# Patient Record
Sex: Female | Born: 1940 | Race: White | Hispanic: No | Smoking: Former smoker
Health system: Southern US, Community
[De-identification: ages and names within clinical notes are randomized; demographics above are authoritative.]

## PROBLEM LIST (undated history)

## (undated) DIAGNOSIS — E039 Hypothyroidism, unspecified: Secondary | ICD-10-CM

## (undated) DIAGNOSIS — H409 Unspecified glaucoma: Secondary | ICD-10-CM

## (undated) DIAGNOSIS — F419 Anxiety disorder, unspecified: Secondary | ICD-10-CM

## (undated) DIAGNOSIS — Z9889 Other specified postprocedural states: Secondary | ICD-10-CM

## (undated) DIAGNOSIS — I10 Essential (primary) hypertension: Secondary | ICD-10-CM

## (undated) DIAGNOSIS — R112 Nausea with vomiting, unspecified: Secondary | ICD-10-CM

## (undated) DIAGNOSIS — M1990 Unspecified osteoarthritis, unspecified site: Secondary | ICD-10-CM

## (undated) DIAGNOSIS — E785 Hyperlipidemia, unspecified: Secondary | ICD-10-CM

## (undated) DIAGNOSIS — K219 Gastro-esophageal reflux disease without esophagitis: Secondary | ICD-10-CM

## (undated) DIAGNOSIS — I6529 Occlusion and stenosis of unspecified carotid artery: Secondary | ICD-10-CM

## (undated) HISTORY — PX: CATARACT EXTRACTION W/ INTRAOCULAR LENS  IMPLANT, BILATERAL: SHX1307

## (undated) HISTORY — DX: Unspecified osteoarthritis, unspecified site: M19.90

## (undated) HISTORY — PX: BREAST BIOPSY: SHX20

## (undated) HISTORY — DX: Essential (primary) hypertension: I10

## (undated) HISTORY — PX: NECK MASS EXCISION: SHX2079

## (undated) HISTORY — PX: RHINOPLASTY: SUR1284

## (undated) HISTORY — PX: THYROIDECTOMY: SHX17

## (undated) HISTORY — DX: Unspecified glaucoma: H40.9

## (undated) HISTORY — DX: Hypothyroidism, unspecified: E03.9

## (undated) HISTORY — DX: Occlusion and stenosis of unspecified carotid artery: I65.29

## (undated) HISTORY — DX: Hyperlipidemia, unspecified: E78.5

## (undated) HISTORY — PX: TONSILLECTOMY: SUR1361

## (undated) HISTORY — DX: Gastro-esophageal reflux disease without esophagitis: K21.9

## (undated) HISTORY — PX: FRACTURE SURGERY: SHX138

---

## 1999-04-23 ENCOUNTER — Ambulatory Visit (HOSPITAL_COMMUNITY): Admission: RE | Admit: 1999-04-23 | Discharge: 1999-04-23 | Payer: Self-pay | Admitting: Neurosurgery

## 1999-04-23 ENCOUNTER — Encounter: Payer: Self-pay | Admitting: Neurosurgery

## 1999-05-10 ENCOUNTER — Encounter: Payer: Self-pay | Admitting: Neurosurgery

## 1999-05-10 ENCOUNTER — Ambulatory Visit (HOSPITAL_COMMUNITY): Admission: RE | Admit: 1999-05-10 | Discharge: 1999-05-10 | Payer: Self-pay | Admitting: Neurosurgery

## 1999-05-24 ENCOUNTER — Encounter: Payer: Self-pay | Admitting: Neurosurgery

## 1999-05-24 ENCOUNTER — Ambulatory Visit (HOSPITAL_COMMUNITY): Admission: RE | Admit: 1999-05-24 | Discharge: 1999-05-24 | Payer: Self-pay | Admitting: Neurosurgery

## 2000-05-05 ENCOUNTER — Encounter: Payer: Self-pay | Admitting: Neurosurgery

## 2000-05-05 ENCOUNTER — Ambulatory Visit (HOSPITAL_COMMUNITY): Admission: RE | Admit: 2000-05-05 | Discharge: 2000-05-05 | Payer: Self-pay | Admitting: Neurosurgery

## 2000-05-14 ENCOUNTER — Encounter: Payer: Self-pay | Admitting: Neurosurgery

## 2000-05-16 ENCOUNTER — Encounter: Payer: Self-pay | Admitting: Neurosurgery

## 2000-05-17 ENCOUNTER — Inpatient Hospital Stay (HOSPITAL_COMMUNITY): Admission: RE | Admit: 2000-05-17 | Discharge: 2000-05-20 | Payer: Self-pay | Admitting: Neurosurgery

## 2000-06-04 ENCOUNTER — Encounter: Payer: Self-pay | Admitting: Internal Medicine

## 2000-06-04 ENCOUNTER — Ambulatory Visit (HOSPITAL_COMMUNITY): Admission: RE | Admit: 2000-06-04 | Discharge: 2000-06-04 | Payer: Self-pay | Admitting: Internal Medicine

## 2000-06-05 ENCOUNTER — Inpatient Hospital Stay (HOSPITAL_COMMUNITY): Admission: EM | Admit: 2000-06-05 | Discharge: 2000-06-08 | Payer: Self-pay | Admitting: *Deleted

## 2000-06-07 ENCOUNTER — Encounter: Payer: Self-pay | Admitting: Family Medicine

## 2000-07-15 ENCOUNTER — Encounter (HOSPITAL_COMMUNITY): Admission: RE | Admit: 2000-07-15 | Discharge: 2000-08-14 | Payer: Self-pay | Admitting: Neurosurgery

## 2000-08-12 ENCOUNTER — Other Ambulatory Visit: Admission: RE | Admit: 2000-08-12 | Discharge: 2000-08-12 | Payer: Self-pay | Admitting: Obstetrics and Gynecology

## 2000-08-15 ENCOUNTER — Encounter: Admission: RE | Admit: 2000-08-15 | Discharge: 2000-09-14 | Payer: Self-pay | Admitting: Neurosurgery

## 2000-09-16 ENCOUNTER — Encounter (HOSPITAL_COMMUNITY): Admission: RE | Admit: 2000-09-16 | Discharge: 2000-10-16 | Payer: Self-pay | Admitting: Neurosurgery

## 2000-12-05 ENCOUNTER — Encounter: Payer: Self-pay | Admitting: Family Medicine

## 2000-12-05 ENCOUNTER — Ambulatory Visit (HOSPITAL_COMMUNITY): Admission: RE | Admit: 2000-12-05 | Discharge: 2000-12-05 | Payer: Self-pay | Admitting: Family Medicine

## 2001-07-09 ENCOUNTER — Encounter: Payer: Self-pay | Admitting: Internal Medicine

## 2001-07-09 ENCOUNTER — Ambulatory Visit (HOSPITAL_COMMUNITY): Admission: RE | Admit: 2001-07-09 | Discharge: 2001-07-09 | Payer: Self-pay | Admitting: Internal Medicine

## 2001-08-20 ENCOUNTER — Encounter (HOSPITAL_COMMUNITY): Admission: RE | Admit: 2001-08-20 | Discharge: 2001-09-19 | Payer: Self-pay | Admitting: Preventative Medicine

## 2001-11-26 ENCOUNTER — Encounter: Payer: Self-pay | Admitting: Obstetrics and Gynecology

## 2001-11-26 ENCOUNTER — Ambulatory Visit (HOSPITAL_COMMUNITY): Admission: RE | Admit: 2001-11-26 | Discharge: 2001-11-26 | Payer: Self-pay | Admitting: Obstetrics and Gynecology

## 2001-12-03 ENCOUNTER — Encounter: Payer: Self-pay | Admitting: Obstetrics and Gynecology

## 2001-12-03 ENCOUNTER — Ambulatory Visit (HOSPITAL_COMMUNITY): Admission: RE | Admit: 2001-12-03 | Discharge: 2001-12-03 | Payer: Self-pay | Admitting: Obstetrics and Gynecology

## 2002-01-27 ENCOUNTER — Ambulatory Visit (HOSPITAL_COMMUNITY): Admission: RE | Admit: 2002-01-27 | Discharge: 2002-01-27 | Payer: Self-pay | Admitting: Internal Medicine

## 2002-01-27 ENCOUNTER — Encounter: Payer: Self-pay | Admitting: Internal Medicine

## 2002-04-19 ENCOUNTER — Ambulatory Visit (HOSPITAL_COMMUNITY): Admission: RE | Admit: 2002-04-19 | Discharge: 2002-04-19 | Payer: Self-pay | Admitting: Internal Medicine

## 2002-04-19 ENCOUNTER — Encounter: Payer: Self-pay | Admitting: Internal Medicine

## 2002-07-26 ENCOUNTER — Encounter: Payer: Self-pay | Admitting: Internal Medicine

## 2002-07-26 ENCOUNTER — Ambulatory Visit (HOSPITAL_COMMUNITY): Admission: RE | Admit: 2002-07-26 | Discharge: 2002-07-26 | Payer: Self-pay | Admitting: Internal Medicine

## 2003-02-15 ENCOUNTER — Ambulatory Visit (HOSPITAL_COMMUNITY): Admission: RE | Admit: 2003-02-15 | Discharge: 2003-02-15 | Payer: Self-pay | Admitting: Obstetrics and Gynecology

## 2003-03-28 ENCOUNTER — Ambulatory Visit (HOSPITAL_COMMUNITY): Admission: RE | Admit: 2003-03-28 | Discharge: 2003-03-28 | Payer: Self-pay | Admitting: Family Medicine

## 2003-07-13 ENCOUNTER — Ambulatory Visit (HOSPITAL_COMMUNITY): Admission: RE | Admit: 2003-07-13 | Discharge: 2003-07-13 | Payer: Self-pay | Admitting: Internal Medicine

## 2003-08-15 ENCOUNTER — Ambulatory Visit (HOSPITAL_COMMUNITY): Admission: RE | Admit: 2003-08-15 | Discharge: 2003-08-15 | Payer: Self-pay | Admitting: General Surgery

## 2003-08-25 ENCOUNTER — Ambulatory Visit (HOSPITAL_COMMUNITY): Admission: RE | Admit: 2003-08-25 | Discharge: 2003-08-25 | Payer: Self-pay | Admitting: Family Medicine

## 2004-03-14 ENCOUNTER — Ambulatory Visit (HOSPITAL_COMMUNITY): Admission: RE | Admit: 2004-03-14 | Discharge: 2004-03-14 | Payer: Self-pay | Admitting: Obstetrics and Gynecology

## 2004-04-04 ENCOUNTER — Ambulatory Visit (HOSPITAL_COMMUNITY): Admission: RE | Admit: 2004-04-04 | Discharge: 2004-04-04 | Payer: Self-pay | Admitting: Internal Medicine

## 2004-04-11 ENCOUNTER — Ambulatory Visit (HOSPITAL_COMMUNITY): Admission: RE | Admit: 2004-04-11 | Discharge: 2004-04-11 | Payer: Self-pay | Admitting: Internal Medicine

## 2004-04-11 ENCOUNTER — Ambulatory Visit: Payer: Self-pay | Admitting: *Deleted

## 2004-12-12 ENCOUNTER — Ambulatory Visit: Payer: Self-pay | Admitting: *Deleted

## 2005-04-01 ENCOUNTER — Ambulatory Visit (HOSPITAL_COMMUNITY): Admission: RE | Admit: 2005-04-01 | Discharge: 2005-04-01 | Payer: Self-pay | Admitting: Obstetrics and Gynecology

## 2005-05-01 ENCOUNTER — Ambulatory Visit (HOSPITAL_COMMUNITY): Admission: RE | Admit: 2005-05-01 | Discharge: 2005-05-01 | Payer: Self-pay | Admitting: Internal Medicine

## 2006-04-03 ENCOUNTER — Ambulatory Visit (HOSPITAL_COMMUNITY): Admission: RE | Admit: 2006-04-03 | Discharge: 2006-04-03 | Payer: Self-pay | Admitting: Obstetrics and Gynecology

## 2006-04-09 ENCOUNTER — Ambulatory Visit (HOSPITAL_COMMUNITY): Admission: RE | Admit: 2006-04-09 | Discharge: 2006-04-09 | Payer: Self-pay | Admitting: Internal Medicine

## 2006-12-11 ENCOUNTER — Ambulatory Visit (HOSPITAL_BASED_OUTPATIENT_CLINIC_OR_DEPARTMENT_OTHER): Admission: RE | Admit: 2006-12-11 | Discharge: 2006-12-11 | Payer: Self-pay | Admitting: Otolaryngology

## 2007-02-24 ENCOUNTER — Other Ambulatory Visit: Admission: RE | Admit: 2007-02-24 | Discharge: 2007-02-24 | Payer: Self-pay | Admitting: Obstetrics and Gynecology

## 2007-04-06 ENCOUNTER — Ambulatory Visit (HOSPITAL_COMMUNITY): Admission: RE | Admit: 2007-04-06 | Discharge: 2007-04-06 | Payer: Self-pay | Admitting: Obstetrics and Gynecology

## 2007-04-14 ENCOUNTER — Ambulatory Visit (HOSPITAL_COMMUNITY): Admission: RE | Admit: 2007-04-14 | Discharge: 2007-04-14 | Payer: Self-pay | Admitting: Internal Medicine

## 2007-04-14 ENCOUNTER — Encounter: Payer: Self-pay | Admitting: Orthopedic Surgery

## 2007-04-30 ENCOUNTER — Ambulatory Visit: Payer: Self-pay | Admitting: Orthopedic Surgery

## 2007-04-30 DIAGNOSIS — S93409A Sprain of unspecified ligament of unspecified ankle, initial encounter: Secondary | ICD-10-CM | POA: Insufficient documentation

## 2007-07-20 ENCOUNTER — Ambulatory Visit (HOSPITAL_COMMUNITY): Admission: RE | Admit: 2007-07-20 | Discharge: 2007-07-20 | Payer: Self-pay | Admitting: Internal Medicine

## 2007-09-14 ENCOUNTER — Ambulatory Visit (HOSPITAL_COMMUNITY): Admission: RE | Admit: 2007-09-14 | Discharge: 2007-09-14 | Payer: Self-pay | Admitting: Internal Medicine

## 2007-09-15 ENCOUNTER — Ambulatory Visit (HOSPITAL_COMMUNITY): Admission: RE | Admit: 2007-09-15 | Discharge: 2007-09-15 | Payer: Self-pay | Admitting: Internal Medicine

## 2008-04-06 ENCOUNTER — Ambulatory Visit (HOSPITAL_COMMUNITY): Admission: RE | Admit: 2008-04-06 | Discharge: 2008-04-06 | Payer: Self-pay | Admitting: Obstetrics and Gynecology

## 2008-08-08 ENCOUNTER — Ambulatory Visit: Payer: Self-pay | Admitting: Vascular Surgery

## 2008-08-17 ENCOUNTER — Ambulatory Visit (HOSPITAL_COMMUNITY): Admission: RE | Admit: 2008-08-17 | Discharge: 2008-08-17 | Payer: Self-pay | Admitting: Internal Medicine

## 2008-09-02 ENCOUNTER — Ambulatory Visit: Payer: Self-pay | Admitting: Vascular Surgery

## 2008-09-06 ENCOUNTER — Other Ambulatory Visit: Admission: RE | Admit: 2008-09-06 | Discharge: 2008-09-06 | Payer: Self-pay | Admitting: Obstetrics and Gynecology

## 2009-04-13 ENCOUNTER — Ambulatory Visit (HOSPITAL_COMMUNITY): Admission: RE | Admit: 2009-04-13 | Discharge: 2009-04-13 | Payer: Self-pay | Admitting: Obstetrics and Gynecology

## 2009-09-01 ENCOUNTER — Ambulatory Visit: Payer: Self-pay | Admitting: Vascular Surgery

## 2010-03-18 ENCOUNTER — Encounter: Payer: Self-pay | Admitting: Internal Medicine

## 2010-03-18 ENCOUNTER — Encounter: Payer: Self-pay | Admitting: Family Medicine

## 2010-04-23 ENCOUNTER — Other Ambulatory Visit: Payer: Self-pay | Admitting: Obstetrics and Gynecology

## 2010-04-23 DIAGNOSIS — Z139 Encounter for screening, unspecified: Secondary | ICD-10-CM

## 2010-04-26 ENCOUNTER — Ambulatory Visit (HOSPITAL_COMMUNITY)
Admission: RE | Admit: 2010-04-26 | Discharge: 2010-04-26 | Disposition: A | Discharge status: Home / Self Care | Payer: Medicare Other | Source: Ambulatory Visit | Attending: Obstetrics and Gynecology | Admitting: Obstetrics and Gynecology

## 2010-04-26 DIAGNOSIS — Z139 Encounter for screening, unspecified: Secondary | ICD-10-CM

## 2010-04-26 DIAGNOSIS — Z1231 Encounter for screening mammogram for malignant neoplasm of breast: Secondary | ICD-10-CM | POA: Insufficient documentation

## 2010-06-15 ENCOUNTER — Ambulatory Visit: Payer: Medicare Other | Admitting: Cardiology

## 2010-07-06 ENCOUNTER — Encounter: Payer: Self-pay | Admitting: Cardiology

## 2010-07-09 ENCOUNTER — Ambulatory Visit: Payer: Medicare Other | Admitting: Cardiology

## 2010-07-10 NOTE — Consult Note (Signed)
NEW PATIENT CONSULTATION   Clark, Belinda L  DOB:  20-Nov-1940                                       09/02/2008  UJWJX#:[id number]   The patient presents today for evaluation of recent finding on a  [facility] screening of left carotid stenosis.  She is a very pleasant 70-  year-old white female with no prior history of amaurosis fugax,  transient ischemic attack, or stroke.  She does have a family history of  cardiovascular disease and underwent [facility] screening showing a  moderate left carotid stenosis.  I am seeing her for further discussion.  She had undergone repeat carotid duplex in our office on 08/08/2008 and  I discussed this with her.  She is quite relatively healthy.  Her  medical problems include elevated blood pressure and elevated  cholesterol, which are treated.  She is not a diabetic.  Does not have  any history of cardiac disease.  Does have a history of a stroke in a  brother at age 80 and heart disease in her father at age 38s.   SOCIAL HISTORY:  She is married.  She is retired.  She does not smoke,  having quit in 1995.  Does not drink alcohol.   REVIEW OF SYSTEMS:  Her weight is reported at 118 pounds, her height is  5 feet 3-1/2 inches tall.  She does have a history of asthma, esophageal  reflux, and arthritis.  She has multiple medication allergies and  multiple current medications, which are listed in her chart.   PHYSICAL EXAM:  Well-developed thin white female appearing stated age of  48.  Blood pressure is 172/80, pulse 87, respirations 18.  Her radial  pulses are 2+.  She has 2+ posterior tibial pulses bilaterally.  Her  heart is regular rate and rhythm.  Chest clear bilaterally.  Carotid  arteries are without bruits bilaterally.  She is grossly intact  neurologically.   I reviewed her duplex with her.  This shows lower end of the 60-79%  range of stenosis in the left internal carotid artery.  She has no  significant stenosis in  the right internal carotid artery.  I reviewed  symptoms of left carotid stenosis with the patient and her husband, and  they know to report immediately should these occur.  Otherwise, we will  see her in 1 year and repeat duplex followup.   Larina Earthly, M.D.  Electronically Signed   TFE/MEDQ  D:  09/02/2008  T:  09/05/2008  Job:  [id number]   cc:   Madelin Rear. Sherwood Gambler, MD

## 2010-07-10 NOTE — Procedures (Signed)
CAROTID DUPLEX EXAM   INDICATION:  Carotid disease.   HISTORY:  Diabetes:  No.  Cardiac:  No.  Hypertension:  Yes.  Smoking:  Previous.  Previous Surgery:  No.  CV History:  Currently asymptomatic.  Amaurosis Fugax No, Paresthesias No, Hemiparesis No                                       RIGHT             LEFT  Brachial systolic pressure:         158               164  Brachial Doppler waveforms:         Normal            Normal  Vertebral direction of flow:        Antegrade         Antegrade  DUPLEX VELOCITIES (cm/sec)  CCA peak systolic                   103               90  ECA peak systolic                   205               99  ICA peak systolic                   87                203  ICA end diastolic                   25                50  PLAQUE MORPHOLOGY:                  Calcific          Calcific  PLAQUE AMOUNT:                      Mild              Moderate  PLAQUE LOCATION:                    ICA / ECA         ICA / ECA   IMPRESSION:  1. No hemodynamically significant stenosis of the right proximal      internal carotid artery noted.  2. Doppler velocity suggests a high end 40%-59% stenosis of the left      proximal internal carotid artery.  3. Right external carotid artery stenosis noted.  4. No significant change in the Doppler velocities of the bilateral      carotid arteries when compared to the previous exam on 08/08/2008.   ___________________________________________  Larina Earthly, M.D.   CH/MEDQ  D:  09/01/2009  T:  09/02/2009  Job:  [id number]

## 2010-07-10 NOTE — Op Note (Signed)
Belinda Clark, Belinda Clark              ACCOUNT NO.:  [id number]   MEDICAL RECORD NO.:  [id number]          PATIENT TYPE:  AMB   LOCATION:  DSC                          FACILITY:  [facility]   PHYSICIAN:  Suzanna Obey, M.D.       DATE OF BIRTH:  [birth year]   DATE OF PROCEDURE:  12/11/2006  DATE OF DISCHARGE:  12/11/2006                               OPERATIVE REPORT   PREOPERATIVE DIAGNOSIS:  Deviated septum and turbinate hypertrophy.   POSTOPERATIVE DIAGNOSIS:  Deviated septum and turbinate hypertrophy.   SURGICAL PROCEDURE:  Septoplasty and submucous resection of inferior  turbinates.   ANESTHESIA:  General.   ESTIMATED BLOOD LOSS:  Less than 5 mL.   INDICATION:  This 70 year old has had nasal obstructions, has been  refractory to medical therapy.  She was informed of the risks and  benefits as well as options.  All questions were answered and consent  was obtained.   OPERATION:  The patient was taken to the operating room and placed in  the supine position.  After adequate general endotracheal tube  anesthesia, was placed in the supine position, draped in the usual  sterile manner.  The oxymetazoline pledgets were placed into the nose in  the septum and inferior turbinates were injected with 1% lidocaine with  1:100,000 epinephrine.  A right hemitransfixion incision was performed,  raising the mucoperichondrial and ostial flap.  The cartilage was  divided about 2 cm posterior to the caudal strut and the deviated  portion of the cartilage and bone were removed with the Jansen-Middleton  forceps.  This corrected the septal deflection.  The turbinates were  infractured.  A midline incision made with a 15 blade.  The mucosal flap  elevated superiorly and the inferior mucosa and bone were removed with  the turbinate scissors.  The edge was cauterized with suction cautery  and the flap was laid back down over the raw surface and both turbinates  outfractured.  Hemitransfixion incision closed  with an interrupted 4-0  chromic and a 4-0 plain gut placed through the septum.  Telfa rolls  soaked in bacitracin were placed into the nose bilaterally and secured  with a 3-0 nylon.  The patient was awakened and brought to recovery in  stable condition, counts correct.           ______________________________  Suzanna Obey, M.D.     JB/MEDQ  D:  12/11/2006  T:  12/11/2006  Job:  [id number]

## 2010-07-10 NOTE — Procedures (Signed)
CAROTID DUPLEX EXAM   INDICATION:  Follow up [facility] screen.   HISTORY:  Diabetes:  No.  Cardiac:  No.  Hypertension:  Yes.  Smoking:  Previous.  Previous Surgery:  No.  CV History:  Asymptomatic.  Amaurosis Fugax No, Paresthesias No, Hemiparesis No.                                       RIGHT             LEFT  Brachial systolic pressure:         162               160  Brachial Doppler waveforms:         Normal            Normal  Vertebral direction of flow:        Antegrade         Antegrade  DUPLEX VELOCITIES (cm/sec)  CCA peak systolic                   97                133  ECA peak systolic                   174               119  ICA peak systolic                   79                206  ICA end diastolic                   16                67  PLAQUE MORPHOLOGY:                  Calcific          Calcific  PLAQUE AMOUNT:                      Mild              Moderate/severe  PLAQUE LOCATION:                    ICA/ECA           ICA/ECA   IMPRESSION:  1. 1-39% stenosis of the right internal carotid artery.  2. 60-79% stenosis of the left internal carotid artery.       ___________________________________________  Larina Earthly, M.D.   CH/MEDQ  D:  08/08/2008  T:  08/08/2008  Job:  [id number]   cc:   Madelin Rear. Sherwood Gambler, MD

## 2010-07-13 NOTE — Discharge Summary (Signed)
[place]. [facility]  Patient:    Belinda Clark, Belinda Clark                     MRN: [id number] Adm. Date:  [id number] Disc. 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  CLINICAL HISTORY:  The patient was admitted because of a long history of back and right leg pain.  X-rays showed that she had compromise of the L5 nerve root.  The patient wanted to go ahead with surgery.  LABORATORY DATA:  Within normal limits.  She had a white cell count which showed 10,000 with 70% neutrophils.  COURSE IN THE HOSPITAL:  The patient was taken to surgery and a right L4-5 diskectomy and foraminotomy were done.  The patient did really well and she was ready to go home, but she developed some low-grade fever.  It was decided to keep her overnight.  Today she is feeling much better.  She has been afebrile for the past 24 hours.  The white cells showed 10,000 with 70 neutrophils.  Today she has been afebrile and she is ready to go home.  CONDITION ON DISCHARGE:  Improving.  DISCHARGE MEDICATIONS:  Percocet and diazepam.  DIET:  Regular.  ACTIVITY:  Not to drive until she sees me.  FOLLOW-UP:  I will see her in my office in 10 days. DD:  05/20/00 TD:  05/20/00 Job: [id number]

## 2010-07-13 NOTE — H&P (Signed)
Belinda Clark. Belinda Clark  Patient:    Belinda Clark, Belinda Clark                     MRN: [id number] Adm. Date:  [id number] Attending:  Danella Penton                         History and Physical  HISTORY OF PRESENT ILLNESS:  Belinda Clark is a lady who was seen by me initially at the end of February 2002 because of chronic back pain with radiation down to the right leg all the way down to the right foot.  The patient had been seen by the chiropractor, had conservative treatment, and in view of no improvement we decided to go ahead with a cervical/lumbar myelogram because she was complaining of some cervical pain.  Because of the findings, we decided to go ahead with surgery.  She is not any better and yesterday I brought her in, as well as her husband, and we talked about surgery.  Both of them want to go ahead with surgery.  PAST MEDICAL HISTORY:  Two breast biopsies, thyroidectomy.  ALLERGIES:  CEFTIN, CODEINE, and SULFA.  SOCIAL HISTORY:  Patient does not smoke, does not drink.  FAMILY HISTORY:  History of diabetes, high blood pressure in the family.  REVIEW OF SYSTEMS:  Positive for a history of urinary incontinence, high blood pressure, back pain.  PHYSICAL EXAMINATION:  GENERAL:  The patient came to my office and she was limping from the right leg.  HEENT:  Normal.  NECK:  Normal.  LUNGS:  Clear.  HEART:  Heart sounds normal.  ABDOMEN:  Normal.  EXTREMITIES:  Normal pulses.  NEUROLOGIC:  Mental status normal.  Cranial nerves normal.  Strength is 5/5 except in the right foot, which she has 4/5 weakness.  There is no atrophy. Reflexes 2+, no Babinski.  Straight leg raising - left side negative at 90 degrees, right side positive at 70 degrees.  She had difficulty walking with tiptoes and heels - this is more in the right foot.  LABORATORY DATA:  The myelogram showed that, indeed, she has a ______ mostly at the level of 4-5 on the right side,  associated with a bulging disk.  CLINICAL IMPRESSION: 1. Right L5 radiculopathy secondary to ______ and a herniated disk. 2. The cervical myelogram was normal.  RECOMMENDATIONS:  The patient is being admitted for surgery.  She knows that the procedure will be a right L4 and 5 foraminotomy and probable diskectomy. She knows about the risks such as infection, CSF leak, worsening of the pain, paralysis, need of further surgery. DD:  05/16/00 TD:  05/17/00 Job: [id number]

## 2010-07-13 NOTE — Op Note (Signed)
[place]. [facility]  Patient:    Belinda Clark, Belinda Clark                       MRN: [id number] Proc. Date: 05/16/00 Attending:  Tanya Nones. Jeral Fruit, M.D.                           Operative Report  PREOPERATIVE DIAGNOSIS:  Right L5 chronic radiculopathy secondary to hypertrophied facet and herniated disk.  POSTOPERATIVE DIAGNOSIS:  Right L5 chronic radiculopathy secondary to hypertrophied facet and herniated disk.  PROCEDURES: 1. Right L4-5 diskectomy, foraminotomy, with decompression of the L4 and L5    nerve roots. 2. Lysis of adhesions. 3. Microscope.  SURGEON:  Tanya Nones. Jeral Fruit, M.D.  ASSISTANT:  Cristi Loron, M.D.  CLINICAL HISTORY:  Ms. Direnzo is a lady who has been complaining of back and right leg pain for many years.  She has weakness of the dorsiflexors of the right foot.  She has failed with conservative treatment.  X-rays showed that indeed she has a diffuse herniated disk with hypertrophy of the facet, right worse than the left one.  Surgery was advised.  The patient knew of the risks, such as infection, CSF leak, worsening of the pain, paralysis, and need for further surgery.  Also, she knew that because of the chronicity of the problem that she might not get a lot of improvement because of probable nerve compromise chronically.  DESCRIPTION OF PROCEDURE:  The patient was taken to the OR.  She was positioned in a prone manner.  The back was prepped with Betadine.  A midline incision from L4 to L5 was done.  X-rays showed that indeed we were at that area.  Then with the drill, we drilled the lower level of L4 and the upper of L5.  We investigated the facet, and the facet was twice as big.  We did a medial facetectomy, leaving plenty of facet behind.  We brought the microscope into the area, and we removed the yellow ligament.  We started doing the dissection.  What we found, indeed, was that both L4 and L5 nerve roots were narrow, were  going through narrow foramina.  The foraminotomy to decompress the L5 nerve root was done first.  Then when we reached the dural sac, we found that indeed there was a large herniated disk, but also we found quite a bit of adhesion compromising the L4 nerve root.  Because of that, we did a lysis of adhesions.  The dura mater right at the level of the takeoff of L4 was released.  After having the dissection, we were able to mobilize the thecal sac without any problem.  Indeed, there was a large herniated disk affecting the takeoff of L5.  Incision was made, and total gross diskectomy with removal of a large degenerative disk was accomplished.  Having done this, we investigated again the foramen.  There was plenty of room for the L4 and L5 nerve roots.  We did a Valsalva maneuver, although we did not see any CSF coming.  Nevertheless, we proceeded to putting a Tisseel in the arachnoid space, _____ the dural sac, the L4, and L5 nerve root.  Having done this, fat was left on top of the area.  The area was irrigated.  The wound was closed with Vicryl and nylon.  The patient did well. DD:  05/16/00 TD:  05/17/00 Job: [id number]

## 2010-07-13 NOTE — Procedures (Signed)
NAMEKOOPER, CHRISWELL NO.:  [id number]   MEDICAL RECORD NO.:  [id number]          PATIENT TYPE:  OUT   LOCATION:  DSC                           FACILITY:  [facility]   PHYSICIAN:  Vida Roller, M.D.   DATE OF BIRTH:  10/23/1940   DATE OF PROCEDURE:  04/11/2004  DATE OF DISCHARGE:                                  ECHOCARDIOGRAM   TAPE NUMBER:  [id number].   TAPE COUNT:  [id number] - [id number].   HISTORY OF PRESENT ILLNESS:  This is a 70 year old woman with chest pain and  shortness of breath.   TECHNICAL QUALITY:  Slightly difficult and the endocardial definition was  not completely adequate.   M-MODE TRACINGS:  The aorta is 27 mm.   Left atrium is 36 mm.   The septum is 10 mm.   Posterior wall is 10 mm.   Left ventricular diastolic dimension is 41 mm.   Left ventricular systolic dimension is 30 mm.   2-D AND DOPPLER IMAGING:  The left ventricle is normal size with normal  systolic function. Estimated ejection fraction 55% to 60%. There were no  obvious wall motion abnormalities although it was difficult to assess. No  left ventricular hypertrophy seen. The right ventricle is top normal in  size. There is mild right atrial enlargement. Left atrium appears to be  normal size.   The aortic valve is sclerotic with no evidence of stenosis or regurgitation.   The mitral valve has trace mitral regurgitation. No stenosis is seen.   The tricuspid valve has trace regurgitation.   Pulmonic valve not well seen.   No pericardial effusion.   Inferior vena cava appears to be normal size.   The ascending aorta not well seen.      JH/MEDQ  D:  04/12/2004  T:  04/12/2004  Job:  [id number]

## 2010-07-13 NOTE — H&P (Signed)
NAME:  Belinda Clark, Belinda Clark                        ACCOUNT NO.:  [id number]   MEDICAL RECORD NO.:  [id number]                   PATIENT TYPE:  OUT   LOCATION:  [facility]                                  FACILITY:  [facility]   PHYSICIAN:  Dalia Heading, M.D.               DATE OF BIRTH:  Feb 04, 1941   DATE OF ADMISSION:  08/11/2003  DATE OF DISCHARGE:                                HISTORY & PHYSICAL   CHIEF COMPLAINT:  Need for screening colonoscopy.   HISTORY OF PRESENT ILLNESS:  The patient is a 70 year old white female who  is referred for endoscopic evaluation.  She needs a colonoscopy for  screening purposes.  She has never had a colonoscopy.  There is no history  of abdominal pain, weight loss, nausea, vomiting, diarrhea, constipation,  melena or hematochezia.  No family history of colon carcinoma.   PAST MEDICAL HISTORY:  1. Hypothyroidism.  2. Hypertension.  3. Reflux disease.   PAST SURGICAL HISTORY:  1. Spinal surgery.  2. Multiple breast biopsies.  3. Thyroidectomy.   CURRENT MEDICATIONS:  1. Baby aspirin.  2. Actonel.  3. Synthroid.  4. Toprol.  5. Nexium.  6. Hydrochlorothiazide.  7. Vitamins.  8. Fish oil.   ALLERGIES:  CEFTIN, SULFA, CODEINE.   REVIEW OF SYSTEMS:  The patient denies drinking or smoking.   PHYSICAL EXAMINATION:  GENERAL APPEARANCE:  Well-developed, well-nourished  white female in no acute distress.  VITAL SIGNS:  Afebrile and vital signs are stable.  LUNGS:  Clear to auscultation with good breath sounds bilaterally.  HEART:  Regular rate and rhythm without S3, S4 or murmurs.  ABDOMEN:  Soft, nontender, nondistended.  No hepatosplenomegaly or masses  are noted.  RECTAL:  Deferred to the procedure.   IMPRESSION:  Need for screening colonoscopy.   PLAN:  The patient is scheduled for a colonoscopy on August 15, 2003.  Risks  and benefits of procedure including bleeding and perforation were fully  explained to the patient.  Gave informed  consent.     ___________________________________________                                         Dalia Heading, M.D.   MAJ/MEDQ  D:  08/11/2003  T:  08/11/2003  Job:  [id number]   cc:   Madelin Rear. Sherwood Gambler, M.D.  [address]  [place]  [place] [address]  Fax: [phone]

## 2010-07-26 ENCOUNTER — Ambulatory Visit: Payer: Medicare Other | Admitting: Cardiology

## 2010-07-27 ENCOUNTER — Ambulatory Visit: Payer: Medicare Other | Admitting: Cardiology

## 2010-08-28 ENCOUNTER — Ambulatory Visit: Payer: Self-pay | Admitting: Vascular Surgery

## 2010-08-28 ENCOUNTER — Other Ambulatory Visit: Payer: Self-pay

## 2010-09-18 ENCOUNTER — Encounter: Payer: Self-pay | Admitting: Cardiology

## 2010-09-21 ENCOUNTER — Ambulatory Visit: Payer: Medicare Other | Admitting: Cardiology

## 2010-09-25 ENCOUNTER — Ambulatory Visit: Payer: Self-pay | Admitting: Vascular Surgery

## 2010-09-25 ENCOUNTER — Other Ambulatory Visit: Payer: Self-pay

## 2010-10-01 ENCOUNTER — Encounter: Payer: Self-pay | Admitting: Vascular Surgery

## 2010-10-01 ENCOUNTER — Encounter: Payer: Self-pay | Admitting: Cardiology

## 2010-10-02 ENCOUNTER — Other Ambulatory Visit (INDEPENDENT_AMBULATORY_CARE_PROVIDER_SITE_OTHER): Payer: Medicare Other

## 2010-10-02 ENCOUNTER — Encounter: Payer: Self-pay | Admitting: Vascular Surgery

## 2010-10-02 ENCOUNTER — Ambulatory Visit (INDEPENDENT_AMBULATORY_CARE_PROVIDER_SITE_OTHER): Payer: Medicare Other | Admitting: Vascular Surgery

## 2010-10-02 VITALS — BP 151/83

## 2010-10-02 DIAGNOSIS — I6529 Occlusion and stenosis of unspecified carotid artery: Secondary | ICD-10-CM

## 2010-10-02 NOTE — Progress Notes (Signed)
Subjective:     Patient ID: Belinda Clark, female   DOB: [birth year], 70 y.o.   MRN: [id number]  HPI  The patient presents today for followup of her asymptomatic carotid stenosis. He is right-handed. She denies any neurologic deficits. Review of Systems No change.    Past Medical History  Diagnosis Date  . HTN (hypertension)   . GERD (gastroesophageal reflux disease)   . Hypothyroidism   . High blood pressure   . Arthritis   . Asthma   . Carotid artery stenosis     40-59 % -Left    History  Substance Use Topics  . Smoking status: Former Smoker    Quit date: 02/25/1993  . Smokeless tobacco: Never Used  . Alcohol Use: No    Family History  Problem Relation Age of Onset  . Heart disease    . Diabetes      Allergies  Allergen Reactions  . Cefuroxime Axetil   . Clarithromycin   . Cleocin (Clindamycin Hcl)   . Codeine   . Cymbalta (Duloxetine Hcl)   . Doxycycline   . Duloxetine   . Iohexol      Desc: hives   . Levofloxacin   . Metoclopramide Hcl   . Sulfonamide Derivatives     Current outpatient prescriptions:aspirin 81 MG tablet, Take 81 mg by mouth daily. , Disp: , Rfl: ;  Atorvastatin Calcium (LIPITOR PO), Take 40 mg by mouth daily. , Disp: , Rfl: ;  calcium carbonate (OS-CAL) 600 MG TABS,  , Disp: , Rfl: ;  Cholecalciferol (VITAMIN D3) 400 UNITS CAPS,  , Disp: , Rfl: ;  diltiazem (CARDIZEM) 120 MG tablet, , Disp: , Rfl: ;  ENALAPRIL MALEATE PO, Take 20 mg by mouth daily. , Disp: , Rfl:  Esomeprazole Magnesium (NEXIUM PO), 40 mg daily. , Disp: , Rfl: ;  Garlic 1000 MG CAPS, Take by mouth.  , Disp: , Rfl: ;  hydrochlorothiazide 25 MG tablet, Take 25 mg by mouth daily.  , Disp: , Rfl: ;  levothyroxine (SYNTHROID, LEVOTHROID) 100 MCG tablet, Take 100 mcg by mouth daily. , Disp: , Rfl: ;  METOPROLOL SUCCINATE PO, Take 25 mg by mouth daily. , Disp: , Rfl:  montelukast (SINGULAIR) 10 MG tablet, Take 30 mg by mouth at bedtime.  , Disp: , Rfl: ;  Multiple Vitamin  (MULTIVITAMIN) capsule,  , Disp: , Rfl: ;  Omega-3 Fatty Acids (OMEGA 3 PO),  , Disp: , Rfl:   There were no vitals filed for this visit.  There is no height or weight on file to calculate BMI.       Objective:   Physical Exam Well-developed well-nourished white female in no acute distress. Carotid artery without bruits bilaterally. 2+ radial pulses bilaterally. Neurologically intact.  carotid duplex exam: No change in the left 40-59% internal carotid artery stenosis, no significant stenosis and right carotid artery.    Assessment:     Asymptomatic moderate left internal carotid artery stenosis    Plan:     Yearly followup carotid duplex exam. The patient will notify us for any neurologic deficits.

## 2010-10-15 NOTE — Procedures (Unsigned)
CAROTID DUPLEX EXAM  INDICATION:  Carotid disease.  HISTORY: Diabetes:  No. Cardiac:  No. Hypertension:  Yes. Smoking:  Previous. Previous Surgery:  No. CV History:  Currently asymptomatic. Amaurosis Fugax No, Paresthesias No, Hemiparesis No.                                      RIGHT             LEFT Brachial systolic pressure:         148               152 Brachial Doppler waveforms:         Normal            Normal Vertebral direction of flow:        Antegrade         Antegrade DUPLEX VELOCITIES (cm/sec) CCA peak systolic                   97                99 ECA peak systolic                   132               75 ICA peak systolic                   72                169 ICA end diastolic                   28                50 PLAQUE MORPHOLOGY:                  Calcific          Calcific PLAQUE AMOUNT:                      Mild              Moderate PLAQUE LOCATION:                    ICA, ECA          ICA, ECA  IMPRESSION: 1. No hemodynamically significant stenosis of the right internal     carotid artery. 2. Left internal carotid artery velocities suggest 40% to 59%     stenosis. 3. Essentially stable in comparison to the previous examination.  ___________________________________________ Larina Earthly, M.D.  EM/MEDQ  D:  10/02/2010  T:  10/02/2010  Job:  [id number]

## 2010-12-05 LAB — BASIC METABOLIC PANEL
CO2: 28
Calcium: 9.1
GFR calc Af Amer: >60
GFR calc non Af Amer: >60
Sodium: 136

## 2010-12-05 LAB — HEPATIC FUNCTION PANEL
ALT: 18
AST: 23
Bilirubin, Direct: <0.1
Total Bilirubin: 0.5

## 2011-01-04 ENCOUNTER — Encounter: Payer: Self-pay | Admitting: Cardiology

## 2011-01-04 ENCOUNTER — Ambulatory Visit (INDEPENDENT_AMBULATORY_CARE_PROVIDER_SITE_OTHER): Payer: Medicare Other | Admitting: Cardiology

## 2011-01-04 VITALS — BP 148/70 | HR 76 | Ht 63.0 in | Wt 114.0 lb

## 2011-01-04 DIAGNOSIS — I10 Essential (primary) hypertension: Secondary | ICD-10-CM

## 2011-01-04 DIAGNOSIS — I2510 Atherosclerotic heart disease of native coronary artery without angina pectoris: Secondary | ICD-10-CM | POA: Insufficient documentation

## 2011-01-04 DIAGNOSIS — I739 Peripheral vascular disease, unspecified: Secondary | ICD-10-CM | POA: Insufficient documentation

## 2011-01-04 MED ORDER — ENALAPRIL MALEATE 20 MG PO TABS: 40.0000 mg | ORAL_TABLET | Freq: Every day | ORAL | Status: DC

## 2011-01-04 NOTE — Progress Notes (Signed)
HPI Belinda Clark referred today by Dr. Sherwood Gambler for the evaluation and management of coronary disease.  In 2005, in [place], she was told she had plaque in her arteries. It sounds like she may have had an ultrafast CT. She underwent a stress test which was negative for ischemia. I do not have those records but she is fairly certain. She also has carotid disease and is followed by Dr. Arbie Cookey on annual basis.  She has multiple cardiac risk factors including hypertension, currently on 4 different meds. She quit smoking in the past did smoke for a number of years. He has hyperlipidemia being treated with a statin. She is not diabetic. She is active and slender.  Medications reviewed and she is on a very good secondary preventative strategy.  She denies exertional chest pain or angina. She denies orthopnea, PND or edema. She denies palpitations but she's had these in the past. It isn't associated with anxiety her outside records. Past Medical History  Diagnosis Date  . HTN (hypertension)   . GERD (gastroesophageal reflux disease)   . Hypothyroidism   . High blood pressure   . Arthritis   . Asthma   . Carotid artery stenosis     40-59 % -Left    Past Surgical History  Procedure Date  . Breast biopsy     x3  . Spine surgery   . Rhinoplasty   . Thyroidectomy   . Tonsillectomy     Family History  Problem Relation Age of Onset  . Heart disease    . Diabetes      History   Social History  . Marital Status: Married    Spouse Name: N/A    Number of Children: N/A  . Years of Education: N/A   Occupational History  . Not on file.   Social History Main Topics  . Smoking status: Former Smoker    Quit date: 12/26/1993  . Smokeless tobacco: Never Used  . Alcohol Use: No  . Drug Use: No  . Sexually Active: Not on file   Other Topics Concern  . Not on file   Social History Narrative  . No narrative on file    Allergies  Allergen Reactions  . Cefuroxime Axetil   .  Clarithromycin   . Cleocin (Clindamycin Hcl)   . Codeine   . Cymbalta (Duloxetine Hcl)   . Doxycycline   . Duloxetine   . Iohexol      Desc: hives   . Levofloxacin   . Metoclopramide Hcl   . Sulfonamide Derivatives     Current Outpatient Prescriptions  Medication Sig Dispense Refill  . alendronate (FOSAMAX) 70 MG tablet weekly      . amLODipine (NORVASC) 5 MG tablet Take 5 mg by mouth daily.        . Ascorbic Acid (VITAMIN C) 1000 MG tablet Take 1,000 mg by mouth daily.        Marland Kitchen aspirin 81 MG tablet Take 81 mg by mouth daily.       . Atorvastatin Calcium (LIPITOR PO) Take 40 mg by mouth daily.       . B Complex-C (SUPER B COMPLEX PO) Take by mouth daily.        . calcium carbonate (OS-CAL) 600 MG TABS        . Cholecalciferol (VITAMIN D3) 400 UNITS CAPS        . Esomeprazole Magnesium (NEXIUM PO) 40 mg daily.       . Garlic 1000 MG  CAPS Take by mouth.        . hydrochlorothiazide 25 MG tablet Take 25 mg by mouth daily.        Marland Kitchen levothyroxine (SYNTHROID, LEVOTHROID) 100 MCG tablet Take 100 mcg by mouth daily.       Marland Kitchen METOPROLOL SUCCINATE PO Take 25 mg by mouth daily.       . montelukast (SINGULAIR) 10 MG tablet Take 30 mg by mouth at bedtime.        . Multiple Vitamin (MULTIVITAMIN) capsule        . Omega-3 Fatty Acids (OMEGA 3 PO)        . omeprazole (PRILOSEC) 20 MG capsule Take 1 tablet by mouth Daily.      Marland Kitchen PROAIR HFA 108 (90 BASE) MCG/ACT inhaler as directed.      . vitamin E 400 UNIT capsule Take 400 Units by mouth daily.        . enalapril (VASOTEC) 20 MG tablet Take 2 tablets (40 mg total) by mouth daily.  180 tablet  3    ROS Negative other than HPI.   PE General Appearance: well developed, well nourished in no acute distress HEENT: symmetrical face, PERRLA, good dentition  Neck: no JVD, thyromegaly, or adenopathy, trachea midline Chest: symmetric without deformity Cardiac: PMI non-displaced, RRR, normal S1, S2, no gallop or murmur Lung: clear to ausculation  and percussion Vascular: carotid upstrokes normal, bilateral bruits, left greater than right Abdominal: nondistended, nontender, good bowel sounds, no HSM, no bruits Extremities: no cyanosis, clubbing or edema, no sign of DVT, no varicosities  Skin: normal color, no rashes Neuro: alert and oriented x 3, non-focal Pysch: normal affect Filed Vitals:   01/04/11 1604  BP: 148/70  Pulse: 76  Height: 5\' 3"  (1.6 m)  Weight: 114 lb (51.71 kg)    EKG Normal sinus rhythm, poor R-wave progression and, no ST segment changes Labs and Studies Reviewed.   Lab Results  Component Value Date   HGB 15.2* 12/11/2006      Chemistry      Component Value Date/Time   NA 136 12/10/2006 0930   K 3.6 12/10/2006 0930   CL 102 12/10/2006 0930   CO2 28 12/10/2006 0930   BUN 14 12/10/2006 0930   CREATININE 0.50 12/10/2006 0930      Component Value Date/Time   CALCIUM 9.1 12/10/2006 0930   ALKPHOS 40 12/10/2006 0930   AST 23 12/10/2006 0930   ALT 18 12/10/2006 0930   BILITOT 0.5 12/10/2006 0930       No results found for this basename: CHOL   No results found for this basename: HDL   No results found for this basename: LDLCALC   No results found for this basename: TRIG   No results found for this basename: CHOLHDL   No results found for this basename: HGBA1C   Lab Results  Component Value Date   ALT 18 12/10/2006   AST 23 12/10/2006   ALKPHOS 40 12/10/2006   BILITOT 0.5 12/10/2006   No results found for this basename: TSH

## 2011-01-04 NOTE — Patient Instructions (Addendum)
Your physician has recommended you make the following change in your medication:   Increase Enalapril  Follow-up with Dr. Sherwood Gambler regarding your blood pressure.  Your physician recommends that you schedule a follow-up appointment in:  As needed with Dr. Daleen Squibb.

## 2011-01-08 ENCOUNTER — Other Ambulatory Visit (HOSPITAL_COMMUNITY): Payer: Self-pay | Admitting: Internal Medicine

## 2011-01-08 DIAGNOSIS — Z139 Encounter for screening, unspecified: Secondary | ICD-10-CM

## 2011-01-08 NOTE — Assessment & Plan Note (Signed)
I suspect she has inadequate control. I have increased her enalapril to 40 mg per day. He sees Dr. Sherwood Gambler early Next week for blood work. Goals and parameters outlined. I strongly encouraged her to use her cuff at home and keep her record.

## 2011-01-08 NOTE — Assessment & Plan Note (Signed)
With her history of carotid disease and multiple risk factors, she clearly has subclinical coronary artery disease. She was surprised by this. It is a major component of denial as I openly addressed with her  At the present time, aggressive secondary prevention is appropriate we talked about angina and ischemic symptoms and how to respond appropriately. She will continue to follow along with vascular surgery about her carotid disease. I will see her back p.r.n.

## 2011-01-14 ENCOUNTER — Other Ambulatory Visit (HOSPITAL_COMMUNITY): Payer: Medicare Other

## 2011-01-15 ENCOUNTER — Ambulatory Visit (HOSPITAL_COMMUNITY)
Admission: RE | Admit: 2011-01-15 | Discharge: 2011-01-15 | Disposition: A | Discharge status: Home / Self Care | Payer: Medicare Other | Source: Ambulatory Visit | Attending: Internal Medicine | Admitting: Internal Medicine

## 2011-01-15 DIAGNOSIS — Z13820 Encounter for screening for osteoporosis: Secondary | ICD-10-CM | POA: Insufficient documentation

## 2011-01-15 DIAGNOSIS — Z780 Asymptomatic menopausal state: Secondary | ICD-10-CM | POA: Insufficient documentation

## 2011-01-15 DIAGNOSIS — M818 Other osteoporosis without current pathological fracture: Secondary | ICD-10-CM | POA: Insufficient documentation

## 2011-01-15 DIAGNOSIS — Z139 Encounter for screening, unspecified: Secondary | ICD-10-CM

## 2011-05-06 ENCOUNTER — Other Ambulatory Visit: Payer: Self-pay | Admitting: Obstetrics and Gynecology

## 2011-05-06 DIAGNOSIS — Z139 Encounter for screening, unspecified: Secondary | ICD-10-CM

## 2011-05-07 ENCOUNTER — Ambulatory Visit (HOSPITAL_COMMUNITY)
Admission: RE | Admit: 2011-05-07 | Discharge: 2011-05-07 | Disposition: A | Discharge status: Home / Self Care | Payer: Medicare Other | Source: Ambulatory Visit | Attending: Obstetrics and Gynecology | Admitting: Obstetrics and Gynecology

## 2011-05-07 DIAGNOSIS — Z1231 Encounter for screening mammogram for malignant neoplasm of breast: Secondary | ICD-10-CM | POA: Insufficient documentation

## 2011-05-07 DIAGNOSIS — Z139 Encounter for screening, unspecified: Secondary | ICD-10-CM

## 2011-05-09 ENCOUNTER — Ambulatory Visit (HOSPITAL_COMMUNITY): Payer: Medicare Other

## 2011-06-30 HISTORY — PX: SPINE SURGERY: SHX786

## 2011-07-03 ENCOUNTER — Ambulatory Visit (HOSPITAL_COMMUNITY)
Admission: RE | Admit: 2011-07-03 | Discharge: 2011-07-03 | Disposition: A | Discharge status: Home / Self Care | Payer: Medicare Other | Source: Ambulatory Visit | Attending: Family Medicine | Admitting: Family Medicine

## 2011-07-03 ENCOUNTER — Other Ambulatory Visit (HOSPITAL_COMMUNITY): Payer: Self-pay | Admitting: Family Medicine

## 2011-07-03 DIAGNOSIS — M439 Deforming dorsopathy, unspecified: Secondary | ICD-10-CM | POA: Insufficient documentation

## 2011-07-03 DIAGNOSIS — M549 Dorsalgia, unspecified: Secondary | ICD-10-CM

## 2011-07-17 ENCOUNTER — Other Ambulatory Visit: Payer: Self-pay | Admitting: Neurosurgery

## 2011-07-17 DIAGNOSIS — M549 Dorsalgia, unspecified: Secondary | ICD-10-CM

## 2011-07-17 DIAGNOSIS — M5410 Radiculopathy, site unspecified: Secondary | ICD-10-CM

## 2011-07-18 ENCOUNTER — Other Ambulatory Visit: Payer: Medicare Other

## 2011-07-19 ENCOUNTER — Other Ambulatory Visit: Payer: Medicare Other

## 2011-07-19 ENCOUNTER — Other Ambulatory Visit: Payer: Self-pay | Admitting: Neurosurgery

## 2011-07-19 ENCOUNTER — Ambulatory Visit
Admission: RE | Admit: 2011-07-19 | Discharge: 2011-07-19 | Disposition: A | Discharge status: Home / Self Care | Payer: Medicare Other | Source: Ambulatory Visit | Attending: Neurosurgery | Admitting: Neurosurgery

## 2011-07-19 DIAGNOSIS — M549 Dorsalgia, unspecified: Secondary | ICD-10-CM

## 2011-07-19 DIAGNOSIS — M5410 Radiculopathy, site unspecified: Secondary | ICD-10-CM

## 2011-07-19 DIAGNOSIS — IMO0002 Reserved for concepts with insufficient information to code with codable children: Secondary | ICD-10-CM

## 2011-07-19 MED ORDER — IOHEXOL 180 MG/ML  SOLN
12.0000 mL | Freq: Once | INTRAMUSCULAR | Status: AC | PRN
  Administered 2011-07-19: 12 mL via INTRATHECAL

## 2011-07-19 NOTE — Progress Notes (Signed)
benadryl 50mg  10:00 am. Taken as pre med. jkl

## 2011-07-19 NOTE — Discharge Instructions (Signed)

## 2011-07-23 ENCOUNTER — Other Ambulatory Visit: Payer: Self-pay | Admitting: Neurosurgery

## 2011-07-23 ENCOUNTER — Ambulatory Visit
Admission: RE | Admit: 2011-07-23 | Discharge: 2011-07-23 | Disposition: A | Discharge status: Home / Self Care | Payer: Medicare Other | Source: Ambulatory Visit | Attending: Neurosurgery | Admitting: Neurosurgery

## 2011-07-23 DIAGNOSIS — IMO0002 Reserved for concepts with insufficient information to code with codable children: Secondary | ICD-10-CM

## 2011-07-23 NOTE — Progress Notes (Signed)
Patient ID: Belinda Clark, female   DOB: 1940/04/02, 71 y.o.   MRN: [id number]  Office/Outpatient New Patient  - Level II 08657  07/23/2011 11:34:00   Referring physician: Dr. Jeral Fruit   Primary care physician:  Dr. Sherwood Gambler   Reason for Consult/Chief Complaint:  Low back pain extending into the lower extremities bilaterally, right greater than left.   History of Present Illness:  The patient reports an increase in her typical chronic low back pain beginning several weeks ago.  This may be related to an episode during which she picked up by 25 pounds bag of cat litter.  She states that her pain began some time later that same day.  In addition to the low back pain, she relates most significantly pain extending into the right lower extremity in what appears to be an L4 and/or L5 distribution.  She also has some left-sided pain which extends into her left buttocks.  The patient has had a previous right L4 laminotomy.   She states her pain is greatest upon first waking in the morning and then it is relieved during the day despite activities.  It is intermittent, sometimes worse on the right and other times worse on the left. She stated there was weakness in the right lower extremity one point, which has significantly improved and near completely resolved.   Leg pain, metoprolol, hydrochlorothiazide, levo thyroxine, he now approval, now resolved, alendronate.   Over-the-counter vitamins.   Lorazepam for anxiety.   Demerol for pain.   Allergies:  X-ray dye, codeine, sulfa drugs, and Ceftin   Past Medical History:  Hypertension,   Surgical History:  Breast biopsies.  Rhinoplasty.  Thyroidectomy. Spinal surgery.   Social History:  The patient denies use of tobacco or alcohol.  She is married and lives with her husband.   Review of Systems:  The patient reports symptoms of indigestion and reflux.  The she denies other constitutional, cardiovascular, respiratory, neurologic,  musculoskeletal, psychiatric, and pain, or hematologic symptoms.   Exam:   Vitals:  Blood pressure 173/73.  Temperature 98.2.   There is no focal tenderness over the spinous process sees or within the paraspinous musculature.   Data Review:  I have reviewed the patient's CT myelogram 07/19/2011 at [facility] and MRI of the lumbar spine 07/10/2011 at [facility].  There is minimal stir signal associated with an inferior endplate compression fracture of L3.  The fracture appears mostly healed.  The myelogram demonstrates lateral recess narrowing bilaterally at L3-4, right greater than left.   IMPRESSIONS:   Inferior endplate L3 compression fracture appears mostly healed. It is unlikely that the patient would benefit significantly from the vertebroplasty at this time.  She does have significant lateral recess narrowing at L3-4 which could be the etiology of what appears to be a neurogenic type pain.   PLAN:   I do not recommend spinal augmentation for this growth and at this time.  Although there is minimal edema remaining subjacent to the endplate, the fracture is near completely healed.   She may benefit from treatment of discogenic radicular type pain.

## 2011-10-02 ENCOUNTER — Encounter: Payer: Self-pay | Admitting: Neurosurgery

## 2011-10-03 ENCOUNTER — Ambulatory Visit (INDEPENDENT_AMBULATORY_CARE_PROVIDER_SITE_OTHER): Payer: Medicare Other | Admitting: *Deleted

## 2011-10-03 ENCOUNTER — Ambulatory Visit (INDEPENDENT_AMBULATORY_CARE_PROVIDER_SITE_OTHER): Payer: Medicare Other | Admitting: Neurosurgery

## 2011-10-03 ENCOUNTER — Encounter: Payer: Self-pay | Admitting: Neurosurgery

## 2011-10-03 VITALS — BP 126/70 | HR 74 | Resp 16 | Ht 63.5 in | Wt 119.0 lb

## 2011-10-03 DIAGNOSIS — I6529 Occlusion and stenosis of unspecified carotid artery: Secondary | ICD-10-CM | POA: Insufficient documentation

## 2011-10-03 NOTE — Progress Notes (Signed)
[facility]  Carotid Office Note  CC: Annual carotid surveillance Referring Physician: Early  History of Present Illness: 71 year old patient of Dr. Arbie Cookey seen for known carotid stenosis. The patient denies signs or symptoms of CVA, TIA, amaurosis fugax or any neural deficit. The patient denies any new medical diagnoses or recent surgery. The patient is a poor historian and does appear to have significant memory problems.  Past Medical History  Diagnosis Date  . HTN (hypertension)   . GERD (gastroesophageal reflux disease)   . Hypothyroidism   . High blood pressure   . Arthritis   . Asthma   . Carotid artery stenosis     40-59 % -Left    ROS: [x]  Positive   [ ]  Denies    General: [ ]  Weight loss, [ ]  Fever, [ ]  chills Neurologic: [ ]  Dizziness, [ ]  Blackouts, [ ]  Seizure [ ]  Stroke, [ ]  "Mini stroke", [ ]  Slurred speech, [ ]  Temporary blindness; [ ]  weakness in arms or legs, [ ]  Hoarseness Cardiac: [ ]  Chest pain/pressure, [ ]  Shortness of breath at rest [ ]  Shortness of breath with exertion, [ ]  Atrial fibrillation or irregular heartbeat Vascular: [ ]  Pain in legs with walking, [ ]  Pain in legs at rest, [ ]  Pain in legs at night,  [ ]  Non-healing ulcer, [ ]  Blood clot in vein/DVT,   Pulmonary: [ ]  Home oxygen, [ ]  Productive cough, [ ]  Coughing up blood, [ ]  Asthma,  [ ]  Wheezing Musculoskeletal:  [ ]  Arthritis, [ ]  Low back pain, [ ]  Joint pain Hematologic: [ ]  Easy Bruising, [ ]  Anemia; [ ]  Hepatitis Gastrointestinal: [ ]  Blood in stool, [ ]  Gastroesophageal Reflux/heartburn, [ ]  Trouble swallowing Urinary: [ ]  chronic Kidney disease, [ ]  on HD - [ ]  MWF or [ ]  TTHS, [ ]  Burning with urination, [ ]  Difficulty urinating Skin: [ ]  Rashes, [ ]  Wounds Psychological: [ ]  Anxiety, [ ]  Depression   Social History History  Substance Use Topics  . Smoking status: Former Smoker    Quit date: 12/26/1993  . Smokeless tobacco: Never Used  . Alcohol Use:  No    Family History Family History  Problem Relation Age of Onset  . Heart disease    . Diabetes    . Diabetes Mother   . Hypertension Mother   . Heart attack Father   . Heart disease Father   . Stroke Brother   . Stroke Maternal Grandmother     Allergies  Allergen Reactions  . Cefuroxime Axetil   . Clarithromycin   . Cleocin (Clindamycin Hcl)   . Codeine   . Cymbalta (Duloxetine Hcl)   . Doxycycline   . Iohexol      Desc: hives   . Levofloxacin   . Metoclopramide Hcl   . Sulfonamide Derivatives     Current Outpatient Prescriptions  Medication Sig Dispense Refill  . amLODipine (NORVASC) 5 MG tablet Take 5 mg by mouth daily.        . Ascorbic Acid (VITAMIN C) 1000 MG tablet Take 1,000 mg by mouth daily.        Marland Kitchen aspirin 81 MG tablet Take 81 mg by mouth daily.       . Atorvastatin Calcium (LIPITOR PO) Take 40 mg by mouth daily.       Marland Kitchen azithromycin (ZITHROMAX) 250 MG tablet daily.      . B Complex-C (SUPER B COMPLEX PO) Take by  mouth daily.        . calcium carbonate (OS-CAL) 600 MG TABS        . Cholecalciferol (VITAMIN D3) 400 UNITS CAPS        . enalapril (VASOTEC) 20 MG tablet Take 2 tablets (40 mg total) by mouth daily.  180 tablet  3  . Garlic 1000 MG CAPS Take by mouth.        . hydrochlorothiazide 25 MG tablet Take 25 mg by mouth daily.        Marland Kitchen ibandronate (BONIVA) 150 MG tablet Every month.      . levothyroxine (SYNTHROID, LEVOTHROID) 100 MCG tablet Take 1.25 mcg by mouth daily.       Marland Kitchen LORazepam (ATIVAN) 0.5 MG tablet as needed.      . meperidine (DEMEROL) 50 MG tablet as needed.      Marland Kitchen METOPROLOL SUCCINATE PO Take 25 mg by mouth daily.       . montelukast (SINGULAIR) 10 MG tablet Take 30 mg by mouth at bedtime.        . Multiple Vitamin (MULTIVITAMIN) capsule        . Omega-3 Fatty Acids (OMEGA 3 PO)        . omeprazole (PRILOSEC) 20 MG capsule Take 1 tablet by mouth Daily.      Marland Kitchen PROAIR HFA 108 (90 BASE) MCG/ACT inhaler as directed.      . vitamin E  400 UNIT capsule Take 400 Units by mouth daily.        Marland Kitchen alendronate (FOSAMAX) 70 MG tablet weekly      . Esomeprazole Magnesium (NEXIUM PO) 40 mg daily.         Physical Examination  Filed Vitals:   10/03/11 1157  BP: 126/70  Pulse: 74  Resp:     Body mass index is 20.75 kg/(m^2).  General:  WDWN in NAD Gait: Normal HEENT: WNL Eyes: Pupils equal Pulmonary: normal non-labored breathing , without Rales, rhonchi,  wheezing Cardiac: RRR, without  Murmurs, rubs or gallops; Abdomen: soft, NT, no masses Skin: no rashes, ulcers noted  Vascular Exam Pulses: 3+ radial pulses bilaterally Carotid bruits: Carotid pulses to auscultation with a mild left bruit Extremities without ischemic changes, no Gangrene , no cellulitis; no open wounds;  Musculoskeletal: no muscle wasting or atrophy   Neurologic: A&O X 3; Appropriate Affect ; SENSATION: normal; MOTOR FUNCTION:  moving all extremities equally. Speech is fluent/normal  Non-Invasive Vascular Imaging CAROTID DUPLEX 10/03/2011  Right ICA 20 - 39 % stenosis Left ICA 60 - 79 % stenosis   ASSESSMENT/PLAN: Asymptomatic patient with increasing left ICA stenosis. We will follow patient up in 6 months with repeat carotid duplex. The patient's questions were encouraged and answered along with her husband. It was explained to the patient and her husband the signs and symptoms of CVA and should any of this occur she should report to the nearest emergency department.  Lauree Chandler ANP   Clinic MD: Myra Gianotti on call

## 2011-10-07 NOTE — Addendum Note (Signed)
Addended by: Sharee Pimple on: 10/07/2011 09:55 AM   Modules accepted: Orders

## 2011-12-02 ENCOUNTER — Other Ambulatory Visit (HOSPITAL_COMMUNITY): Payer: Self-pay | Admitting: Internal Medicine

## 2011-12-02 ENCOUNTER — Ambulatory Visit (HOSPITAL_COMMUNITY)
Admission: RE | Admit: 2011-12-02 | Discharge: 2011-12-02 | Disposition: A | Discharge status: Home / Self Care | Payer: Medicare Other | Source: Ambulatory Visit | Attending: Internal Medicine | Admitting: Internal Medicine

## 2011-12-02 DIAGNOSIS — S8390XA Sprain of unspecified site of unspecified knee, initial encounter: Secondary | ICD-10-CM

## 2011-12-02 DIAGNOSIS — M7989 Other specified soft tissue disorders: Secondary | ICD-10-CM | POA: Insufficient documentation

## 2011-12-02 DIAGNOSIS — M25569 Pain in unspecified knee: Secondary | ICD-10-CM | POA: Insufficient documentation

## 2011-12-31 ENCOUNTER — Other Ambulatory Visit: Payer: Self-pay | Admitting: *Deleted

## 2011-12-31 MED ORDER — ENALAPRIL MALEATE 20 MG PO TABS: 40.0000 mg | ORAL_TABLET | Freq: Every day | ORAL | Status: DC

## 2012-01-15 ENCOUNTER — Other Ambulatory Visit (HOSPITAL_COMMUNITY)
Admission: RE | Admit: 2012-01-15 | Discharge: 2012-01-15 | Disposition: A | Discharge status: Home / Self Care | Payer: Medicare Other | Source: Ambulatory Visit | Attending: Obstetrics and Gynecology | Admitting: Obstetrics and Gynecology

## 2012-01-15 DIAGNOSIS — Z1151 Encounter for screening for human papillomavirus (HPV): Secondary | ICD-10-CM | POA: Insufficient documentation

## 2012-01-15 DIAGNOSIS — Z01419 Encounter for gynecological examination (general) (routine) without abnormal findings: Secondary | ICD-10-CM | POA: Insufficient documentation

## 2012-03-31 ENCOUNTER — Ambulatory Visit: Payer: Medicare Other | Admitting: Neurosurgery

## 2012-03-31 ENCOUNTER — Ambulatory Visit: Payer: Medicare Other | Admitting: Vascular Surgery

## 2012-03-31 ENCOUNTER — Other Ambulatory Visit: Payer: Medicare Other

## 2012-04-13 ENCOUNTER — Encounter: Payer: Self-pay | Admitting: Vascular Surgery

## 2012-04-14 ENCOUNTER — Other Ambulatory Visit (INDEPENDENT_AMBULATORY_CARE_PROVIDER_SITE_OTHER): Payer: Medicare Other | Admitting: *Deleted

## 2012-04-14 ENCOUNTER — Ambulatory Visit (INDEPENDENT_AMBULATORY_CARE_PROVIDER_SITE_OTHER): Payer: Medicare Other | Admitting: Vascular Surgery

## 2012-04-14 ENCOUNTER — Encounter: Payer: Self-pay | Admitting: Vascular Surgery

## 2012-04-14 VITALS — BP 127/48 | HR 82 | Ht 63.5 in | Wt 116.3 lb

## 2012-04-14 DIAGNOSIS — I6529 Occlusion and stenosis of unspecified carotid artery: Secondary | ICD-10-CM

## 2012-04-14 NOTE — Progress Notes (Signed)
Patient has today for continued followup of asymptomatic carotid disease. She is a pleasant 72 year old female with a symptomatic carotid disease which is being followed in our office with serial ultrasound evaluation. He remains in stable health. She specifically denies any episodes of amaurosis fugax, transient ischemic attack or stroke.  Past Medical History  Diagnosis Date  . HTN (hypertension)   . GERD (gastroesophageal reflux disease)   . Hypothyroidism   . High blood pressure   . Arthritis   . Asthma   . Carotid artery stenosis     40-59 % -Left    History  Substance Use Topics  . Smoking status: Former Smoker    Quit date: 12/26/1993  . Smokeless tobacco: Never Used  . Alcohol Use: No    Family History  Problem Relation Age of Onset  . Heart disease    . Diabetes    . Diabetes Mother   . Hypertension Mother   . Hyperlipidemia Mother   . Heart attack Father   . Heart disease Father   . Hyperlipidemia Father   . Stroke Brother   . Heart disease Brother   . Heart attack Brother   . Stroke Maternal Grandmother     Allergies  Allergen Reactions  . Cefuroxime Axetil   . Clarithromycin   . Cleocin (Clindamycin Hcl)   . Codeine   . Cymbalta (Duloxetine Hcl)   . Doxycycline   . Iohexol      Desc: hives   . Levofloxacin   . Metoclopramide Hcl   . Sulfonamide Derivatives     Current outpatient prescriptions:amLODipine (NORVASC) 5 MG tablet, Take 5 mg by mouth daily.  , Disp: , Rfl: ;  Ascorbic Acid (VITAMIN C) 1000 MG tablet, Take 1,000 mg by mouth daily.  , Disp: , Rfl: ;  aspirin 81 MG tablet, Take 81 mg by mouth daily. , Disp: , Rfl: ;  Atorvastatin Calcium (LIPITOR PO), Take 40 mg by mouth daily. , Disp: , Rfl: ;  azithromycin (ZITHROMAX) 250 MG tablet, as needed. , Disp: , Rfl:  B Complex-C (SUPER B COMPLEX PO), Take by mouth daily.  , Disp: , Rfl: ;  calcium carbonate (OS-CAL) 600 MG TABS,  , Disp: , Rfl: ;  Cholecalciferol (VITAMIN D3) 400 UNITS CAPS,  ,  Disp: , Rfl: ;  enalapril (VASOTEC) 20 MG tablet, Take 2 tablets (40 mg total) by mouth daily., Disp: 180 tablet, Rfl: 3;  Garlic 1000 MG CAPS, Take by mouth.  , Disp: , Rfl: ;  hydrochlorothiazide 25 MG tablet, Take 25 mg by mouth daily.  , Disp: , Rfl:  ibandronate (BONIVA) 150 MG tablet, Every month., Disp: , Rfl: ;  levothyroxine (SYNTHROID, LEVOTHROID) 100 MCG tablet, Take 1.25 mcg by mouth daily. , Disp: , Rfl: ;  LORazepam (ATIVAN) 0.5 MG tablet, as needed., Disp: , Rfl: ;  meperidine (DEMEROL) 50 MG tablet, as needed., Disp: , Rfl: ;  METOPROLOL SUCCINATE PO, Take 25 mg by mouth daily. , Disp: , Rfl: ;  montelukast (SINGULAIR) 10 MG tablet, Take 30 mg by mouth at bedtime.  , Disp: , Rfl:  Multiple Vitamin (MULTIVITAMIN) capsule,  , Disp: , Rfl: ;  Omega-3 Fatty Acids (OMEGA 3 PO),  , Disp: , Rfl: ;  omeprazole (PRILOSEC) 20 MG capsule, Take 1 tablet by mouth Daily., Disp: , Rfl: ;  PROAIR HFA 108 (90 BASE) MCG/ACT inhaler, as directed., Disp: , Rfl: ;  vitamin E 400 UNIT capsule, Take 400 Units by mouth  daily.  , Disp: , Rfl: ;  alendronate (FOSAMAX) 70 MG tablet, weekly, Disp: , Rfl:  Esomeprazole Magnesium (NEXIUM PO), 40 mg daily. , Disp: , Rfl:   BP 127/48  Pulse 82  Ht 5' 3.5" (1.613 m)  Wt 116 lb 4.8 oz (52.753 kg)  BMI 20.28 kg/m2  SpO2 97%  Body mass index is 20.28 kg/(m^2).   Physical exam: Well developed well-nourished female in no acute distress Respirations nonlabored Neurologically she is grossly intact Pulse status 2+ radial pulses bilaterally Carotid arteries without bruits bilaterally Skin without ulcers or rashes Heart regular rate and rhythm  Invasive vascular laboratory studies in our office today revealed no change in her carotid disease. She does have 60-79% left internal carotid artery stenosis and less than 40% right internal carotid artery stenosis.  Impression and plan: Asymptomatic moderate to severe left internal carotid artery stenosis. I again reviewed  symptoms of carotid disease with amaurosis fugax transient ischemic attack and stroke. She knows to percent he emergently should this occur. Otherwise we will see her again in 6 months with continued surveillance

## 2012-04-23 ENCOUNTER — Other Ambulatory Visit: Payer: Self-pay | Admitting: *Deleted

## 2012-04-29 ENCOUNTER — Other Ambulatory Visit: Payer: Self-pay | Admitting: Obstetrics and Gynecology

## 2012-04-29 DIAGNOSIS — Z139 Encounter for screening, unspecified: Secondary | ICD-10-CM

## 2012-05-07 ENCOUNTER — Ambulatory Visit (HOSPITAL_COMMUNITY)
Admission: RE | Admit: 2012-05-07 | Discharge: 2012-05-07 | Disposition: A | Discharge status: Home / Self Care | Payer: Medicare Other | Source: Ambulatory Visit | Attending: Obstetrics and Gynecology | Admitting: Obstetrics and Gynecology

## 2012-05-07 DIAGNOSIS — Z139 Encounter for screening, unspecified: Secondary | ICD-10-CM

## 2012-05-07 DIAGNOSIS — Z1231 Encounter for screening mammogram for malignant neoplasm of breast: Secondary | ICD-10-CM | POA: Insufficient documentation

## 2012-10-07 DIAGNOSIS — R931 Abnormal findings on diagnostic imaging of heart and coronary circulation: Secondary | ICD-10-CM

## 2012-10-07 HISTORY — DX: Abnormal findings on diagnostic imaging of heart and coronary circulation: R93.1

## 2012-10-19 ENCOUNTER — Encounter: Payer: Self-pay | Admitting: Vascular Surgery

## 2012-10-20 ENCOUNTER — Ambulatory Visit (INDEPENDENT_AMBULATORY_CARE_PROVIDER_SITE_OTHER): Payer: Self-pay | Admitting: Vascular Surgery

## 2012-10-20 ENCOUNTER — Other Ambulatory Visit (INDEPENDENT_AMBULATORY_CARE_PROVIDER_SITE_OTHER): Payer: Medicare Other | Admitting: *Deleted

## 2012-10-20 ENCOUNTER — Encounter: Payer: Self-pay | Admitting: Vascular Surgery

## 2012-10-20 DIAGNOSIS — I6529 Occlusion and stenosis of unspecified carotid artery: Secondary | ICD-10-CM

## 2012-10-20 NOTE — Progress Notes (Signed)
Patient presents today for followup of her asymptomatic carotid disease. She has known moderate to severe bilateral stenosis. She today specifically denies any new episodes of amaurosis fugax transient ischemic attack or stroke. She is quite concerned in that she had a CT coronary screen which suggested a high calcium score and this suggests some potential cardiac disease. She did have a Cardiolite in 2005 which showed no evidence of reversible ischemia or scar. I had a long discussion with the patient and her husband explaining that she does have a lot of calcified plaque in her carotid arteries as well at this is not necessarily mean that there is any flow limiting issues. She is extremely active and very health conscious. He has no cardiac symptoms. She is to have a Cardiolite screen and I feel this is appropriate to put her mind that he since it's been 9 years.  Past Medical History  Diagnosis Date  . HTN (hypertension)   . GERD (gastroesophageal reflux disease)   . Hypothyroidism   . High blood pressure   . Arthritis   . Asthma   . Carotid artery stenosis     40-59 % -Left    History  Substance Use Topics  . Smoking status: Former Smoker    Quit date: 12/26/1993  . Smokeless tobacco: Never Used  . Alcohol Use: No    Family History  Problem Relation Age of Onset  . Heart disease    . Diabetes    . Diabetes Mother   . Hypertension Mother   . Hyperlipidemia Mother   . Heart attack Father   . Heart disease Father   . Hyperlipidemia Father   . Stroke Brother   . Heart disease Brother   . Heart attack Brother   . Hyperlipidemia Brother   . Stroke Maternal Grandmother     Allergies  Allergen Reactions  . Cefuroxime Axetil   . Clarithromycin   . Cleocin [Clindamycin Hcl]   . Codeine   . Cymbalta [Duloxetine Hcl]   . Doxycycline   . Iohexol      Desc: hives   . Levofloxacin   . Metoclopramide Hcl   . Sulfonamide Derivatives     Current outpatient  prescriptions:amLODipine (NORVASC) 5 MG tablet, Take 5 mg by mouth daily.  , Disp: , Rfl: ;  Ascorbic Acid (VITAMIN C) 1000 MG tablet, Take 1,000 mg by mouth daily.  , Disp: , Rfl: ;  aspirin 81 MG tablet, Take 81 mg by mouth daily. , Disp: , Rfl: ;  Atorvastatin Calcium (LIPITOR PO), Take 40 mg by mouth daily. , Disp: , Rfl: ;  azithromycin (ZITHROMAX) 250 MG tablet, as needed. , Disp: , Rfl:  B Complex-C (SUPER B COMPLEX PO), Take by mouth daily.  , Disp: , Rfl: ;  calcium carbonate (OS-CAL) 600 MG TABS,  , Disp: , Rfl: ;  Cholecalciferol (VITAMIN D3) 400 UNITS CAPS,  , Disp: , Rfl: ;  enalapril (VASOTEC) 20 MG tablet, Take 2 tablets (40 mg total) by mouth daily., Disp: 180 tablet, Rfl: 3;  Esomeprazole Magnesium (NEXIUM PO), 40 mg daily. , Disp: , Rfl: ;  Garlic 1000 MG CAPS, Take by mouth.  , Disp: , Rfl:  hydrochlorothiazide 25 MG tablet, Take 25 mg by mouth daily.  , Disp: , Rfl: ;  ibandronate (BONIVA) 150 MG tablet, Every month., Disp: , Rfl: ;  levothyroxine (SYNTHROID, LEVOTHROID) 100 MCG tablet, Take 1.25 mcg by mouth daily. , Disp: , Rfl: ;  LORazepam (ATIVAN)  0.5 MG tablet, as needed., Disp: , Rfl: ;  meperidine (DEMEROL) 50 MG tablet, as needed., Disp: , Rfl: ;  METOPROLOL SUCCINATE PO, Take 25 mg by mouth daily. , Disp: , Rfl:  montelukast (SINGULAIR) 10 MG tablet, Take 30 mg by mouth at bedtime.  , Disp: , Rfl: ;  Multiple Vitamin (MULTIVITAMIN) capsule,  , Disp: , Rfl: ;  Omega-3 Fatty Acids (OMEGA 3 PO),  , Disp: , Rfl: ;  omeprazole (PRILOSEC) 20 MG capsule, Take 1 tablet by mouth Daily., Disp: , Rfl: ;  PROAIR HFA 108 (90 BASE) MCG/ACT inhaler, as directed., Disp: , Rfl: ;  vitamin E 400 UNIT capsule, Take 400 Units by mouth daily.  , Disp: , Rfl:  alendronate (FOSAMAX) 70 MG tablet, weekly, Disp: , Rfl:   BP 142/70  Pulse 81  Ht 5' 3.5" (1.613 m)  Wt 114 lb (51.71 kg)  BMI 19.87 kg/m2  SpO2 100%  Body mass index is 19.87 kg/(m^2).   Physical exam: Well-developed well-nourished  white female no acute distress Carotid arteries without bruits bilaterally Pulse status 2+ radial 2+ femoral and 2+ dorsalis pedis pulses bilaterally Respirations equal and nonlabored Heart regular rate and rhythm Neurologically she is grossly intact  Carotid duplex today was reviewed by myself and discussed this with the patient. This does show 60-79% right internal carotid artery stenosis and 40-50% left internal carotid artery stenosis. This is unchanged from her prior exams.  Impression and plan asymptomatic carotid disease. The patient will see [place] again in 6 months with repeat carotid duplex for followup. She knows to notify us immediate should she develop any neurologic deficits

## 2012-11-04 ENCOUNTER — Ambulatory Visit (INDEPENDENT_AMBULATORY_CARE_PROVIDER_SITE_OTHER): Payer: Medicare Other | Admitting: Cardiovascular Disease

## 2012-11-04 ENCOUNTER — Encounter: Payer: Self-pay | Admitting: Cardiovascular Disease

## 2012-11-04 VITALS — BP 152/72 | HR 80 | Ht 62.5 in | Wt 114.2 lb

## 2012-11-04 DIAGNOSIS — I10 Essential (primary) hypertension: Secondary | ICD-10-CM

## 2012-11-04 DIAGNOSIS — I2510 Atherosclerotic heart disease of native coronary artery without angina pectoris: Secondary | ICD-10-CM

## 2012-11-04 DIAGNOSIS — E785 Hyperlipidemia, unspecified: Secondary | ICD-10-CM | POA: Insufficient documentation

## 2012-11-04 NOTE — Assessment & Plan Note (Signed)
Well-controlled on current medications 

## 2012-11-04 NOTE — Assessment & Plan Note (Signed)
Patient has positive cardiac risk factors and had a coronary calcium score  performed in [place] 10/07/12 that was 1483 suggesting the possibility of coronary artery disease though she is completely asymptomatic. She does have a strong family history of heart disease with a father that died at age 70 of an MI in a brother who had an MI at 84 and bypass surgery. I do not think she needs a functional study since she has no symptoms.

## 2012-11-04 NOTE — Assessment & Plan Note (Signed)
On statin therapy with recent lipid profile but a little pressure 160, LDL of 79 and HDL of 67.

## 2012-11-04 NOTE — Patient Instructions (Addendum)
Your physician wants you to follow-up in: 6 months with Dr Berry. You will receive a reminder letter in the mail two months in advance. If you don't receive a letter, please call our office to schedule the follow-up appointment.  

## 2012-11-04 NOTE — Progress Notes (Signed)
11/04/2012 Belinda Clark   12/14/1940  [id number]  Primary Physician Cassell Smiles., MD Primary Cardiologist: Runell Gess MD Roseanne Reno   HPI:  Belinda Clark is a 72 year old thin appearing married Caucasian female mother of 1 child who is referred by Dr. Carlena Sax for cardiovascular evaluation. Her crit [place] positive for 30 pack years of tobacco abuse having quit 20 years ago. History of hypertension and hyperlipidemia. She has a strong family history for disease with a father who died of an MI at age 15 a brother who had an MI in bypass surgery at age 75. She's never had a heart attack or stroke and denies chest pain or shortness of breath. She is fairly active Dances on a weekly basis without symptoms. She had a coronary calcium score performed in [place] last month that was significantly abnormal with a calcium score of 1483. Because of this she was referred for cardiovascular evaluation.   Current Outpatient Prescriptions  Medication Sig Dispense Refill  . amLODipine (NORVASC) 5 MG tablet Take 5 mg by mouth daily.        . Ascorbic Acid (VITAMIN C) 1000 MG tablet Take 1,000 mg by mouth daily.        Marland Kitchen aspirin 81 MG tablet Take 81 mg by mouth daily.       . Atorvastatin Calcium (LIPITOR PO) Take 40 mg by mouth daily.       . B Complex-C (SUPER B COMPLEX PO) Take 1 tablet by mouth daily.       . calcium carbonate (OS-CAL) 600 MG TABS Take 600 mg in the morning and 1,200 mg at night      . Cholecalciferol 1000 UNITS capsule Take 1,000 Units by mouth daily.      . enalapril (VASOTEC) 20 MG tablet Take 2 tablets (40 mg total) by mouth daily.  180 tablet  3  . Garlic 1000 MG CAPS Take 1,000 mg by mouth daily.       . hydrochlorothiazide 25 MG tablet Take 25 mg by mouth daily.        Marland Kitchen ibandronate (BONIVA) 150 MG tablet Take 150 mg by mouth every 30 (thirty) days.       Marland Kitchen levothyroxine (SYNTHROID, LEVOTHROID) 125 MCG tablet Take 125 mcg by mouth daily before  breakfast.      . LORazepam (ATIVAN) 0.5 MG tablet Take 0.25-0.5 mg by mouth as needed.       . metoprolol succinate (TOPROL-XL) 25 MG 24 hr tablet Take 25 mg by mouth daily.      . montelukast (SINGULAIR) 10 MG tablet Take 10 mg by mouth at bedtime.       . Multiple Vitamin (MULTIVITAMIN) capsule Take 1 capsule by mouth daily.       Marland Kitchen omeprazole (PRILOSEC) 20 MG capsule Take 1 tablet by mouth Daily.      Marland Kitchen PROAIR HFA 108 (90 BASE) MCG/ACT inhaler as directed.      . vitamin E 400 UNIT capsule Take 400 Units by mouth daily.        . Omega-3 Fatty Acids (OMEGA 3 PO) Take 300 mg by mouth daily.        No current facility-administered medications for this visit.    Allergies  Allergen Reactions  . Cefuroxime Axetil   . Clarithromycin   . Cleocin [Clindamycin Hcl]   . Codeine   . Cymbalta [Duloxetine Hcl]   . Doxycycline   . Iohexol  Desc: hives   . Levofloxacin   . Metoclopramide Hcl   . Sulfonamide Derivatives     History   Social History  . Marital Status: Married    Spouse Name: N/A    Number of Children: N/A  . Years of Education: N/A   Occupational History  . Not on file.   Social History Main Topics  . Smoking status: Former Smoker -- 1.00 packs/day for 30 years    Types: Cigarettes    Quit date: 12/26/1993  . Smokeless tobacco: Never Used  . Alcohol Use: No  . Drug Use: No  . Sexual Activity: Not on file   Other Topics Concern  . Not on file   Social History Narrative  . No narrative on file     Review of Systems: General: negative for chills, fever, night sweats or weight changes.  Cardiovascular: negative for chest pain, dyspnea on exertion, edema, orthopnea, palpitations, paroxysmal nocturnal dyspnea or shortness of breath Dermatological: negative for rash Respiratory: negative for cough or wheezing Urologic: negative for hematuria Abdominal: negative for nausea, vomiting, diarrhea, bright red blood per rectum, melena, or  hematemesis Neurologic: negative for visual changes, syncope, or dizziness All other systems reviewed and are otherwise negative except as noted above.    Blood pressure 152/72, pulse 80, height 5' 2.5" (1.588 m), weight 114 lb 3.2 oz (51.801 kg).  General appearance: alert and no distress Neck: no adenopathy, no JVD, supple, symmetrical, trachea midline, thyroid not enlarged, symmetric, no tenderness/mass/nodules and soft carotid bruits bilaterally Lungs: clear to auscultation bilaterally Heart: regular rate and rhythm, S1, S2 normal, no murmur, click, rub or gallop Extremities: extremities normal, atraumatic, no cyanosis or edema  EKG normal sinus rhythm at 80 without ST or T wave changes  ASSESSMENT AND PLAN:   Coronary artery disease Patient has positive cardiac risk factors and had a coronary calcium score  performed in [place] 10/07/12 that was 1483 suggesting the possibility of coronary artery disease though she is completely asymptomatic. She does have a strong family history of heart disease with a father that died at age 25 of an MI in a brother who had an MI at 39 and bypass surgery. I do not think she needs a functional study since she has no symptoms.  Hypertension Well-controlled on current medications  Hyperlipidemia On statin therapy with recent lipid profile but a little pressure 160, LDL of 79 and HDL of 67.      Runell Gess MD [facility] 11/04/2012 5:01 PM

## 2012-11-06 ENCOUNTER — Encounter: Payer: Self-pay | Admitting: Cardiovascular Disease

## 2013-03-01 ENCOUNTER — Other Ambulatory Visit (HOSPITAL_COMMUNITY): Payer: Self-pay | Admitting: Internal Medicine

## 2013-03-01 DIAGNOSIS — Z139 Encounter for screening, unspecified: Secondary | ICD-10-CM

## 2013-03-03 ENCOUNTER — Other Ambulatory Visit (HOSPITAL_COMMUNITY): Payer: Self-pay

## 2013-03-05 ENCOUNTER — Other Ambulatory Visit (HOSPITAL_COMMUNITY): Payer: Medicare Other

## 2013-03-05 ENCOUNTER — Ambulatory Visit (HOSPITAL_COMMUNITY)
Admission: RE | Admit: 2013-03-05 | Discharge: 2013-03-05 | Disposition: A | Discharge status: Home / Self Care | Payer: Medicare Other | Source: Ambulatory Visit | Attending: Internal Medicine | Admitting: Internal Medicine

## 2013-03-05 DIAGNOSIS — Z139 Encounter for screening, unspecified: Secondary | ICD-10-CM

## 2013-03-05 DIAGNOSIS — Z13820 Encounter for screening for osteoporosis: Secondary | ICD-10-CM | POA: Insufficient documentation

## 2013-03-05 DIAGNOSIS — M810 Age-related osteoporosis without current pathological fracture: Secondary | ICD-10-CM | POA: Insufficient documentation

## 2013-03-29 ENCOUNTER — Other Ambulatory Visit: Payer: Self-pay | Admitting: Vascular Surgery

## 2013-03-29 DIAGNOSIS — I6529 Occlusion and stenosis of unspecified carotid artery: Secondary | ICD-10-CM

## 2013-04-26 ENCOUNTER — Other Ambulatory Visit: Payer: Self-pay | Admitting: Obstetrics and Gynecology

## 2013-04-26 DIAGNOSIS — Z139 Encounter for screening, unspecified: Secondary | ICD-10-CM

## 2013-04-27 ENCOUNTER — Ambulatory Visit: Payer: Medicare Other | Admitting: Vascular Surgery

## 2013-05-03 ENCOUNTER — Encounter: Payer: Self-pay | Admitting: Vascular Surgery

## 2013-05-04 ENCOUNTER — Ambulatory Visit (INDEPENDENT_AMBULATORY_CARE_PROVIDER_SITE_OTHER): Payer: Medicare Other | Admitting: Vascular Surgery

## 2013-05-04 ENCOUNTER — Encounter: Payer: Self-pay | Admitting: Vascular Surgery

## 2013-05-04 ENCOUNTER — Ambulatory Visit (HOSPITAL_COMMUNITY)
Admission: RE | Admit: 2013-05-04 | Discharge: 2013-05-04 | Disposition: A | Discharge status: Home / Self Care | Payer: Medicare Other | Source: Ambulatory Visit | Attending: Vascular Surgery | Admitting: Vascular Surgery

## 2013-05-04 VITALS — BP 144/61 | HR 79 | Resp 18 | Ht 62.5 in | Wt 112.4 lb

## 2013-05-04 DIAGNOSIS — I6529 Occlusion and stenosis of unspecified carotid artery: Secondary | ICD-10-CM

## 2013-05-04 NOTE — Progress Notes (Signed)
Here today for continued followup of asymptomatic extracranial cerebrovascular occlusive disease. She specifically denies any new neuro logically deficits specifically no aphasia, amaurosis fugax or transient ischemic attacks. He is concern regarding possible coronary disease. She mentions a high calcium score and would like to have further cardiac evaluation although she has no symptoms. She will discuss this with Dr. Gwenlyn Found. He does report some right calf discomfort that is most likely related to musculoskeletal issues. He does not appear to be typical of claudication  Past Medical History  Diagnosis Date  . HTN (hypertension)   . GERD (gastroesophageal reflux disease)   . Hypothyroidism   . High blood pressure   . Arthritis   . Asthma   . Carotid artery stenosis     40-59 % -Left  . Hyperlipidemia     History  Substance Use Topics  . Smoking status: Former Smoker -- 1.00 packs/day for 30 years    Types: Cigarettes    Quit date: 12/26/1993  . Smokeless tobacco: Never Used  . Alcohol Use: No    Family History  Problem Relation Age of Onset  . Heart disease    . Diabetes    . Diabetes Mother   . Hypertension Mother   . Hyperlipidemia Mother   . Heart attack Father   . Heart disease Father   . Hyperlipidemia Father   . Stroke Brother   . Heart disease Brother   . Heart attack Brother   . Hyperlipidemia Brother   . Stroke Maternal Grandmother     Allergies  Allergen Reactions  . Cefuroxime Axetil   . Clarithromycin   . Cleocin [Clindamycin Hcl]   . Codeine   . Cymbalta [Duloxetine Hcl]   . Doxycycline   . Iohexol      Desc: hives   . Levofloxacin   . Metoclopramide Hcl   . Sulfonamide Derivatives     Current outpatient prescriptions:amLODipine (NORVASC) 5 MG tablet, Take 5 mg by mouth daily.  , Disp: , Rfl: ;  Ascorbic Acid (VITAMIN C) 1000 MG tablet, Take 1,000 mg by mouth daily.  , Disp: , Rfl: ;  aspirin 81 MG tablet, Take 81 mg by mouth daily. , Disp: , Rfl:  ;  Atorvastatin Calcium (LIPITOR PO), Take 40 mg by mouth daily. , Disp: , Rfl: ;  B Complex-C (SUPER B COMPLEX PO), Take 1 tablet by mouth daily. , Disp: , Rfl:  calcium carbonate (OS-CAL) 600 MG TABS, Take 600 mg in the morning and 1,200 mg at night, Disp: , Rfl: ;  Cholecalciferol 1000 UNITS capsule, Take 1,000 Units by mouth daily., Disp: , Rfl: ;  Garlic 2831 MG CAPS, Take 1,000 mg by mouth daily. , Disp: , Rfl: ;  hydrochlorothiazide 25 MG tablet, Take 25 mg by mouth daily.  , Disp: , Rfl: ;  ibandronate (BONIVA) 150 MG tablet, Take 150 mg by mouth every 30 (thirty) days. , Disp: , Rfl:  levothyroxine (SYNTHROID, LEVOTHROID) 125 MCG tablet, Take 125 mcg by mouth daily before breakfast., Disp: , Rfl: ;  LORazepam (ATIVAN) 0.5 MG tablet, Take 0.25-0.5 mg by mouth as needed. , Disp: , Rfl: ;  metoprolol succinate (TOPROL-XL) 25 MG 24 hr tablet, Take 25 mg by mouth daily., Disp: , Rfl: ;  montelukast (SINGULAIR) 10 MG tablet, Take 10 mg by mouth at bedtime. , Disp: , Rfl:  Multiple Vitamin (MULTIVITAMIN) capsule, Take 1 capsule by mouth daily. , Disp: , Rfl: ;  Omega-3 Fatty Acids (OMEGA 3 PO),  Take 300 mg by mouth daily. , Disp: , Rfl: ;  omeprazole (PRILOSEC) 20 MG capsule, Take 1 tablet by mouth Daily., Disp: , Rfl: ;  PROAIR HFA 108 (90 BASE) MCG/ACT inhaler, as directed., Disp: , Rfl: ;  vitamin E 400 UNIT capsule, Take 400 Units by mouth daily.  , Disp: , Rfl:  enalapril (VASOTEC) 20 MG tablet, Take 2 tablets (40 mg total) by mouth daily., Disp: 180 tablet, Rfl: 3  BP 144/61  Pulse 79  Resp 18  Ht 5' 2.5" (1.588 m)  Wt 112 lb 6.4 oz (50.984 kg)  BMI 20.22 kg/m2  Body mass index is 20.22 kg/(m^2).       On physical exam: Well-developed well-nourished female in no acute distress I do not hear any carotid bruits 2+ radial and 2+ dorsalis pedis pulses bilaterally Abdomen soft nontender no masses noted Heart regular rate and rhythm without murmur Neurologically she is grossly  intact  Carotid duplex today reveals significant calcified plaque bilaterally. She does not have any evidence of critical stenosis with no change from her prior exam from 6 months ago. Maximal velocity suggestion is a 60-79% on the left  Impression and plan: Stable asymptomatic carotid disease. Again reviewed symptoms with the patient and her husband present. Recommended a six-month followup duplex ultrasound. She will notify us immediately should she have symptoms from her carotid disease

## 2013-05-04 NOTE — Addendum Note (Signed)
Addended by: Dorthula Rue L on: 05/04/2013 03:19 PM   Modules accepted: Orders

## 2013-05-28 ENCOUNTER — Ambulatory Visit (HOSPITAL_COMMUNITY): Payer: Medicare Other

## 2013-05-31 ENCOUNTER — Inpatient Hospital Stay (HOSPITAL_COMMUNITY): Admission: RE | Admit: 2013-05-31 | Payer: Medicare Other | Source: Ambulatory Visit

## 2013-06-03 ENCOUNTER — Ambulatory Visit (HOSPITAL_COMMUNITY)
Admission: RE | Admit: 2013-06-03 | Discharge: 2013-06-03 | Disposition: A | Discharge status: Home / Self Care | Payer: Medicare Other | Source: Ambulatory Visit | Attending: Obstetrics and Gynecology | Admitting: Obstetrics and Gynecology

## 2013-06-03 DIAGNOSIS — Z1231 Encounter for screening mammogram for malignant neoplasm of breast: Secondary | ICD-10-CM | POA: Insufficient documentation

## 2013-06-03 DIAGNOSIS — Z139 Encounter for screening, unspecified: Secondary | ICD-10-CM

## 2013-06-28 ENCOUNTER — Ambulatory Visit (HOSPITAL_COMMUNITY)
Admission: RE | Admit: 2013-06-28 | Discharge: 2013-06-28 | Disposition: A | Discharge status: Home / Self Care | Payer: Medicare Other | Source: Ambulatory Visit | Attending: Internal Medicine | Admitting: Internal Medicine

## 2013-06-28 ENCOUNTER — Other Ambulatory Visit (HOSPITAL_COMMUNITY): Payer: Self-pay | Admitting: Internal Medicine

## 2013-06-28 DIAGNOSIS — R059 Cough, unspecified: Secondary | ICD-10-CM

## 2013-06-28 DIAGNOSIS — R05 Cough: Secondary | ICD-10-CM | POA: Insufficient documentation

## 2013-06-28 DIAGNOSIS — R918 Other nonspecific abnormal finding of lung field: Secondary | ICD-10-CM | POA: Insufficient documentation

## 2013-07-01 ENCOUNTER — Other Ambulatory Visit (HOSPITAL_COMMUNITY): Payer: Self-pay | Admitting: Internal Medicine

## 2013-07-01 DIAGNOSIS — R911 Solitary pulmonary nodule: Secondary | ICD-10-CM

## 2013-07-02 ENCOUNTER — Ambulatory Visit (HOSPITAL_COMMUNITY): Payer: Medicare Other

## 2013-07-02 ENCOUNTER — Ambulatory Visit (HOSPITAL_COMMUNITY)
Admission: RE | Admit: 2013-07-02 | Discharge: 2013-07-02 | Disposition: A | Discharge status: Home / Self Care | Payer: Medicare Other | Source: Ambulatory Visit | Attending: Internal Medicine | Admitting: Internal Medicine

## 2013-07-02 DIAGNOSIS — R079 Chest pain, unspecified: Secondary | ICD-10-CM | POA: Insufficient documentation

## 2013-07-02 DIAGNOSIS — I2510 Atherosclerotic heart disease of native coronary artery without angina pectoris: Secondary | ICD-10-CM | POA: Insufficient documentation

## 2013-07-02 DIAGNOSIS — R911 Solitary pulmonary nodule: Secondary | ICD-10-CM

## 2013-09-17 ENCOUNTER — Encounter (HOSPITAL_COMMUNITY): Payer: Self-pay | Admitting: Pharmacy Technician

## 2013-09-17 NOTE — H&P (Signed)
  NTS SOAP Note  Vital Signs:  Vitals as of: 1/44/3154: Systolic 008: Diastolic 74: Heart Rate 77: Temp 98.66F: Height 31ft 3in: Weight 111Lbs 0 Ounces: BMI 19.66  BMI : 19.66 kg/m2  Subjective: This 73 Years 9 Months old Female presents for a screening TCS.  Last had a colonoscopy over ten years.  Is having some mucosy discharge per rectum.  No family h/o colon cancer.  No blood per rectum.   Review of Symptoms:  Constitutional:unremarkable   Head:unremarkable    Eyes:unremarkable   sinus  Cardiovascular:  unremarkable   Respiratory:unremarkable   Gastrointestin    heartburn Genitourinary:unremarkable       usc...back and neck pain dry Hematolgic/Lymphatic:unremarkable       hay fever   Past Medical History:    Reviewed  Past Medical History  Surgical History: breast biopsies, thyroidectomy, nasal surgeries, spine surgery Medical Problems: hypothyroidism, anxiety, HTN, depressant Allergies: nkda Medications: macrobid, atorvastatin, metoprolol, enalapril, synthroid, levothyroxine, vimovo, carafateHCTZ, singulair, xopenix, baby asa   Social History:Reviewed  Social History  Preferred Language: English Race:  White Ethnicity: Not Hispanic / Latino Age: 73 Years 0 Months Marital Status:  M Alcohol: no   Smoking Status: Never smoker reviewed on 09/16/2013 Functional Status reviewed on 09/16/2013 ------------------------------------------------ Bathing: Normal Cooking: Normal Dressing: Normal Driving: Normal Eating: Normal Managing Meds: Normal Oral Care: Normal Shopping: Normal Toileting: Normal Transferring: Normal Walking: Normal Cognitive Status reviewed on 09/16/2013 ------------------------------------------------ Attention: Normal Decision Making: Normal Language: Normal Memory: Normal Motor: Normal Perception: Normal Problem Solving: Normal Visual and Spatial: Normal   Family History:   Reviewed  Family Health History Mother, Deceased; History Unknown Father, Deceased; Healthy;     Objective Information: General:  Well appearing, well nourished in no distress. Skin:     no rash or prominent lesions Heart:  RRR, no murmur or gallop.  Normal S1, S2.  No S3, S4.  Lungs:    CTA bilaterally, no wheezes, rhonchi, rales.  Breathing unlabored. Abdomen:Soft, NT/ND, no HSM, no masses.   deferred to procedure  Assessment:Need for screening TCS  Diagnoses: V76.51 Screening for malignant neoplasm of colon (Encounter for screening for malignant neoplasm of colon)  Procedures: 67619 - OFFICE OUTPATIENT NEW 20 MINUTES    Plan:  Scheduled for TCS on 10/05/13.   Patient Education:Alternative treatments to surgery were discussed with patient (and family).  Risks and benefits  of procedure including bleeding and perforation were fully explained to the patient (and family) who gave informed consent. Patient/family questions were addressed.  Follow-up:Pending Surgery

## 2013-10-05 ENCOUNTER — Ambulatory Visit (HOSPITAL_COMMUNITY)
Admission: RE | Admit: 2013-10-05 | Discharge: 2013-10-05 | Disposition: A | Discharge status: Home / Self Care | Payer: Medicare Other | Source: Ambulatory Visit | Attending: General Surgery | Admitting: General Surgery

## 2013-10-05 ENCOUNTER — Encounter (HOSPITAL_COMMUNITY)
Admission: RE | Disposition: A | Discharge status: Home / Self Care | Payer: Self-pay | Source: Ambulatory Visit | Attending: General Surgery

## 2013-10-05 DIAGNOSIS — Z1211 Encounter for screening for malignant neoplasm of colon: Secondary | ICD-10-CM | POA: Diagnosis present

## 2013-10-05 HISTORY — PX: COLONOSCOPY: SHX5424

## 2013-10-05 SURGERY — COLONOSCOPY
Anesthesia: Moderate Sedation

## 2013-10-05 MED ORDER — MEPERIDINE HCL 50 MG/ML IJ SOLN
INTRAMUSCULAR | Status: DC | PRN
  Administered 2013-10-05 (×2): 25 mg via INTRAVENOUS

## 2013-10-05 MED ORDER — MEPERIDINE HCL 100 MG/ML IJ SOLN
INTRAMUSCULAR | Status: AC
  Filled 2013-10-05: qty 2

## 2013-10-05 MED ORDER — SODIUM CHLORIDE 0.9 % IV SOLN: INTRAVENOUS | Status: DC

## 2013-10-05 MED ORDER — MIDAZOLAM HCL 5 MG/5ML IJ SOLN
INTRAMUSCULAR | Status: AC
  Filled 2013-10-05: qty 10

## 2013-10-05 MED ORDER — MIDAZOLAM HCL 5 MG/5ML IJ SOLN
INTRAMUSCULAR | Status: DC | PRN
  Administered 2013-10-05 (×2): 2 mg via INTRAVENOUS
  Administered 2013-10-05: 1 mg via INTRAVENOUS

## 2013-10-05 NOTE — Op Note (Signed)
[facility] [address] [place], [address]   COLONOSCOPY PROCEDURE REPORT  PATIENT: Belinda Clark, Belinda Clark  MR#: [id number] BIRTHDATE: 16-Oct-1940 , 73  yrs. old GENDER: Female ENDOSCOPIST: Aviva Signs, MD REFERRED NL:[id number], Fritz Pickerel PROCEDURE DATE:  10/05/2013 PROCEDURE:   Colonoscopy, screening ASA CLASS:   Class II INDICATIONS:Average risk patient for colon cancer. MEDICATIONS: Versed 5 mg IV and Demerol 50 mg IV  DESCRIPTION OF PROCEDURE:   After the risks benefits and alternatives of the procedure were thoroughly explained, informed consent was obtained.  A digital rectal exam revealed external hemorrhoids.   The [id number] ([id number])  endoscope was introduced through the anus and advanced to the cecum, which was identified by both the appendix and ileocecal valve. No adverse events experienced.   The quality of the prep was adequate, using Trilyte The instrument was then slowly withdrawn as the colon was fully examined.      COLON FINDINGS: A normal appearing cecum, ileocecal valve, and appendiceal orifice were identified.  The ascending, hepatic flexure, transverse, splenic flexure, descending, sigmoid colon and rectum appeared unremarkable.  No polyps or cancers were seen. Retroflexion was not performed due to a narrow rectal vault. The time to cecum=4 minutes 0 seconds.  Withdrawal time=3 minutes 0 seconds.  The scope was withdrawn and the procedure completed. COMPLICATIONS: There were no complications.  ENDOSCOPIC IMPRESSION: Normal colon  RECOMMENDATIONS: Repeat Colonscopy in 10 years.   eSigned:  Aviva Signs, MD 10/05/2013 8:49 AM   cc:

## 2013-10-05 NOTE — OR Nursing (Signed)
Called Dr Arnoldo Morale, pt BP in 50'V systolic, pt is awake HR 75, bolus pt per MD, Husband in the room, pt drinking coffee

## 2013-10-05 NOTE — Interval H&P Note (Signed)
History and Physical Interval Note:  10/05/2013 8:31 AM  Belinda Clark  has presented today for surgery, with the diagnosis of screening  The various methods of treatment have been discussed with the patient and family. After consideration of risks, benefits and other options for treatment, the patient has consented to  Procedure(s): COLONOSCOPY (N/A) as a surgical intervention .  The patient's history has been reviewed, patient examined, no change in status, stable for surgery.  I have reviewed the patient's chart and labs.  Questions were answered to the patient's satisfaction.     Aviva Signs A

## 2013-10-05 NOTE — Discharge Instructions (Signed)

## 2013-10-11 ENCOUNTER — Encounter (HOSPITAL_COMMUNITY): Payer: Self-pay | Admitting: General Surgery

## 2013-10-29 ENCOUNTER — Encounter: Payer: Self-pay | Admitting: Vascular Surgery

## 2013-11-02 ENCOUNTER — Encounter: Payer: Self-pay | Admitting: Vascular Surgery

## 2013-11-02 ENCOUNTER — Ambulatory Visit (INDEPENDENT_AMBULATORY_CARE_PROVIDER_SITE_OTHER): Payer: Medicare Other | Admitting: Vascular Surgery

## 2013-11-02 ENCOUNTER — Ambulatory Visit (HOSPITAL_COMMUNITY)
Admission: RE | Admit: 2013-11-02 | Discharge: 2013-11-02 | Disposition: A | Discharge status: Home / Self Care | Payer: Medicare Other | Source: Ambulatory Visit | Attending: Vascular Surgery | Admitting: Vascular Surgery

## 2013-11-02 VITALS — BP 132/72 | HR 68 | Resp 68 | Ht 63.5 in | Wt 111.0 lb

## 2013-11-02 DIAGNOSIS — I6529 Occlusion and stenosis of unspecified carotid artery: Secondary | ICD-10-CM

## 2013-11-02 DIAGNOSIS — I658 Occlusion and stenosis of other precerebral arteries: Secondary | ICD-10-CM | POA: Insufficient documentation

## 2013-11-02 NOTE — Progress Notes (Signed)
Patient presents today for discussion of asymptomatic carotid stenosis. She remains in good health. She's had no neurologic deficits. She specifically denies amaurosis fugax, transient ischemic attack or stroke. She has no cardiac disease. She is concerned regarding her potential risk for this with a premature sclerotic disease in her family history is also with carotid stenosis. Discussed this further with Dr.Fusco.  Past Medical History  Diagnosis Date  . HTN (hypertension)   . GERD (gastroesophageal reflux disease)   . Hypothyroidism   . High blood pressure   . Arthritis   . Asthma   . Carotid artery stenosis     40-59 % -Left  . Hyperlipidemia     History  Substance Use Topics  . Smoking status: Former Smoker -- 1.00 packs/day for 30 years    Types: Cigarettes    Quit date: 12/26/1993  . Smokeless tobacco: Never Used  . Alcohol Use: No    Family History  Problem Relation Age of Onset  . Heart disease    . Diabetes    . Diabetes Mother   . Hypertension Mother   . Hyperlipidemia Mother   . Heart attack Father   . Heart disease Father   . Hyperlipidemia Father   . Stroke Brother   . Heart disease Brother   . Heart attack Brother   . Hyperlipidemia Brother   . Stroke Maternal Grandmother     Allergies  Allergen Reactions  . Cefuroxime Axetil   . Clarithromycin   . Cleocin [Clindamycin Hcl]   . Codeine   . Cymbalta [Duloxetine Hcl]   . Doxycycline   . Iohexol      Desc: hives   . Levofloxacin   . Metoclopramide Hcl   . Penicillins   . Sulfonamide Derivatives     Current outpatient prescriptions:amLODipine (NORVASC) 5 MG tablet, Take 5 mg by mouth daily.  , Disp: , Rfl: ;  Ascorbic Acid (VITAMIN C) 1000 MG tablet, Take 1,000 mg by mouth daily.  , Disp: , Rfl: ;  aspirin 81 MG tablet, Take 81 mg by mouth daily. , Disp: , Rfl: ;  Atorvastatin Calcium (LIPITOR PO), Take 40 mg by mouth daily. , Disp: , Rfl: ;  B Complex-C (SUPER B COMPLEX PO), Take 1 tablet by  mouth daily. , Disp: , Rfl:  calcium carbonate (OS-CAL) 600 MG TABS, Take 600 mg in the morning and 1,200 mg at night, Disp: , Rfl: ;  Cholecalciferol 1000 UNITS capsule, Take 1,000 Units by mouth daily., Disp: , Rfl: ;  enalapril (VASOTEC) 20 MG tablet, Take 2 tablets (40 mg total) by mouth daily., Disp: 180 tablet, Rfl: 3;  Garlic 5462 MG CAPS, Take 1,000 mg by mouth daily. , Disp: , Rfl:  levothyroxine (SYNTHROID, LEVOTHROID) 125 MCG tablet, Take 125 mcg by mouth daily before breakfast., Disp: , Rfl: ;  LORazepam (ATIVAN) 0.5 MG tablet, Take 0.25-0.5 mg by mouth as needed for sleep. , Disp: , Rfl: ;  metoprolol succinate (TOPROL-XL) 25 MG 24 hr tablet, Take 25 mg by mouth daily., Disp: , Rfl: ;  montelukast (SINGULAIR) 10 MG tablet, Take 10 mg by mouth at bedtime. , Disp: , Rfl:  Multiple Vitamin (MULTIVITAMIN) capsule, Take 1 capsule by mouth daily. , Disp: , Rfl: ;  Omega-3 Fatty Acids (OMEGA 3 PO), Take 300 mg by mouth daily. , Disp: , Rfl: ;  omeprazole (PRILOSEC) 20 MG capsule, Take 1 tablet by mouth Daily., Disp: , Rfl: ;  PROAIR HFA 108 (90 BASE) MCG/ACT  inhaler, Inhale 2 puffs into the lungs every 4 (four) hours as needed for wheezing or shortness of breath. , Disp: , Rfl:  pyridOXINE (VITAMIN B-6) 100 MG tablet, Take 100 mg by mouth daily., Disp: , Rfl: ;  vitamin E 400 UNIT capsule, Take 400 Units by mouth daily.  , Disp: , Rfl: ;  hydrochlorothiazide 25 MG tablet, Take 25 mg by mouth daily.  , Disp: , Rfl: ;  ibandronate (BONIVA) 150 MG tablet, Take 150 mg by mouth every 30 (thirty) days. , Disp: , Rfl:   BP 132/72  Pulse 68  Resp 68  Ht 5' 3.5" (1.613 m)  Wt 111 lb (50.349 kg)  BMI 19.35 kg/m2  SpO2 99%  Body mass index is 19.35 kg/(m^2).       Physical exam well-developed well-nourished thin white female no acute distress Respirations are equal nonlabored Carotid arteries are without bruits bilaterally 2+ radial pulses bilaterally Neurologically she is intact  Repeat  carotid duplex today revealed no significant change in her study from 6 months ago. She does have 60-70% stenosis of left internal carotid and no significant carotid or right system.  Impression and plan moderate to severe asymptomatic left carotid stenosis. Would recommend continued six-month followup. She is questioning why we cannot simply a liver endarterectomy quit worrying about this. I explained that the slight risk of surgery was a 1-1/2 at risk of stroke is not warranted She has greater than 80% stenosis from a statistical standpoint. She is comfortable with this decision will see [place] in 6 months

## 2014-02-14 ENCOUNTER — Ambulatory Visit (INDEPENDENT_AMBULATORY_CARE_PROVIDER_SITE_OTHER): Payer: Medicare Other | Admitting: Obstetrics and Gynecology

## 2014-02-14 ENCOUNTER — Encounter: Payer: Self-pay | Admitting: Obstetrics and Gynecology

## 2014-02-14 VITALS — BP 140/82 | Ht 63.5 in | Wt 116.0 lb

## 2014-02-14 DIAGNOSIS — N6489 Other specified disorders of breast: Secondary | ICD-10-CM | POA: Insufficient documentation

## 2014-02-14 NOTE — Progress Notes (Signed)
Patient ID: Belinda Clark, female   DOB: [birth year], 73 y.o.   MRN: [id number]   [facility] Visit  Patient name: Belinda Clark MRN [id number]  Date of birth: 1940/08/23  CC & HPI:  Belinda Clark is a 73 y.o. female presenting today complaining of a lump on her left breast she noticed last week.  She has a history of three breast biopsies on her left breast.  Her last mammogram was June 04, 2013 and has never missed a mammogram.  She is also complaining of urinary frequency.  She denies pedal edema as an associated symptom.  Her last GYN exam was 3 years ago.  Pt does note increased urinary frequency x last few weeks.  ROS:  All systems have been reviewed and are negative unless otherwise indicated in the HPI.  Pertinent History Reviewed:   Reviewed: Significant for  Medical         Past Medical History  Diagnosis Date  . HTN (hypertension)   . GERD (gastroesophageal reflux disease)   . Hypothyroidism   . High blood pressure   . Arthritis   . Asthma   . Carotid artery stenosis     40-59 % -Left  . Hyperlipidemia                               Surgical Hx:    Past Surgical History  Procedure Laterality Date  . Breast biopsy      x3  . Rhinoplasty    . Thyroidectomy    . Tonsillectomy    . Spine surgery  06/30/11  . Fracture surgery      Spinal Fx 06/30/11  . Colonoscopy N/A 10/05/2013    Procedure: COLONOSCOPY;  Surgeon: Jamesetta So, MD;  Location: [facility];  Service: Gastroenterology;  Laterality: N/A;   Medications: Reviewed & Updated - see associated section                      Current outpatient prescriptions: amLODipine (NORVASC) 5 MG tablet, Take 5 mg by mouth daily.  , Disp: , Rfl: ;  Ascorbic Acid (VITAMIN C) 1000 MG tablet, Take 1,000 mg by mouth daily.  , Disp: , Rfl: ;  aspirin 81 MG tablet, Take 81 mg by mouth daily. , Disp: , Rfl: ;  Atorvastatin Calcium (LIPITOR PO), Take 40 mg by mouth daily. , Disp: , Rfl: ;  B Complex-C (SUPER B COMPLEX  PO), Take 1 tablet by mouth daily. , Disp: , Rfl:  calcium carbonate (OS-CAL) 600 MG TABS, Take 600 mg in the morning and 1,200 mg at night, Disp: , Rfl: ;  Cholecalciferol 1000 UNITS capsule, Take 1,000 Units by mouth daily., Disp: , Rfl: ;  enalapril (VASOTEC) 20 MG tablet, Take 2 tablets (40 mg total) by mouth daily., Disp: 180 tablet, Rfl: 3;  Garlic 7902 MG CAPS, Take 1,000 mg by mouth daily. , Disp: , Rfl: ;  hydrochlorothiazide 25 MG tablet, Take 25 mg by mouth daily.  , Disp: , Rfl:  levothyroxine (SYNTHROID, LEVOTHROID) 125 MCG tablet, Take 125 mcg by mouth daily before breakfast., Disp: , Rfl: ;  LORazepam (ATIVAN) 0.5 MG tablet, Take 0.25-0.5 mg by mouth as needed for sleep. , Disp: , Rfl: ;  metoprolol succinate (TOPROL-XL) 25 MG 24 hr tablet, Take 25 mg by mouth daily., Disp: , Rfl: ;  montelukast (SINGULAIR) 10 MG tablet,  Take 10 mg by mouth at bedtime. , Disp: , Rfl:  Multiple Vitamin (MULTIVITAMIN) capsule, Take 1 capsule by mouth daily. , Disp: , Rfl: ;  Omega-3 Fatty Acids (OMEGA 3 PO), Take 300 mg by mouth daily. , Disp: , Rfl: ;  omeprazole (PRILOSEC) 20 MG capsule, Take 1 tablet by mouth Daily., Disp: , Rfl: ;  PROAIR HFA 108 (90 BASE) MCG/ACT inhaler, Inhale 2 puffs into the lungs every 4 (four) hours as needed for wheezing or shortness of breath. , Disp: , Rfl:  pyridOXINE (VITAMIN B-6) 100 MG tablet, Take 100 mg by mouth daily., Disp: , Rfl: ;  vitamin E 400 UNIT capsule, Take 400 Units by mouth daily.  , Disp: , Rfl:    Social History: Reviewed -  reports that she quit smoking about 20 years ago. Her smoking use included Cigarettes. She has a 30 pack-year smoking history. She has never used smokeless tobacco.  Objective Findings:  Vitals: Blood pressure 140/82, height 5' 3.5" (1.613 m), weight 116 lb (52.617 kg).  Physical Examination: General appearance - alert, well appearing, and in no distress, oriented to person, place, and time and normal appearing weight Breasts - s/p br  bx x 3 on left, in area of pt concern, no dimpling or retraction. Axilla neg.  Assessment & Plan:   A:  1. Normal breast tissue in area of prior biopsy  P:  1. Continue normal mammo's in march . 2 pelvic in march  This chart was scribed for Jonnie Kind, MD by Donato Schultz, ED Scribe. This patient was seen in Room 1 and the patient's care was started at 2:55 PM.

## 2014-02-14 NOTE — Progress Notes (Signed)
Patient ID: [name], female   DOB: [birth year], 73 y.o.   MRN: [id number] Pt here today for a lump in her left breast. Pt states that she found a lump in her breast last week.

## 2014-04-27 ENCOUNTER — Other Ambulatory Visit: Payer: Self-pay | Admitting: *Deleted

## 2014-04-27 DIAGNOSIS — I6523 Occlusion and stenosis of bilateral carotid arteries: Secondary | ICD-10-CM

## 2014-04-28 ENCOUNTER — Other Ambulatory Visit: Payer: Self-pay | Admitting: Obstetrics and Gynecology

## 2014-05-02 ENCOUNTER — Encounter: Payer: Self-pay | Admitting: Vascular Surgery

## 2014-05-02 DIAGNOSIS — R0602 Shortness of breath: Secondary | ICD-10-CM | POA: Insufficient documentation

## 2014-05-03 ENCOUNTER — Ambulatory Visit (HOSPITAL_COMMUNITY)
Admission: RE | Admit: 2014-05-03 | Discharge: 2014-05-03 | Disposition: A | Discharge status: Home / Self Care | Payer: Medicare Other | Source: Ambulatory Visit | Attending: Vascular Surgery | Admitting: Vascular Surgery

## 2014-05-03 ENCOUNTER — Ambulatory Visit (INDEPENDENT_AMBULATORY_CARE_PROVIDER_SITE_OTHER): Payer: Medicare Other | Admitting: Vascular Surgery

## 2014-05-03 ENCOUNTER — Encounter: Payer: Self-pay | Admitting: Vascular Surgery

## 2014-05-03 VITALS — BP 129/57 | HR 70 | Resp 18 | Ht 63.75 in | Wt 115.1 lb

## 2014-05-03 DIAGNOSIS — I6523 Occlusion and stenosis of bilateral carotid arteries: Secondary | ICD-10-CM | POA: Diagnosis not present

## 2014-05-03 DIAGNOSIS — I6522 Occlusion and stenosis of left carotid artery: Secondary | ICD-10-CM

## 2014-05-03 NOTE — Patient Instructions (Signed)
°Carotid Artery Disease °The carotid arteries are the two main arteries on either side of the neck that supply blood to the brain. Carotid artery disease, also called carotid artery stenosis, is the narrowing or blockage of one or both carotid arteries. Carotid artery disease increases your risk for a stroke or a transient ischemic attack (TIA). A TIA is an episode in which a waxy, fatty substance that accumulates within the artery (plaque) blocks blood flow to the brain. A TIA is considered a "warning stroke."  °CAUSES  °· Buildup of plaque inside the carotid arteries (atherosclerosis) (common). °· A weakened outpouching in an artery (aneurysm). °· Inflammation of the carotid artery (arteritis). °· A fibrous growth within the carotid artery (fibromuscular dysplasia). °· Tissue death within the carotid artery due to radiation treatment (post-radiation necrosis). °· Decreased blood flow due to spasms of the carotid artery (vasospasm). °· Separation of the walls of the carotid artery (carotid dissection). °RISK FACTORS °· High cholesterol (dyslipidemia).   °· High blood pressure (hypertension).   °· Smoking.   °· Obesity.   °· Diabetes.   °· Family history of cardiovascular disease.   °· Inactivity or lack of regular exercise.   °· Being female. Men have an increased risk of developing atherosclerosis earlier in life than women.   °SYMPTOMS  °Carotid artery disease does not cause symptoms. °DIAGNOSIS °Diagnosis of carotid artery disease may include:  °· A physical exam. Your health care provider may hear an abnormal sound (bruit) when listening to the carotid arteries.   °· Specific tests that look at the blood flow in the carotid arteries. These tests include:   °¨ Carotid artery ultrasonography.   °¨ Carotid or cerebral angiography.   °¨ Computerized tomographic angiography (CTA).   °¨ Magnetic resonance angiography (MRA).   °TREATMENT  °Treatment of carotid artery disease can include a combination of treatments.  Treatment options include: °· Surgery. You may have:   °¨ A carotid endarterectomy. This is a surgery to remove the blockages in the carotid arteries.   °¨ A carotid angioplasty with stenting. This is a nonsurgical interventional procedure. A wire mesh (stent) is used to widen the blocked carotid arteries.   °· Medicines to control blood pressure, cholesterol, and reduce blood clotting (antiplatelet therapy).   °· Adjusting your diet.   °· Lifestyle changes such as:   °¨ Quitting smoking.   °¨ Exercising as tolerated or as directed by your health care provider.   °¨ Controlling and maintaining a good blood pressure.   °¨ Keeping cholesterol levels under control.   °HOME CARE INSTRUCTIONS  °· Take medicines only as directed by your health care provider. Make sure you understand all your medicine instructions. Do not stop your medicines without talking to your health care provider.   °· Follow your health care provider's diet instructions. It is important to eat a healthy diet that is low in saturated fats and includes plenty of fresh fruits, vegetables, and lean meats. High-fat, high-sodium foods as well as foods that are fried, overly processed, or have poor nutritional value should be avoided. °· Maintain a healthy weight.   °· Stay physically active. It is recommended that you get at least 30 minutes of activity every day.   °· Do not use any tobacco products including cigarettes, chewing tobacco, or electronic cigarettes. If you need help quitting, ask your health care provider. °· Limit alcohol use to:   °¨ No more than 2 drinks per day for men.   °¨ No more than 1 drink per day for nonpregnant women.   °· Do not use illegal drugs.   °· Keep all follow-up visits as directed by your health   care provider.  SEEK IMMEDIATE MEDICAL CARE IF:  You develop TIA or stroke symptoms. These include:   Sudden weakness or numbness on one side of the body, such as in the face, arm, or leg.   Sudden confusion.    Trouble speaking (aphasia) or understanding.   Sudden trouble seeing out of one or both eyes.   Sudden trouble walking.   Dizziness or feeling like you might faint.   Loss of balance or coordination.   Sudden severe headache with no known cause.   Sudden trouble swallowing (dysphagia).  If you have any of these symptoms, call your local emergency services (911 in [place] Do not drive yourself to the clinic or hospital. This is a medical emergency.  Document Released: 05/06/2011 Document Revised: 06/28/2013 Document Reviewed: 08/12/2012 [facility] Patient Information 2015 [place], [place]. This information is not intended to replace advice given to you by your health care provider. Make sure you discuss any questions you have with your health care provider.

## 2014-05-03 NOTE — Progress Notes (Signed)
Patient resents today with her husband for continued discussion of moderate to severe left internal carotid artery stenosis. She has no symptoms referable to this. Specifically no amaurosis fugax, transient ischemic attack or stroke. She did report a high calcium score screening study and is therefore undergoing cardiac stress test to rule out any asymptomatic disease  Past Medical History  Diagnosis Date  . HTN (hypertension)   . GERD (gastroesophageal reflux disease)   . Hypothyroidism   . High blood pressure   . Arthritis   . Asthma   . Carotid artery stenosis     40-59 % -Left  . Hyperlipidemia   . Glaucoma     History  Substance Use Topics  . Smoking status: Former Smoker -- 1.00 packs/day for 30 years    Types: Cigarettes    Quit date: 12/26/1993  . Smokeless tobacco: Never Used  . Alcohol Use: No    Family History  Problem Relation Age of Onset  . Heart disease    . Diabetes    . Diabetes Mother   . Hypertension Mother   . Hyperlipidemia Mother   . Heart attack Father   . Heart disease Father   . Hyperlipidemia Father   . Stroke Brother   . Heart disease Brother   . Heart attack Brother   . Hyperlipidemia Brother   . Stroke Maternal Grandmother     Allergies  Allergen Reactions  . Cefuroxime Axetil   . Clarithromycin   . Cleocin [Clindamycin Hcl]   . Codeine   . Cymbalta [Duloxetine Hcl]   . Doxycycline   . Iohexol      Desc: hives   . Levofloxacin   . Metoclopramide Hcl   . Penicillins   . Sulfonamide Derivatives      Current outpatient prescriptions:  .  alendronate (FOSAMAX) 70 MG tablet, Take 70 mg by mouth once a week. Take with a full glass of water on an empty stomach., Disp: , Rfl:  .  amLODipine (NORVASC) 5 MG tablet, Take 5 mg by mouth daily.  , Disp: , Rfl:  .  Ascorbic Acid (VITAMIN C) 1000 MG tablet, Take 1,000 mg by mouth daily.  , Disp: , Rfl:  .  aspirin 81 MG tablet, Take 81 mg by mouth daily. , Disp: , Rfl:  .  Atorvastatin  Calcium (LIPITOR PO), Take 40 mg by mouth daily. , Disp: , Rfl:  .  B Complex-C (SUPER B COMPLEX PO), Take 1 tablet by mouth daily. , Disp: , Rfl:  .  calcium carbonate (OS-CAL) 600 MG TABS, Take 600 mg in the morning and 1,200 mg at night, Disp: , Rfl:  .  Cholecalciferol 1000 UNITS capsule, Take 1,000 Units by mouth daily., Disp: , Rfl:  .  enalapril (VASOTEC) 20 MG tablet, Take 2 tablets (40 mg total) by mouth daily., Disp: 180 tablet, Rfl: 3 .  Garlic 0258 MG CAPS, Take 1,000 mg by mouth daily. , Disp: , Rfl:  .  hydrochlorothiazide 25 MG tablet, Take 25 mg by mouth daily.  , Disp: , Rfl:  .  levothyroxine (SYNTHROID, LEVOTHROID) 125 MCG tablet, Take 125 mcg by mouth daily before breakfast., Disp: , Rfl:  .  LORazepam (ATIVAN) 0.5 MG tablet, Take 0.25-0.5 mg by mouth as needed for sleep. , Disp: , Rfl:  .  metoprolol succinate (TOPROL-XL) 25 MG 24 hr tablet, Take 25 mg by mouth daily., Disp: , Rfl:  .  montelukast (SINGULAIR) 10 MG tablet, Take 10 mg  by mouth at bedtime. , Disp: , Rfl:  .  Multiple Vitamin (MULTIVITAMIN) capsule, Take 1 capsule by mouth daily. , Disp: , Rfl:  .  Omega-3 Fatty Acids (OMEGA 3 PO), Take 300 mg by mouth daily. , Disp: , Rfl:  .  omeprazole (PRILOSEC) 20 MG capsule, Take 1 tablet by mouth Daily., Disp: , Rfl:  .  PROAIR HFA 108 (90 BASE) MCG/ACT inhaler, Inhale 2 puffs into the lungs every 4 (four) hours as needed for wheezing or shortness of breath. , Disp: , Rfl:  .  pyridOXINE (VITAMIN B-6) 100 MG tablet, Take 100 mg by mouth daily., Disp: , Rfl:  .  vitamin E 400 UNIT capsule, Take 400 Units by mouth daily.  , Disp: , Rfl:   BP 129/57 mmHg  Pulse 70  Resp 18  Ht 5' 3.75" (1.619 m)  Wt 115 lb 1.6 oz (52.209 kg)  BMI 19.92 kg/m2  Body mass index is 19.92 kg/(m^2).       Physical exam will well-nourished female no acute distress respirations are equal nonlabored Carotid arteries without bruits bilaterally 2+ radial pulses bilaterally Neurologically  she is grossly intact  Carotid duplex was reviewed with patient. This shows no significant change. She does have 40-60% left internal carotid artery stenosis. She has less than 40% right internal carotid artery stenosis.  Impression and plan asymptomatic moderate to severe left carotid stenosis. I again reviewed symptoms and her husband regarding carotid disease. She will notify us immediately should this occur. Otherwise we'll see her in 6 months with continued follow-up of her moderate to severe asymptomatic left carotid stenosis

## 2014-05-05 NOTE — Addendum Note (Signed)
Addended by: Mena Goes on: 05/05/2014 03:11 PM   Modules accepted: Orders

## 2014-05-05 NOTE — Addendum Note (Signed)
Addended by: Mena Goes on: 05/05/2014 03:13 PM   Modules accepted: Orders

## 2014-05-16 ENCOUNTER — Ambulatory Visit: Payer: Medicare Other | Admitting: Obstetrics and Gynecology

## 2014-06-22 ENCOUNTER — Ambulatory Visit: Payer: Self-pay | Admitting: Obstetrics and Gynecology

## 2014-07-04 ENCOUNTER — Other Ambulatory Visit: Payer: Self-pay | Admitting: Obstetrics and Gynecology

## 2014-07-04 DIAGNOSIS — Z1231 Encounter for screening mammogram for malignant neoplasm of breast: Secondary | ICD-10-CM

## 2014-07-19 ENCOUNTER — Ambulatory Visit (HOSPITAL_COMMUNITY): Payer: Medicare Other

## 2014-07-20 ENCOUNTER — Ambulatory Visit (HOSPITAL_COMMUNITY)
Admission: RE | Admit: 2014-07-20 | Discharge: 2014-07-20 | Disposition: A | Discharge status: Home / Self Care | Payer: Medicare Other | Source: Ambulatory Visit | Attending: Obstetrics and Gynecology | Admitting: Obstetrics and Gynecology

## 2014-07-20 DIAGNOSIS — Z1231 Encounter for screening mammogram for malignant neoplasm of breast: Secondary | ICD-10-CM | POA: Diagnosis not present

## 2014-07-21 ENCOUNTER — Ambulatory Visit (HOSPITAL_COMMUNITY): Payer: Medicare Other

## 2014-08-16 ENCOUNTER — Other Ambulatory Visit (HOSPITAL_COMMUNITY): Payer: Self-pay | Admitting: Internal Medicine

## 2014-08-16 DIAGNOSIS — R519 Headache, unspecified: Secondary | ICD-10-CM

## 2014-08-16 DIAGNOSIS — R51 Headache: Principal | ICD-10-CM

## 2014-08-18 ENCOUNTER — Inpatient Hospital Stay (HOSPITAL_COMMUNITY): Admission: RE | Admit: 2014-08-18 | Payer: Medicare Other | Source: Ambulatory Visit

## 2014-08-18 ENCOUNTER — Ambulatory Visit (HOSPITAL_COMMUNITY): Payer: Medicare Other

## 2014-08-24 ENCOUNTER — Ambulatory Visit: Payer: Self-pay | Admitting: Obstetrics and Gynecology

## 2014-09-05 ENCOUNTER — Other Ambulatory Visit (HOSPITAL_COMMUNITY): Payer: Self-pay | Admitting: Internal Medicine

## 2014-09-05 DIAGNOSIS — R519 Headache, unspecified: Secondary | ICD-10-CM

## 2014-09-05 DIAGNOSIS — R51 Headache: Principal | ICD-10-CM

## 2014-09-05 DIAGNOSIS — J329 Chronic sinusitis, unspecified: Secondary | ICD-10-CM

## 2014-09-06 ENCOUNTER — Ambulatory Visit (HOSPITAL_COMMUNITY)
Admission: RE | Admit: 2014-09-06 | Discharge: 2014-09-06 | Disposition: A | Discharge status: Home / Self Care | Payer: Medicare Other | Source: Ambulatory Visit | Attending: Internal Medicine | Admitting: Internal Medicine

## 2014-09-06 DIAGNOSIS — R519 Headache, unspecified: Secondary | ICD-10-CM

## 2014-09-06 DIAGNOSIS — R51 Headache: Secondary | ICD-10-CM | POA: Insufficient documentation

## 2014-09-06 DIAGNOSIS — J329 Chronic sinusitis, unspecified: Secondary | ICD-10-CM

## 2014-11-21 ENCOUNTER — Encounter: Payer: Self-pay | Admitting: Vascular Surgery

## 2014-11-22 ENCOUNTER — Ambulatory Visit: Payer: Medicare Other | Admitting: Vascular Surgery

## 2014-11-22 ENCOUNTER — Encounter (HOSPITAL_COMMUNITY): Payer: Medicare Other

## 2015-01-17 ENCOUNTER — Encounter: Payer: Self-pay | Admitting: Vascular Surgery

## 2015-01-24 ENCOUNTER — Ambulatory Visit (INDEPENDENT_AMBULATORY_CARE_PROVIDER_SITE_OTHER): Payer: Medicare Other | Admitting: Vascular Surgery

## 2015-01-24 ENCOUNTER — Encounter: Payer: Self-pay | Admitting: Vascular Surgery

## 2015-01-24 ENCOUNTER — Ambulatory Visit (HOSPITAL_COMMUNITY)
Admission: RE | Admit: 2015-01-24 | Discharge: 2015-01-24 | Disposition: A | Discharge status: Home / Self Care | Payer: Medicare Other | Source: Ambulatory Visit | Attending: Vascular Surgery | Admitting: Vascular Surgery

## 2015-01-24 VITALS — BP 124/78 | HR 73 | Temp 98.5°F | Resp 16 | Ht 64.0 in | Wt 112.3 lb

## 2015-01-24 DIAGNOSIS — I6522 Occlusion and stenosis of left carotid artery: Secondary | ICD-10-CM | POA: Diagnosis not present

## 2015-01-24 DIAGNOSIS — E785 Hyperlipidemia, unspecified: Secondary | ICD-10-CM | POA: Diagnosis not present

## 2015-01-24 DIAGNOSIS — I6523 Occlusion and stenosis of bilateral carotid arteries: Secondary | ICD-10-CM | POA: Insufficient documentation

## 2015-01-24 DIAGNOSIS — I10 Essential (primary) hypertension: Secondary | ICD-10-CM | POA: Insufficient documentation

## 2015-01-24 NOTE — Progress Notes (Signed)
History of Present Illness:  Patient is a 74 y.o. year old female who presents for evaluation of carotid stenosis via repeat carotid doppler study.  The patient denies symptoms of TIA, amaurosis, or stroke.  Past medical history includes; hypertension managed on beta blocker, hyperlipidemia managed with a statin, and she takes an 81 mg aspirin daily.     She was tearful in the office today.  She did tell us she is worried about her husband he has been newly diagnosed with Addison's disease.   Past Medical History  Diagnosis Date  . HTN (hypertension)   . GERD (gastroesophageal reflux disease)   . Hypothyroidism   . High blood pressure   . Arthritis   . Asthma   . Carotid artery stenosis     40-59 % -Left  . Hyperlipidemia   . Glaucoma     Past Surgical History  Procedure Laterality Date  . Breast biopsy      x3  . Rhinoplasty    . Thyroidectomy    . Tonsillectomy    . Spine surgery  06/30/11  . Fracture surgery      Spinal Fx 06/30/11  . Colonoscopy N/A 10/05/2013    Procedure: COLONOSCOPY;  Surgeon: Jamesetta So, MD;  Location: [facility];  Service: Gastroenterology;  Laterality: N/A;     Social History Social History  Substance Use Topics  . Smoking status: Former Smoker -- 1.00 packs/day for 30 years    Types: Cigarettes    Quit date: 12/26/1993  . Smokeless tobacco: Never Used  . Alcohol Use: No    Family History Family History  Problem Relation Age of Onset  . Heart disease    . Diabetes    . Diabetes Mother   . Hypertension Mother   . Hyperlipidemia Mother   . Heart attack Father   . Heart disease Father   . Hyperlipidemia Father   . Stroke Brother   . Heart disease Brother   . Heart attack Brother   . Hyperlipidemia Brother   . Stroke Maternal Grandmother     Allergies  Allergies  Allergen Reactions  . Cefuroxime Axetil   . Clarithromycin   . Cleocin [Clindamycin Hcl]   . Codeine   . Cymbalta [Duloxetine Hcl]   . Doxycycline   .  Iohexol      Desc: hives   . Levofloxacin   . Metoclopramide Hcl   . Penicillins   . Sulfonamide Derivatives      Current Outpatient Prescriptions  Medication Sig Dispense Refill  . alendronate (FOSAMAX) 70 MG tablet Take 70 mg by mouth once a week. Take with a full glass of water on an empty stomach.    Marland Kitchen amLODipine (NORVASC) 5 MG tablet Take 5 mg by mouth daily.      . Ascorbic Acid (VITAMIN C) 1000 MG tablet Take 1,000 mg by mouth daily.      Marland Kitchen aspirin 81 MG tablet Take 81 mg by mouth daily.     . Atorvastatin Calcium (LIPITOR PO) Take 40 mg by mouth daily.     . B Complex-C (SUPER B COMPLEX PO) Take 1 tablet by mouth daily.     . calcium carbonate (OS-CAL) 600 MG TABS Take 600 mg in the morning and 1,200 mg at night    . Cholecalciferol 1000 UNITS capsule Take 1,000 Units by mouth daily.    . enalapril (VASOTEC) 20 MG tablet Take 2 tablets (40 mg total) by  mouth daily. 180 tablet 3  . Garlic 123XX123 MG CAPS Take 1,000 mg by mouth daily.     . hydrochlorothiazide 25 MG tablet Take 25 mg by mouth daily.      Marland Kitchen levothyroxine (SYNTHROID, LEVOTHROID) 125 MCG tablet Take 125 mcg by mouth daily before breakfast.    . LORazepam (ATIVAN) 0.5 MG tablet Take 0.25-0.5 mg by mouth as needed for sleep.     . metoprolol succinate (TOPROL-XL) 25 MG 24 hr tablet Take 25 mg by mouth daily.    . montelukast (SINGULAIR) 10 MG tablet Take 10 mg by mouth at bedtime.     . Multiple Vitamin (MULTIVITAMIN) capsule Take 1 capsule by mouth daily.     . Omega-3 Fatty Acids (OMEGA 3 PO) Take 300 mg by mouth daily.     Marland Kitchen omeprazole (PRILOSEC) 20 MG capsule Take 1 tablet by mouth Daily.    Marland Kitchen PROAIR HFA 108 (90 BASE) MCG/ACT inhaler Inhale 2 puffs into the lungs every 4 (four) hours as needed for wheezing or shortness of breath.     . pyridOXINE (VITAMIN B-6) 100 MG tablet Take 100 mg by mouth daily.    . vitamin E 400 UNIT capsule Take 400 Units by mouth daily.       No current facility-administered medications  for this visit.    ROS:   General:  No weight loss, Fever, chills  HEENT: No recent headaches, no nasal bleeding, no visual changes, no sore throat  Neurologic: No dizziness, blackouts, seizures. No recent symptoms of stroke or mini- stroke. No recent episodes of slurred speech, or temporary blindness.  Cardiac: No recent episodes of chest pain/pressure, no shortness of breath at rest.  No shortness of breath with exertion.  Denies history of atrial fibrillation or irregular heartbeat  Vascular: No history of rest pain in feet.  No history of claudication.  No history of non-healing ulcer, No history of DVT   Pulmonary: No home oxygen, no productive cough, no hemoptysis,  No asthma or wheezing  Musculoskeletal:  [ ]  Arthritis, [ ]  Low back pain,  [ ]  Joint pain  Hematologic:No history of hypercoagulable state.  No history of easy bleeding.  No history of anemia  Gastrointestinal: No hematochezia or melena,  No gastroesophageal reflux, no trouble swallowing  Urinary: [ ]  chronic Kidney disease, [ ]  on HD - [ ]  MWF or [ ]  TTHS, [ ]  Burning with urination, [ ]  Frequent urination, [ ]  Difficulty urinating;   Skin: No rashes  Psychological: No history of anxiety,  No history of depression   Physical Examination  Filed Vitals:   01/24/15 1544  BP: 124/78  Pulse: 73  Temp: 98.5 F (36.9 C)  TempSrc: Oral  Resp: 16  Height: 5\' 4"  (1.626 m)  Weight: 112 lb 4.8 oz (50.939 kg)  SpO2: 97%    Body mass index is 19.27 kg/(m^2).  General:  Alert and oriented, no acute distress HEENT: Normal Neck: positive  bruit bilaterally Pulmonary: Clear to auscultation bilaterally Cardiac: Regular Rate and Rhythm without murmur Gastrointestinal: Soft, non-tender, non-distended, no mass, no scars Skin: No rash Extremity Pulses:  2+ radial, brachial, femoral, dorsalis pedis, posterior tibial pulses bilaterally Musculoskeletal: No deformity or edema  Neurologic: Upper and lower extremity  motor 5/5 and symmetric  DATA:  Right  ICA < 40% Left ICA [id number] % systolic velocity [id number] and end diastolic 72   ASSESSMENT:  Bilateral carotid stenosis L greater than R   PLAN: There is  no significant changes noted on her carotid duplex study today.  Since she does have moderate to sever asymptomatic stenosis on the left ICA we recommend she return in 6 months for a repeat carotid duplex.     Theda Sers, EMMA [facility] PA-C [facility] Office: [phone]  I have examined the patient, reviewed and agree with above.  Curt Jews, MD 01/24/2015 5:12 PM

## 2015-04-06 DIAGNOSIS — J019 Acute sinusitis, unspecified: Secondary | ICD-10-CM | POA: Diagnosis not present

## 2015-04-06 DIAGNOSIS — I10 Essential (primary) hypertension: Secondary | ICD-10-CM | POA: Diagnosis not present

## 2015-04-06 DIAGNOSIS — R079 Chest pain, unspecified: Secondary | ICD-10-CM | POA: Diagnosis not present

## 2015-04-14 DIAGNOSIS — J329 Chronic sinusitis, unspecified: Secondary | ICD-10-CM | POA: Diagnosis not present

## 2015-04-14 DIAGNOSIS — Z1389 Encounter for screening for other disorder: Secondary | ICD-10-CM | POA: Diagnosis not present

## 2015-04-19 DIAGNOSIS — K219 Gastro-esophageal reflux disease without esophagitis: Secondary | ICD-10-CM | POA: Diagnosis not present

## 2015-04-19 DIAGNOSIS — Z1389 Encounter for screening for other disorder: Secondary | ICD-10-CM | POA: Diagnosis not present

## 2015-04-19 DIAGNOSIS — J019 Acute sinusitis, unspecified: Secondary | ICD-10-CM | POA: Diagnosis not present

## 2015-05-10 DIAGNOSIS — M810 Age-related osteoporosis without current pathological fracture: Secondary | ICD-10-CM | POA: Diagnosis not present

## 2015-05-10 DIAGNOSIS — Z0000 Encounter for general adult medical examination without abnormal findings: Secondary | ICD-10-CM | POA: Diagnosis not present

## 2015-05-10 DIAGNOSIS — Z1389 Encounter for screening for other disorder: Secondary | ICD-10-CM | POA: Diagnosis not present

## 2015-05-11 DIAGNOSIS — Z0000 Encounter for general adult medical examination without abnormal findings: Secondary | ICD-10-CM | POA: Diagnosis not present

## 2015-05-11 DIAGNOSIS — Z1389 Encounter for screening for other disorder: Secondary | ICD-10-CM | POA: Diagnosis not present

## 2015-05-11 DIAGNOSIS — Z681 Body mass index (BMI) 19 or less, adult: Secondary | ICD-10-CM | POA: Diagnosis not present

## 2015-05-15 DIAGNOSIS — I10 Essential (primary) hypertension: Secondary | ICD-10-CM | POA: Diagnosis not present

## 2015-05-15 DIAGNOSIS — E063 Autoimmune thyroiditis: Secondary | ICD-10-CM | POA: Diagnosis not present

## 2015-05-31 ENCOUNTER — Other Ambulatory Visit (HOSPITAL_COMMUNITY): Payer: Self-pay | Admitting: Cardiology

## 2015-05-31 DIAGNOSIS — Z1231 Encounter for screening mammogram for malignant neoplasm of breast: Secondary | ICD-10-CM

## 2015-07-10 DIAGNOSIS — Z1389 Encounter for screening for other disorder: Secondary | ICD-10-CM | POA: Diagnosis not present

## 2015-07-10 DIAGNOSIS — I10 Essential (primary) hypertension: Secondary | ICD-10-CM | POA: Diagnosis not present

## 2015-07-10 DIAGNOSIS — K219 Gastro-esophageal reflux disease without esophagitis: Secondary | ICD-10-CM | POA: Diagnosis not present

## 2015-07-10 DIAGNOSIS — E063 Autoimmune thyroiditis: Secondary | ICD-10-CM | POA: Diagnosis not present

## 2015-07-11 ENCOUNTER — Encounter: Payer: Self-pay | Admitting: Vascular Surgery

## 2015-07-17 ENCOUNTER — Other Ambulatory Visit: Payer: Self-pay | Admitting: *Deleted

## 2015-07-17 DIAGNOSIS — I6523 Occlusion and stenosis of bilateral carotid arteries: Secondary | ICD-10-CM

## 2015-07-18 ENCOUNTER — Ambulatory Visit (HOSPITAL_COMMUNITY)
Admission: RE | Admit: 2015-07-18 | Discharge: 2015-07-18 | Disposition: A | Discharge status: Home / Self Care | Payer: Medicare Other | Source: Ambulatory Visit | Attending: Vascular Surgery | Admitting: Vascular Surgery

## 2015-07-18 ENCOUNTER — Encounter: Payer: Self-pay | Admitting: Vascular Surgery

## 2015-07-18 ENCOUNTER — Ambulatory Visit (INDEPENDENT_AMBULATORY_CARE_PROVIDER_SITE_OTHER): Payer: Medicare Other | Admitting: Vascular Surgery

## 2015-07-18 VITALS — BP 114/64 | HR 75 | Temp 98.3°F | Resp 18 | Ht 63.5 in | Wt 110.4 lb

## 2015-07-18 DIAGNOSIS — E785 Hyperlipidemia, unspecified: Secondary | ICD-10-CM | POA: Diagnosis not present

## 2015-07-18 DIAGNOSIS — I6522 Occlusion and stenosis of left carotid artery: Secondary | ICD-10-CM | POA: Diagnosis not present

## 2015-07-18 DIAGNOSIS — K219 Gastro-esophageal reflux disease without esophagitis: Secondary | ICD-10-CM | POA: Diagnosis not present

## 2015-07-18 DIAGNOSIS — I10 Essential (primary) hypertension: Secondary | ICD-10-CM | POA: Diagnosis not present

## 2015-07-18 DIAGNOSIS — I6523 Occlusion and stenosis of bilateral carotid arteries: Secondary | ICD-10-CM

## 2015-07-18 NOTE — Progress Notes (Signed)
[facility] [place]  Patient name: Belinda Clark MRN: [id number] DOB: 05/15/1940 Sex: female  REASON FOR VISIT: Follow-up carotid stenosis  HPI: Belinda Clark is a 75 y.o. female who presents for continued follow-up of her left carotid stenosis. She was last seen in the office 6 months ago. Her duplex that time revealed 60-70% left internal carotid artery stenosis. She has no prior history of TIA or stroke. She denies amaurosis fugax, hemiplegia, receptive or expressive aphasia. She takes daily aspirin. She is on a statin for hyperlipidemia. He denies any changes in her medical history since her last office visit.  Past Medical History  Diagnosis Date  . HTN (hypertension)   . GERD (gastroesophageal reflux disease)   . Hypothyroidism   . High blood pressure   . Arthritis   . Asthma   . Carotid artery stenosis     40-59 % -Left  . Hyperlipidemia   . Glaucoma     Family History  Problem Relation Age of Onset  . Heart disease    . Diabetes    . Diabetes Mother   . Hypertension Mother   . Hyperlipidemia Mother   . Heart attack Father   . Heart disease Father   . Hyperlipidemia Father   . Stroke Brother   . Heart disease Brother   . Heart attack Brother   . Hyperlipidemia Brother   . Stroke Maternal Grandmother     SOCIAL HISTORY: Social History  Substance Use Topics  . Smoking status: Former Smoker -- 1.00 packs/day for 30 years    Types: Cigarettes    Quit date: 12/26/1993  . Smokeless tobacco: Never Used  . Alcohol Use: No    Allergies  Allergen Reactions  . Cefuroxime Axetil   . Clarithromycin   . Cleocin [Clindamycin Hcl]   . Codeine   . Cymbalta [Duloxetine Hcl]   . Doxycycline   . Iohexol      Desc: hives   . Levofloxacin   . Metoclopramide Hcl   . Penicillins   . Sulfonamide Derivatives     Current Outpatient Prescriptions  Medication Sig Dispense Refill  . alendronate (FOSAMAX) 70 MG tablet Take 70 mg by mouth once  a week. Take with a full glass of water on an empty stomach.    Marland Kitchen amLODipine (NORVASC) 5 MG tablet Take 5 mg by mouth daily.      . Ascorbic Acid (VITAMIN C) 1000 MG tablet Take 1,000 mg by mouth daily.      Marland Kitchen aspirin 81 MG tablet Take 81 mg by mouth daily.     . Atorvastatin Calcium (LIPITOR PO) Take 40 mg by mouth daily.     . B Complex-C (SUPER B COMPLEX PO) Take 1 tablet by mouth daily.     . calcium carbonate (OS-CAL) 600 MG TABS Take 600 mg in the morning and 1,200 mg at night    . Cholecalciferol 1000 UNITS capsule Take 1,000 Units by mouth daily.    . diphenhydrAMINE (SOMINEX) 25 MG tablet Take 25 mg by mouth as needed for sleep.    . enalapril (VASOTEC) 20 MG tablet Take 2 tablets (40 mg total) by mouth daily. 180 tablet 3  . Garlic 123XX123 MG CAPS Take 1,000 mg by mouth daily.     Marland Kitchen levothyroxine (SYNTHROID, LEVOTHROID) 125 MCG tablet Take 100 mcg by mouth daily before breakfast.     . LORazepam (ATIVAN) 0.5 MG tablet Take 0.5 mg by mouth as  needed for sleep.     . metoprolol succinate (TOPROL-XL) 25 MG 24 hr tablet Take 25 mg by mouth daily.    . montelukast (SINGULAIR) 10 MG tablet Take 10 mg by mouth daily.     . Multiple Vitamin (MULTIVITAMIN) capsule Take 1 capsule by mouth daily.     . Omega-3 Fatty Acids (OMEGA 3 PO) Take 300 mg by mouth daily.     Marland Kitchen omeprazole (PRILOSEC) 20 MG capsule Take 1 tablet by mouth Daily.    Marland Kitchen PROAIR HFA 108 (90 BASE) MCG/ACT inhaler Inhale 2 puffs into the lungs every 4 (four) hours as needed for wheezing or shortness of breath.     . pyridOXINE (VITAMIN B-6) 100 MG tablet Take 100 mg by mouth daily.    . vitamin E 400 UNIT capsule Take 400 Units by mouth daily.      . hydrochlorothiazide 25 MG tablet Take 25 mg by mouth daily. Reported on 07/18/2015     No current facility-administered medications for this visit.    REVIEW OF SYSTEMS:  [X]  denotes positive finding, [ ]  denotes negative finding Cardiac  Comments:  Chest pain or chest pressure:      Shortness of breath upon exertion:    Short of breath when lying flat:    Irregular heart rhythm:        Vascular    Pain in calf, thigh, or hip brought on by ambulation:    Pain in feet at night that wakes you up from your sleep:     Blood clot in your veins:    Leg swelling:         Pulmonary    Oxygen at home:    Productive cough:     Wheezing:         Neurologic    Sudden weakness in arms or legs:     Sudden numbness in arms or legs:     Sudden onset of difficulty speaking or slurred speech:    Temporary loss of vision in one eye:     Problems with dizziness:         Gastrointestinal    Blood in stool:     Vomited blood:         Genitourinary    Burning when urinating:     Blood in urine:        Psychiatric    Major depression:         Hematologic    Bleeding problems:    Problems with blood clotting too easily:        Skin    Rashes or ulcers:        Constitutional    Fever or chills:      PHYSICAL EXAM: Filed Vitals:   07/18/15 1245  BP: 114/64  Pulse: 75  Temp: 98.3 F (36.8 C)  TempSrc: Oral  Resp: 18  Height: 5' 3.5" (1.613 m)  Weight: 110 lb 6.4 oz (50.077 kg)  SpO2: 97%    GENERAL: The patient is a well-nourished female, in no acute distress. The vital signs are documented above. CARDIAC: There is a regular rate and rhythm.  VASCULAR: Bilateral carotid bruits. 2+ radial pulses bilaterally. PULMONARY: There is good air exchange bilaterally without wheezing or rales. MUSCULOSKELETAL: There are no major deformities or cyanosis. NEUROLOGIC: 5 out of 5 strength upper and lower extremities bilaterally. SKIN: There are no ulcers or rashes noted. PSYCHIATRIC: The patient has a normal affect.  DATA:  Carotid duplex  07/18/2015  Left internal carotid artery stenosis 60-79% Less then 40% right internal carotid artery stenosis Vertebral arteries with antegrade flow bilaterally  MEDICAL ISSUES: Asymptomatic left carotid stenosis  Her carotid  duplex results are stable from her study 6 months ago.The patient has not had any signs or symptoms of TIA or stroke. She is on maximal medical management with aspirin and statin. She will follow up in 6 months with a repeat carotid duplex.  Virgina Jock, PA-C [facility] [place]      I have examined the patient, reviewed and agree with above.  Curt Jews, MD 07/18/2015 1:58 PM

## 2015-07-25 ENCOUNTER — Encounter (HOSPITAL_COMMUNITY): Payer: Medicare Other

## 2015-07-25 ENCOUNTER — Ambulatory Visit: Payer: Medicare Other | Admitting: Vascular Surgery

## 2015-07-26 ENCOUNTER — Ambulatory Visit (HOSPITAL_COMMUNITY)
Admission: RE | Admit: 2015-07-26 | Discharge: 2015-07-26 | Disposition: A | Discharge status: Home / Self Care | Payer: Medicare Other | Source: Ambulatory Visit | Attending: Cardiology | Admitting: Cardiology

## 2015-07-26 DIAGNOSIS — Z1231 Encounter for screening mammogram for malignant neoplasm of breast: Secondary | ICD-10-CM | POA: Diagnosis not present

## 2015-08-07 DIAGNOSIS — E039 Hypothyroidism, unspecified: Secondary | ICD-10-CM | POA: Diagnosis not present

## 2015-08-08 DIAGNOSIS — H401133 Primary open-angle glaucoma, bilateral, severe stage: Secondary | ICD-10-CM | POA: Diagnosis not present

## 2015-08-08 DIAGNOSIS — H538 Other visual disturbances: Secondary | ICD-10-CM | POA: Diagnosis not present

## 2015-08-08 DIAGNOSIS — H2513 Age-related nuclear cataract, bilateral: Secondary | ICD-10-CM | POA: Diagnosis not present

## 2015-08-09 DIAGNOSIS — E782 Mixed hyperlipidemia: Secondary | ICD-10-CM | POA: Diagnosis not present

## 2015-08-09 DIAGNOSIS — K219 Gastro-esophageal reflux disease without esophagitis: Secondary | ICD-10-CM | POA: Diagnosis not present

## 2015-08-09 DIAGNOSIS — Z1389 Encounter for screening for other disorder: Secondary | ICD-10-CM | POA: Diagnosis not present

## 2015-08-09 DIAGNOSIS — E063 Autoimmune thyroiditis: Secondary | ICD-10-CM | POA: Diagnosis not present

## 2015-08-09 DIAGNOSIS — I10 Essential (primary) hypertension: Secondary | ICD-10-CM | POA: Diagnosis not present

## 2015-09-26 ENCOUNTER — Other Ambulatory Visit: Payer: Self-pay | Admitting: Vascular Surgery

## 2015-09-26 DIAGNOSIS — I6522 Occlusion and stenosis of left carotid artery: Secondary | ICD-10-CM

## 2015-09-28 DIAGNOSIS — I25110 Atherosclerotic heart disease of native coronary artery with unstable angina pectoris: Secondary | ICD-10-CM | POA: Diagnosis not present

## 2015-10-11 DIAGNOSIS — C44319 Basal cell carcinoma of skin of other parts of face: Secondary | ICD-10-CM | POA: Diagnosis not present

## 2015-10-11 DIAGNOSIS — X32XXXD Exposure to sunlight, subsequent encounter: Secondary | ICD-10-CM | POA: Diagnosis not present

## 2015-10-11 DIAGNOSIS — L570 Actinic keratosis: Secondary | ICD-10-CM | POA: Diagnosis not present

## 2015-11-17 DIAGNOSIS — E782 Mixed hyperlipidemia: Secondary | ICD-10-CM | POA: Diagnosis not present

## 2015-11-17 DIAGNOSIS — I10 Essential (primary) hypertension: Secondary | ICD-10-CM | POA: Diagnosis not present

## 2015-11-17 DIAGNOSIS — J0100 Acute maxillary sinusitis, unspecified: Secondary | ICD-10-CM | POA: Diagnosis not present

## 2015-11-17 DIAGNOSIS — E063 Autoimmune thyroiditis: Secondary | ICD-10-CM | POA: Diagnosis not present

## 2015-11-23 DIAGNOSIS — L218 Other seborrheic dermatitis: Secondary | ICD-10-CM | POA: Diagnosis not present

## 2015-11-23 DIAGNOSIS — Z85828 Personal history of other malignant neoplasm of skin: Secondary | ICD-10-CM | POA: Diagnosis not present

## 2015-11-23 DIAGNOSIS — L570 Actinic keratosis: Secondary | ICD-10-CM | POA: Diagnosis not present

## 2015-11-23 DIAGNOSIS — Z08 Encounter for follow-up examination after completed treatment for malignant neoplasm: Secondary | ICD-10-CM | POA: Diagnosis not present

## 2015-11-23 DIAGNOSIS — X32XXXD Exposure to sunlight, subsequent encounter: Secondary | ICD-10-CM | POA: Diagnosis not present

## 2016-01-10 ENCOUNTER — Encounter: Payer: Self-pay | Admitting: Vascular Surgery

## 2016-01-16 ENCOUNTER — Encounter: Payer: Self-pay | Admitting: Vascular Surgery

## 2016-01-16 ENCOUNTER — Ambulatory Visit (HOSPITAL_COMMUNITY)
Admission: RE | Admit: 2016-01-16 | Discharge: 2016-01-16 | Disposition: A | Discharge status: Home / Self Care | Payer: Medicare Other | Source: Ambulatory Visit | Attending: Vascular Surgery | Admitting: Vascular Surgery

## 2016-01-16 ENCOUNTER — Ambulatory Visit (INDEPENDENT_AMBULATORY_CARE_PROVIDER_SITE_OTHER): Payer: Medicare Other | Admitting: Vascular Surgery

## 2016-01-16 VITALS — BP 114/58 | HR 78 | Ht 63.0 in | Wt 112.0 lb

## 2016-01-16 DIAGNOSIS — I6522 Occlusion and stenosis of left carotid artery: Secondary | ICD-10-CM | POA: Insufficient documentation

## 2016-01-16 LAB — VAS US CAROTID
LCCADDIAS: 18 cm/s
LCCADSYS: 86 cm/s
LEFT ECA DIAS: -8 cm/s
LICADDIAS: -15 cm/s
LICADSYS: -59 cm/s
LICAPDIAS: 68 cm/s
LICAPSYS: 247 cm/s
Left CCA prox dias: 18 cm/s
Left CCA prox sys: 103 cm/s
RCCAPSYS: 98 cm/s
RIGHT CCA MID DIAS: 20 cm/s
RIGHT ECA DIAS: -7 cm/s
Right CCA prox dias: 21 cm/s
Right cca dist sys: -101 cm/s

## 2016-01-16 NOTE — Progress Notes (Signed)
[facility] [place]  Patient name: Belinda Clark MRN: [id number] DOB: Mar 18, 1940 Sex: female  REASON FOR VISIT: Follow-up asymptomatic left carotid stenosis  HPI: Belinda Clark is a 75 y.o. female here today for follow-up. She remains quite active with no new major difficulties. She's had no cardiac difficulty. She specifically denies any amaurosis fugax, transient ischemic attack or stroke. She is concerned regarding care of her husband. He is returning from a nursing facility after a fall today and she is concerned regarding her ability to care for him. She is quite Small and he weighs over 300 pounds.  Past Medical History:  Diagnosis Date  . Arthritis   . Asthma   . Carotid artery stenosis    40-59 % -Left  . GERD (gastroesophageal reflux disease)   . Glaucoma   . High blood pressure   . HTN (hypertension)   . Hyperlipidemia   . Hypothyroidism     Family History  Problem Relation Age of Onset  . Heart disease    . Diabetes    . Diabetes Mother   . Hypertension Mother   . Hyperlipidemia Mother   . Heart attack Father   . Heart disease Father   . Hyperlipidemia Father   . Stroke Brother   . Heart disease Brother   . Heart attack Brother   . Hyperlipidemia Brother   . Stroke Maternal Grandmother     SOCIAL HISTORY: Social History  Substance Use Topics  . Smoking status: Former Smoker    Packs/day: 1.00    Years: 30.00    Types: Cigarettes    Quit date: 12/26/1993  . Smokeless tobacco: Never Used  . Alcohol use No    Allergies  Allergen Reactions  . Cefuroxime Axetil   . Clarithromycin   . Cleocin [Clindamycin Hcl]   . Codeine   . Cymbalta [Duloxetine Hcl]   . Doxycycline   . Iohexol      Desc: hives   . Levofloxacin   . Metoclopramide Hcl   . Penicillins   . Sulfonamide Derivatives     Current Outpatient Prescriptions  Medication Sig Dispense Refill  . alendronate (FOSAMAX) 70 MG  tablet Take 70 mg by mouth once a week. Take with a full glass of water on an empty stomach.    Marland Kitchen amLODipine (NORVASC) 5 MG tablet Take 5 mg by mouth daily.      . Ascorbic Acid (VITAMIN C) 1000 MG tablet Take 1,000 mg by mouth daily.      Marland Kitchen aspirin 81 MG tablet Take 81 mg by mouth daily.     . Atorvastatin Calcium (LIPITOR PO) Take 40 mg by mouth daily.     . B Complex-C (SUPER B COMPLEX PO) Take 1 tablet by mouth daily.     . calcium carbonate (OS-CAL) 600 MG TABS Take 600 mg in the morning and 1,200 mg at night    . Cholecalciferol 1000 UNITS capsule Take 1,000 Units by mouth daily.    . enalapril (VASOTEC) 20 MG tablet Take 2 tablets (40 mg total) by mouth daily. 180 tablet 3  . Garlic 123XX123 MG CAPS Take 1,000 mg by mouth daily.     . hydrochlorothiazide 25 MG tablet Take 25 mg by mouth daily. Reported on 07/18/2015    . levothyroxine (SYNTHROID, LEVOTHROID) 125 MCG tablet Take 100 mcg by mouth daily before breakfast.     . LORazepam (ATIVAN) 0.5 MG tablet Take 0.5 mg by mouth  as needed for sleep.     . metoprolol succinate (TOPROL-XL) 25 MG 24 hr tablet Take 25 mg by mouth daily.    . montelukast (SINGULAIR) 10 MG tablet Take 10 mg by mouth daily.     . Multiple Vitamin (MULTIVITAMIN) capsule Take 1 capsule by mouth daily.     . Omega-3 Fatty Acids (OMEGA 3 PO) Take 300 mg by mouth daily.     Marland Kitchen omeprazole (PRILOSEC) 20 MG capsule Take 1 tablet by mouth Daily.    Marland Kitchen PROAIR HFA 108 (90 BASE) MCG/ACT inhaler Inhale 2 puffs into the lungs every 4 (four) hours as needed for wheezing or shortness of breath.     . pyridOXINE (VITAMIN B-6) 100 MG tablet Take 100 mg by mouth daily.    . vitamin E 400 UNIT capsule Take 400 Units by mouth daily.       No current facility-administered medications for this visit.     REVIEW OF SYSTEMS:  [X]  denotes positive finding, [ ]  denotes negative finding Cardiac  Comments:  Chest pain or chest pressure:    Shortness of breath upon exertion:    Short of  breath when lying flat:    Irregular heart rhythm:        Vascular    Pain in calf, thigh, or hip brought on by ambulation:    Pain in feet at night that wakes you up from your sleep:     Blood clot in your veins:    Leg swelling:           PHYSICAL EXAM: Vitals:   01/16/16 1121 01/16/16 1124  BP: 129/70 (!) 114/58  Pulse: 72 78  Weight: 112 lb (50.8 kg)   Height: 5\' 3"  (1.6 m)     GENERAL: The patient is a well-nourished female, in no acute distress. The vital signs are documented above. CARDIOVASCULAR: Harsh left carotid bruit and no bruit on the right 2+ radial pulses bilaterally PULMONARY: There is good air exchange  MUSCULOSKELETAL: There are no major deformities or cyanosis. NEUROLOGIC: No focal weakness or paresthesias are detected. SKIN: There are no ulcers or rashes noted. PSYCHIATRIC: The patient has a normal affect.  DATA:  Carotid duplex today reveals no change in her study from 6 months ago. She remains in the 60-79% stenosis on the left and no significant stenosis in her right carotid system  MEDICAL ISSUES: Stable asymptomatic moderate to severe carotid stenosis. Again reviewed symptoms of carotid disease with the patient. She will present immediately should this occur. Otherwise we will see her in 6 months with repeat duplex follow-up    Rosetta Posner, MD [facility] Tel [phone] Pager [phone]

## 2016-01-17 NOTE — Addendum Note (Signed)
Addended by: Lianne Cure A on: 01/17/2016 02:16 PM   Modules accepted: Orders

## 2016-01-25 DIAGNOSIS — M546 Pain in thoracic spine: Secondary | ICD-10-CM | POA: Diagnosis not present

## 2016-01-25 DIAGNOSIS — M545 Low back pain: Secondary | ICD-10-CM | POA: Diagnosis not present

## 2016-01-25 DIAGNOSIS — M62830 Muscle spasm of back: Secondary | ICD-10-CM | POA: Diagnosis not present

## 2016-02-23 DIAGNOSIS — M9903 Segmental and somatic dysfunction of lumbar region: Secondary | ICD-10-CM | POA: Diagnosis not present

## 2016-02-23 DIAGNOSIS — M9905 Segmental and somatic dysfunction of pelvic region: Secondary | ICD-10-CM | POA: Diagnosis not present

## 2016-02-23 DIAGNOSIS — M9902 Segmental and somatic dysfunction of thoracic region: Secondary | ICD-10-CM | POA: Diagnosis not present

## 2016-02-23 DIAGNOSIS — M546 Pain in thoracic spine: Secondary | ICD-10-CM | POA: Diagnosis not present

## 2016-02-27 DIAGNOSIS — M546 Pain in thoracic spine: Secondary | ICD-10-CM | POA: Diagnosis not present

## 2016-02-27 DIAGNOSIS — M9902 Segmental and somatic dysfunction of thoracic region: Secondary | ICD-10-CM | POA: Diagnosis not present

## 2016-02-27 DIAGNOSIS — M9903 Segmental and somatic dysfunction of lumbar region: Secondary | ICD-10-CM | POA: Diagnosis not present

## 2016-02-27 DIAGNOSIS — M9905 Segmental and somatic dysfunction of pelvic region: Secondary | ICD-10-CM | POA: Diagnosis not present

## 2016-02-29 ENCOUNTER — Telehealth (HOSPITAL_COMMUNITY): Payer: Self-pay | Admitting: Internal Medicine

## 2016-02-29 NOTE — Telephone Encounter (Signed)
Patient canceled her appt for 1-05=2018, she is not doing well do to the loss of her husband.

## 2016-03-01 ENCOUNTER — Ambulatory Visit (HOSPITAL_COMMUNITY): Payer: Medicare Other | Admitting: Physical Therapy

## 2016-03-01 DIAGNOSIS — M9902 Segmental and somatic dysfunction of thoracic region: Secondary | ICD-10-CM | POA: Diagnosis not present

## 2016-03-01 DIAGNOSIS — M9903 Segmental and somatic dysfunction of lumbar region: Secondary | ICD-10-CM | POA: Diagnosis not present

## 2016-03-01 DIAGNOSIS — M9905 Segmental and somatic dysfunction of pelvic region: Secondary | ICD-10-CM | POA: Diagnosis not present

## 2016-03-01 DIAGNOSIS — M546 Pain in thoracic spine: Secondary | ICD-10-CM | POA: Diagnosis not present

## 2016-03-05 DIAGNOSIS — M9903 Segmental and somatic dysfunction of lumbar region: Secondary | ICD-10-CM | POA: Diagnosis not present

## 2016-03-05 DIAGNOSIS — M9905 Segmental and somatic dysfunction of pelvic region: Secondary | ICD-10-CM | POA: Diagnosis not present

## 2016-03-05 DIAGNOSIS — M546 Pain in thoracic spine: Secondary | ICD-10-CM | POA: Diagnosis not present

## 2016-03-05 DIAGNOSIS — M9902 Segmental and somatic dysfunction of thoracic region: Secondary | ICD-10-CM | POA: Diagnosis not present

## 2016-03-07 DIAGNOSIS — M9903 Segmental and somatic dysfunction of lumbar region: Secondary | ICD-10-CM | POA: Diagnosis not present

## 2016-03-07 DIAGNOSIS — M546 Pain in thoracic spine: Secondary | ICD-10-CM | POA: Diagnosis not present

## 2016-03-07 DIAGNOSIS — M9905 Segmental and somatic dysfunction of pelvic region: Secondary | ICD-10-CM | POA: Diagnosis not present

## 2016-03-07 DIAGNOSIS — M9902 Segmental and somatic dysfunction of thoracic region: Secondary | ICD-10-CM | POA: Diagnosis not present

## 2016-03-11 DIAGNOSIS — M9902 Segmental and somatic dysfunction of thoracic region: Secondary | ICD-10-CM | POA: Diagnosis not present

## 2016-03-11 DIAGNOSIS — M9905 Segmental and somatic dysfunction of pelvic region: Secondary | ICD-10-CM | POA: Diagnosis not present

## 2016-03-11 DIAGNOSIS — M9903 Segmental and somatic dysfunction of lumbar region: Secondary | ICD-10-CM | POA: Diagnosis not present

## 2016-03-11 DIAGNOSIS — M546 Pain in thoracic spine: Secondary | ICD-10-CM | POA: Diagnosis not present

## 2016-03-14 DIAGNOSIS — M9903 Segmental and somatic dysfunction of lumbar region: Secondary | ICD-10-CM | POA: Diagnosis not present

## 2016-03-14 DIAGNOSIS — M9902 Segmental and somatic dysfunction of thoracic region: Secondary | ICD-10-CM | POA: Diagnosis not present

## 2016-03-14 DIAGNOSIS — M9905 Segmental and somatic dysfunction of pelvic region: Secondary | ICD-10-CM | POA: Diagnosis not present

## 2016-03-14 DIAGNOSIS — M546 Pain in thoracic spine: Secondary | ICD-10-CM | POA: Diagnosis not present

## 2016-03-18 ENCOUNTER — Telehealth (HOSPITAL_COMMUNITY): Payer: Self-pay | Admitting: Internal Medicine

## 2016-03-18 NOTE — Telephone Encounter (Signed)
03/18/16 left a message to reschedule her evaluation when she is ready to do so.

## 2016-03-29 DIAGNOSIS — M9903 Segmental and somatic dysfunction of lumbar region: Secondary | ICD-10-CM | POA: Diagnosis not present

## 2016-03-29 DIAGNOSIS — M546 Pain in thoracic spine: Secondary | ICD-10-CM | POA: Diagnosis not present

## 2016-03-29 DIAGNOSIS — M9902 Segmental and somatic dysfunction of thoracic region: Secondary | ICD-10-CM | POA: Diagnosis not present

## 2016-03-29 DIAGNOSIS — M9905 Segmental and somatic dysfunction of pelvic region: Secondary | ICD-10-CM | POA: Diagnosis not present

## 2016-04-10 ENCOUNTER — Ambulatory Visit (HOSPITAL_COMMUNITY): Payer: Medicare Other | Attending: Internal Medicine | Admitting: Physical Therapy

## 2016-04-10 DIAGNOSIS — M6281 Muscle weakness (generalized): Secondary | ICD-10-CM | POA: Diagnosis not present

## 2016-04-10 DIAGNOSIS — M5414 Radiculopathy, thoracic region: Secondary | ICD-10-CM | POA: Insufficient documentation

## 2016-04-10 DIAGNOSIS — M62830 Muscle spasm of back: Secondary | ICD-10-CM

## 2016-04-10 NOTE — Therapy (Signed)
[place], [place], [address] Phone: [phone]   Fax:  [phone]  Physical Therapy Evaluation  Patient Details  Name: Belinda Clark MRN: [id number] Date of Birth: [birth year] Referring Provider: [employer]  Encounter Date: 04/10/2016      PT End of Session - 04/10/16 1204    Visit Number 1   Number of Visits 12   Date for PT Re-Evaluation 05/10/16   Authorization Type [employer]   Authorization - Visit Number 1   Authorization - Number of Visits 10   PT Start Time 1115   PT Stop Time 1200   PT Time Calculation (min) 45 min   Activity Tolerance Patient tolerated treatment well   Behavior During Therapy [facility] for tasks assessed/performed      Past Medical History:  Diagnosis Date  . Arthritis   . Asthma   . Carotid artery stenosis    40-59 % -Left  . GERD (gastroesophageal reflux disease)   . Glaucoma   . High blood pressure   . HTN (hypertension)   . Hyperlipidemia   . Hypothyroidism     Past Surgical History:  Procedure Laterality Date  . BREAST BIOPSY     x3  . COLONOSCOPY N/A 10/05/2013   Procedure: COLONOSCOPY;  Surgeon: Jamesetta So, MD;  Location: [facility];  Service: Gastroenterology;  Laterality: N/A;  . FRACTURE SURGERY     Spinal Fx 06/30/11  . RHINOPLASTY    . SPINE SURGERY  06/30/11  . THYROIDECTOMY    . TONSILLECTOMY      There were no vitals filed for this visit.       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  She has been having progressive chronic back pain that was aggravated when she lifted an item.  Her pain has not be reduced with chiropractor care therefore she is being referred for skilled physical therapy. The pain is scapular level greater on the left than the right.     Pertinent History HTN, Osteoporosis,past surgery on lumbar area  in 2010   How long can you sit comfortably? able to sit for an hour    How long can you stand comfortably? pt does not stand very long in one place    How long can you walk comfortably? Pt has not been walking   Currently in Pain? Yes  goes as high as a 6/10    Pain Score 4    Pain Location Back   Pain Orientation Mid;Right;Left   Pain Descriptors / Indicators Aching;Throbbing;Spasm   Pain Type Chronic pain  with acute exacerbation    Pain Onset 1 to 4 weeks ago   Pain Frequency Constant  with varying intensity    Aggravating Factors  lifting    Pain Relieving Factors lying down             [facility] PT Assessment - 04/10/16 0001      Assessment   Medical Diagnosis Thoracic back pain    Referring Provider [employer]   Onset Date/Surgical Date 01/26/16   Next MD Visit unscheduled   Prior Therapy chiropractor     Precautions   Precautions None     Restrictions   Weight Bearing Restrictions No     Balance Screen   Has the patient fallen in the past  6 months No   Has the patient had a decrease in activity level because of a fear of falling?  Yes   Is the patient reluctant to leave their home because of a fear of falling?  No     Home Ecologist residence     Prior Function   Level of Independence Independent   Vocation Retired   Leisure walking, dancing     Cognition   Overall Cognitive Status Within Functional Limits for tasks assessed     Observation/Other Assessments   Focus on Therapeutic Outcomes (FOTO)  47     Functional Tests   Functional tests Single leg stance;Sit to Stand     Single Leg Stance   Comments Rt:  11; Lt 9      Sit to Stand   Comments 5x 13.58     Posture/Postural Control   Posture/Postural Control Postural limitations   Postural Limitations Rounded Shoulders;Forward head;Decreased lumbar lordosis;Increased thoracic kyphosis     ROM / Strength   AROM / PROM / Strength AROM;Strength     AROM    AROM Assessment Site Lumbar   Lumbar Flexion --  contraindicated due to osteoporsis    Lumbar Extension 15     Strength   Strength Assessment Site Hip;Knee;Ankle   Right/Left Hip Right;Left   Right Hip Extension 3+/5   Right Hip ABduction 3+/5   Left Hip Extension 3+/5   Left Hip ABduction 3+/5   Right/Left Knee Right;Left   Right Knee Extension 5/5   Left Knee Extension 5/5   Right/Left Ankle Right;Left   Right Ankle Dorsiflexion 4/5   Left Ankle Dorsiflexion 4-/5     Palpation   Palpation comment mm spasms in thoracic paraspinal mm                    [facility] Adult PT Treatment/Exercise - 04/10/16 0001      Exercises   Exercises Lumbar     Lumbar Exercises: Supine   Other Supine Lumbar Exercises decompression exercises 1-5                 PT Education - 04/10/16 1203    Education provided Yes   Education Details proper body mechanics for bed mobility, decompression exercises 1-5    Person(s) Educated Patient   Methods Explanation   Comprehension Verbalized understanding          PT Short Term Goals - 04/10/16 1209      PT SHORT TERM GOAL #1   Title Pt to be walking at least 10 minutes everyday to assist with osteoporosis    Time 2   Period Weeks   Status New     PT SHORT TERM GOAL #2   Title Pt to be able to verbalize and demonstrate correct body mechanics for changing positions as well as lifting to decrease risk of compression fractures   Time 3   Period Weeks   Status New     PT SHORT TERM GOAL #3   Title No paraspinal mm spasms in thoracic spinal area to allow maximum pain of a 3/10 to allow pt to be up for 2-3 hours without having to lie down due to pain    Time 3   Period Weeks   Status New           PT Long Term Goals - 04/10/16 1212      PT LONG TERM GOAL #1   Title Pt  core strength and LE strength to be increased by one grade to allow pt to bring groceries in witnout increased pain.    Time 6   Period Weeks   Status  New     PT LONG TERM GOAL #2   Title Pt to be able to dusty mop her house without increased back pain   Time 6   Period Weeks   Status New     PT LONG TERM GOAL #3   Title Pt to be able to single leg stance on each LE for at least 30 seconds for improved balance to decrease risk of falling    Time 6   Period Weeks   Status New     PT LONG TERM GOAL #4   Title Pt to state that the maximum back pain she has is a 1/10 to allow pt to lift laundry basket and place plates into cabinets without discomfort of her back    Time 6   Period Weeks   Status New               Plan - 04/10/16 1205    Clinical Impression Statement Ms. Lecomte is a 76 yo female who has had intermittent back pain for years.  She developed an acute exacerbation with lifting and has been referred to skilled physical therapy.  Examination demonstrates an extension bias, postural dysfunction, decreased LE and core strength as well as decreased balance.  Ms. Buffkin will benefit from skilled physical therapy to address these issues and maximize her functioning ability.     Rehab Potential Good   PT Frequency 2x / week   PT Duration 6 weeks   PT Treatment/Interventions ADLs/Self Care Home Management;Therapeutic activities;Functional mobility training;Therapeutic exercise;Balance training;Patient/family education;Manual techniques   PT Next Visit Plan begin sitting scapular/cervical retraction, standing extension, and abdominal 6. Progress to education in bodymechanics for both mobiity and lifting and lumbar stability exercises.     PT Home Exercise Plan decompression 1-5; proper way to go from sitting to sidelying to supine.    Consulted and Agree with Plan of Care Patient      Patient will benefit from skilled therapeutic intervention in order to improve the following deficits and impairments:  Decreased activity tolerance, Decreased balance, Decreased strength, Difficulty walking, Increased muscle spasms, Pain,  Postural dysfunction  Visit Diagnosis: Muscle spasm of back - Plan: PT plan of care cert/re-cert  Muscle weakness (generalized) - Plan: PT plan of care cert/re-cert  Radiculopathy, thoracic region - Plan: PT plan of care cert/re-cert      G-Codes - [id number] June 18, 1216    Functional Limitation Carrying, moving and handling objects   Carrying, Moving and Handling Objects Current Status [id number]) At least 40 percent but less than 60 percent impaired, limited or restricted   Carrying, Moving and Handling Objects Goal Status [id number]) At least 20 percent but less than 40 percent impaired, limited or restricted       Problem List Patient Active Problem List   Diagnosis Date Noted  . Fullness of breast 02/14/2014  . Hyperlipidemia 11/04/2012  . Occlusion and stenosis of carotid artery without mention of cerebral infarction 10/03/2011  . Coronary artery disease 01/04/2011  . Hypertension 01/04/2011  . ANKLE SPRAIN 04/30/2007    Rayetta Humphrey, PT CLT [id number] 04/10/2016, 12:22 PM  [place] [address] [place], [place], [address] Phone: [phone]   Fax:  [phone]  Name: ROONEY HARRALSON MRN: [id number] Date of Birth:  05/26/1940  

## 2016-04-16 ENCOUNTER — Ambulatory Visit (HOSPITAL_COMMUNITY): Payer: Medicare Other | Admitting: Physical Therapy

## 2016-04-16 DIAGNOSIS — M5414 Radiculopathy, thoracic region: Secondary | ICD-10-CM | POA: Diagnosis not present

## 2016-04-16 DIAGNOSIS — M62830 Muscle spasm of back: Secondary | ICD-10-CM | POA: Diagnosis not present

## 2016-04-16 DIAGNOSIS — M6281 Muscle weakness (generalized): Secondary | ICD-10-CM | POA: Diagnosis not present

## 2016-04-16 NOTE — Therapy (Addendum)
[facility] [address] [place], [place], [address] Phone: [phone]   Fax:  [phone]  Physical Therapy Treatment  Patient Details  Name: Belinda Clark MRN: [id number] Date of Birth: 12-30-1940 Referring Provider: [employer]  Encounter Date: 04/16/2016      PT End of Session - 04/16/16 0944    Visit Number 2   Number of Visits 12   Date for PT Re-Evaluation 05/10/16   Authorization Type [employer]   Authorization - Visit Number 2   Authorization - Number of Visits 10   PT Start Time 0905   PT Stop Time 0944   PT Time Calculation (min) 39 min   Activity Tolerance Patient tolerated treatment well   Behavior During Therapy [facility] for tasks assessed/performed      Past Medical History:  Diagnosis Date  . Arthritis   . Asthma   . Carotid artery stenosis    40-59 % -Left  . GERD (gastroesophageal reflux disease)   . Glaucoma   . High blood pressure   . HTN (hypertension)   . Hyperlipidemia   . Hypothyroidism     Past Surgical History:  Procedure Laterality Date  . BREAST BIOPSY     x3  . COLONOSCOPY N/A 10/05/2013   Procedure: COLONOSCOPY;  Surgeon: Jamesetta So, MD;  [facility];  Service: Gastroenterology;  Laterality: N/A;  . FRACTURE SURGERY     Spinal Fx 06/30/11  . RHINOPLASTY    . SPINE SURGERY  06/30/11  . THYROIDECTOMY    . TONSILLECTOMY      There were no vitals filed for this visit.      Subjective Assessment - 04/16/16 0909    Subjective Ms. Kaltz states that her back pain comes and goes.  It was giving her a fit yesterday but today it is not to bad.  She has been doing her exercises at home.     Pertinent History HTN, Osteoporosis,past surgery on lumbar area in 2010   How long can you sit comfortably? able to sit for an hour    How long can you stand comfortably? pt does not stand very long in one place    How long can you walk comfortably? Pt has not been walking   Currently in  Pain? No/denies   Pain Onset 1 to 4 weeks ago                         [facility] Adult PT Treatment/Exercise - 04/16/16 0001      Posture/Postural Control   Posture/Postural Control Postural limitations   Postural Limitations Rounded Shoulders;Forward head;Decreased lumbar lordosis;Increased thoracic kyphosis     Exercises   Exercises Lumbar     Lumbar Exercises: Seated   Other Seated Lumbar Exercises scapular retraction, cervica retraction x 10 each    Other Seated Lumbar Exercises feet into floor head to ceiling x 5      Lumbar Exercises: Supine   Other Supine Lumbar Exercises decompression t-band exercises      Manual Therapy   Manual Therapy Soft tissue mobilization   Manual therapy comments done seperate from all other aspects of skilled therapy    Soft tissue mobilization to decrease spasms.                   PT Short Term Goals - 04/16/16 0946      PT SHORT TERM GOAL #1   Title Pt to  be walking at least 10 minutes everyday to assist with osteoporosis    Time 2   Period Weeks   Status On-going     PT SHORT TERM GOAL #2   Title Pt to be able to verbalize and demonstrate correct body mechanics for changing positions as well as lifting to decrease risk of compression fractures   Time 3   Period Weeks   Status On-going     PT SHORT TERM GOAL #3   Title No paraspinal mm spasms in thoracic spinal area to allow maximum pain of a 3/10 to allow pt to be up for 2-3 hours without having to lie down due to pain    Time 3   Period Weeks   Status On-going           PT Long Term Goals - 04/16/16 0946      PT LONG TERM GOAL #1   Title Pt core strength and LE strength to be increased by one grade to allow pt to bring groceries in witnout increased pain.    Time 6   Period Weeks   Status On-going     PT LONG TERM GOAL #2   Title Pt to be able to dusty mop her house without increased back pain   Time 6   Period Weeks   Status On-going     PT  LONG TERM GOAL #3   Title Pt to be able to single leg stance on each LE for at least 30 seconds for improved balance to decrease risk of falling    Time 6   Period Weeks   Status On-going     PT LONG TERM GOAL #4   Title Pt to state that the maximum back pain she has is a 1/10 to allow pt to lift laundry basket and place plates into cabinets without discomfort of her back    Time 6   Period Weeks   Status On-going        Assessment:  Pt and therapist reviewed the need for good posture both sitting and standing for back health.  Pt instructed in t-band decompression exercises with multiple cuing needed for proper technique.  Manual demonstrated  Tight but no muscle spasm.         Plan - 04/16/16 0944    Rehab Potential Good   PT Frequency 2x / week   PT Duration 6 weeks   PT Treatment/Interventions ADLs/Self Care Home Management;Therapeutic activities;Functional mobility training;Therapeutic exercise;Balance training;Patient/family education;Manual techniques   PT Next Visit Plan Begin abdominal "6" as well as prone exercises.     PT Home Exercise Plan decompression 1-5; proper way to go from sitting to sidelying to supine; t-band decompression exercises.    Consulted and Agree with Plan of Care Patient      Patient will benefit from skilled therapeutic intervention in order to improve the following deficits and impairments:  Decreased activity tolerance, Decreased balance, Decreased strength, Difficulty walking, Increased muscle spasms, Pain, Postural dysfunction  Visit Diagnosis: Muscle spasm of back  Muscle weakness (generalized)  Radiculopathy, thoracic region     Problem List Patient Active Problem List   Diagnosis Date Noted  . Fullness of breast 02/14/2014  . Hyperlipidemia 11/04/2012  . Occlusion and stenosis of carotid artery without mention of cerebral infarction 10/03/2011  . Coronary artery disease 01/04/2011  . Hypertension 01/04/2011  . ANKLE SPRAIN  04/30/2007   Rayetta Humphrey, PT CLT [phone] 04/16/2016, 9:47 AM  [facility]  [facility] [address] [place], [place], [address] Phone: [phone]   Fax:  [phone]  Name: Belinda Clark MRN: [id number] Date of Birth: 1940/12/17

## 2016-04-18 ENCOUNTER — Encounter (HOSPITAL_COMMUNITY): Payer: Self-pay

## 2016-04-18 ENCOUNTER — Ambulatory Visit (HOSPITAL_COMMUNITY): Payer: Medicare Other

## 2016-04-18 DIAGNOSIS — M62830 Muscle spasm of back: Secondary | ICD-10-CM

## 2016-04-18 DIAGNOSIS — M5414 Radiculopathy, thoracic region: Secondary | ICD-10-CM

## 2016-04-18 DIAGNOSIS — M6281 Muscle weakness (generalized): Secondary | ICD-10-CM | POA: Diagnosis not present

## 2016-04-18 NOTE — Therapy (Signed)
[facility] [place], [place], [address] Phone: [phone]   Fax:  [phone]  Physical Therapy Treatment  Patient Details  Name: Belinda Clark MRN: [id number] Date of Birth: August 28, 1940 Referring Provider: [employer]  Encounter Date: 04/18/2016      PT End of Session - 04/18/16 1443    Visit Number 3   Number of Visits 12   Date for PT Re-Evaluation 05/10/16   Authorization Type [employer]   Authorization - Visit Number 3   Authorization - Number of Visits 10   PT Start Time [id number]   PT Stop Time [id number]   PT Time Calculation (min) 47 min   Activity Tolerance Patient tolerated treatment well;No increased pain   Behavior During Therapy WFL for tasks assessed/performed      Past Medical History:  Diagnosis Date  . Arthritis   . Asthma   . Carotid artery stenosis    40-59 % -Left  . GERD (gastroesophageal reflux disease)   . Glaucoma   . High blood pressure   . HTN (hypertension)   . Hyperlipidemia   . Hypothyroidism     Past Surgical History:  Procedure Laterality Date  . BREAST BIOPSY     x3  . COLONOSCOPY N/A 10/05/2013   Procedure: COLONOSCOPY;  Surgeon: Jamesetta So, MD;  [facility];  Service: Gastroenterology;  Laterality: N/A;  . FRACTURE SURGERY     Spinal Fx 06/30/11  . RHINOPLASTY    . SPINE SURGERY  06/30/11  . THYROIDECTOMY    . TONSILLECTOMY      There were no vitals filed for this visit.      Subjective Assessment - 04/18/16 1351    Subjective Pt emotional at PT session with grief following her husband's death.  Pt stated she is feeling good today.  No reports                         [facility] Adult PT Treatment/Exercise - 04/18/16 0001      Lumbar Exercises: Seated   Other Seated Lumbar Exercises scapular retraction, cervica retraction x 10 each    Other Seated Lumbar Exercises       Lumbar Exercises: Supine   Ab Set 10 reps;3 seconds  multimodal  cueing to improve contraction   AB Set Limitations 6 point; only x-p and r-h; did not complete 6 point today   Other Supine Lumbar Exercises Reviewed form/technqieu with decompression t-band exercises      Manual Therapy   Manual Therapy Soft tissue mobilization   Manual therapy comments done seperate from all other aspects of skilled therapy    Soft tissue mobilization to decrease spasms thoracic region in prone position                  PT Short Term Goals - 04/16/16 0946      PT SHORT TERM GOAL #1   Title Pt to be walking at least 10 minutes everyday to assist with osteoporosis    Time 2   Period Weeks   Status On-going     PT SHORT TERM GOAL #2   Title Pt to be able to verbalize and demonstrate correct body mechanics for changing positions as well as lifting to decrease risk of compression fractures   Time 3   Period Weeks   Status On-going     PT SHORT TERM GOAL #3   Title No paraspinal mm  spasms in thoracic spinal area to allow maximum pain of a 3/10 to allow pt to be up for 2-3 hours without having to lie down due to pain    Time 3   Period Weeks   Status On-going           PT Long Term Goals - 04/16/16 0946      PT LONG TERM GOAL #1   Title Pt core strength and LE strength to be increased by one grade to allow pt to bring groceries in witnout increased pain.    Time 6   Period Weeks   Status On-going     PT LONG TERM GOAL #2   Title Pt to be able to dusty mop her house without increased back pain   Time 6   Period Weeks   Status On-going     PT LONG TERM GOAL #3   Title Pt to be able to single leg stance on each LE for at least 30 seconds for improved balance to decrease risk of falling    Time 6   Period Weeks   Status On-going     PT LONG TERM GOAL #4   Title Pt to state that the maximum back pain she has is a 1/10 to allow pt to lift laundry basket and place plates into cabinets without discomfort of her back    Time 6   Period Weeks    Status On-going               Plan - 04/18/16 1444    Clinical Impression Statement Pt emotional at entrance following recent husband's death, no reports of pain.  Pt stated she is unsure with technique with decompression theraband exercise.  Reviewed form and including additional notes to improve form at home.  Session focus on improving abdominal contraction with multimodal cueing to activate correct musculature and cueing to improve breathing through session.  EOS with STM to reduce muscle tension in thoracic region to improve posture.  No reports of pain.     Rehab Potential Good   PT Frequency 2x / week   PT Duration 6 weeks   PT Treatment/Interventions ADLs/Self Care Home Management;Therapeutic activities;Functional mobility training;Therapeutic exercise;Balance training;Patient/family education;Manual techniques   PT Next Visit Plan Review confidence with theraband decompression exercises.  Resume abdominal "6" to improve contraction and begin prone exercises.     PT Home Exercise Plan decompression 1-5; proper way to go from sitting to sidelying to supine; t-band decompression exercises.       Patient will benefit from skilled therapeutic intervention in order to improve the following deficits and impairments:  Decreased activity tolerance, Decreased balance, Decreased strength, Difficulty walking, Increased muscle spasms, Pain, Postural dysfunction  Visit Diagnosis: Muscle spasm of back  Muscle weakness (generalized)  Radiculopathy, thoracic region     Problem List Patient Active Problem List   Diagnosis Date Noted  . Fullness of breast 02/14/2014  . Hyperlipidemia 11/04/2012  . Occlusion and stenosis of carotid artery without mention of cerebral infarction 10/03/2011  . Coronary artery disease 01/04/2011  . Hypertension 01/04/2011  . ANKLE SPRAIN 04/30/2007   Ihor Austin, LPTA; [place]  Aldona Lento 04/18/2016, 2:54 PM  [facility] [address] [place], [place], [address] Phone: [phone]   Fax:  [phone]  Name: Belinda Clark MRN: [id number] Date of Birth: March 03, 1940

## 2016-04-23 ENCOUNTER — Ambulatory Visit (HOSPITAL_COMMUNITY): Payer: Medicare Other | Admitting: Physical Therapy

## 2016-04-23 DIAGNOSIS — M62830 Muscle spasm of back: Secondary | ICD-10-CM

## 2016-04-23 DIAGNOSIS — M5414 Radiculopathy, thoracic region: Secondary | ICD-10-CM

## 2016-04-23 DIAGNOSIS — M6281 Muscle weakness (generalized): Secondary | ICD-10-CM | POA: Diagnosis not present

## 2016-04-23 NOTE — Therapy (Signed)
[place], [place], [address] Phone: [phone]   Fax:  [phone]  Physical Therapy Treatment  Patient Details  Name: Belinda Clark MRN: [id number] Date of Birth: 07-Apr-1940 Referring Provider: [employer]  Encounter Date: 04/23/2016      PT End of Session - 04/23/16 0924    Visit Number 4   Number of Visits 12   Date for PT Re-Evaluation 05/10/16   Authorization Type [employer]   Authorization - Visit Number 4   Authorization - Number of Visits 10   PT Start Time 0905   PT Stop Time 0945   PT Time Calculation (min) 40 min   Activity Tolerance Patient tolerated treatment well;No increased pain   Behavior During Therapy WFL for tasks assessed/performed      Past Medical History:  Diagnosis Date  . Arthritis   . Asthma   . Carotid artery stenosis    40-59 % -Left  . GERD (gastroesophageal reflux disease)   . Glaucoma   . High blood pressure   . HTN (hypertension)   . Hyperlipidemia   . Hypothyroidism     Past Surgical History:  Procedure Laterality Date  . BREAST BIOPSY     x3  . COLONOSCOPY N/A 10/05/2013   Procedure: COLONOSCOPY;  Surgeon: Jamesetta So, MD;  [facility];  Service: Gastroenterology;  Laterality: N/A;  . FRACTURE SURGERY     Spinal Fx 06/30/11  . RHINOPLASTY    . SPINE SURGERY  06/30/11  . THYROIDECTOMY    . TONSILLECTOMY      There were no vitals filed for this visit.      Subjective Assessment - 04/23/16 0913    Subjective Pt states that her back mm have been giving her a fit over the weekend.  States that she was not in a very good place emotionally.    Pain Score 3    Pain Location Back   Pain Orientation Upper;Mid   Pain Descriptors / Indicators Aching   Pain Type Chronic pain   Pain Onset More than a month ago   Pain Frequency Constant   Aggravating Factors  stress   Pain Relieving Factors medicatin                          [facility] Adult PT Treatment/Exercise - 04/23/16 0001      Exercises   Exercises Lumbar     Lumbar Exercises: Standing   Scapular Retraction Strengthening;10 reps;Theraband   Theraband Level (Scapular Retraction) Level 2 (Red)   Row Strengthening;10 reps;Theraband   Theraband Level (Row) Level 2 (Red)   Shoulder Extension Strengthening;Both;10 reps   Theraband Level (Shoulder Extension) Level 2 (Red)     Lumbar Exercises: Seated   Other Seated Lumbar Exercises --   Other Seated Lumbar Exercises       Lumbar Exercises: Supine   Ab Set --  multimodal cueing to improve contraction   AB Set Limitations --   Other Supine Lumbar Exercises Reviewed form/technqieu with decompression t-band exercises      Lumbar Exercises: Prone   Other Prone Lumbar Exercises rows, w back and shoulder extension x 10 each      Manual Therapy   Manual Therapy Soft tissue mobilization   Manual therapy comments done seperate from all other aspects of skilled therapy    Soft tissue mobilization to decrease spasms thoracic region in prone position  HMP placed on thoracic area for 8 minutes following manual with no charge.           PT Education - 04/23/16 [phone]    Education provided Yes   Education Details prone exercises as well as standing postrual t-band exercises    Person(s) Educated Patient   Methods Explanation   Comprehension Verbalized understanding          PT Short Term Goals - 04/23/16 [id number]      PT SHORT TERM GOAL #1   Title Pt to be walking at least 10 minutes everyday to assist with osteoporosis    Time 2   Period Weeks   Status On-going     PT SHORT TERM GOAL #2   Title Pt to be able to verbalize and demonstrate correct body mechanics for changing positions as well as lifting to decrease risk of compression fractures   Time 3   Period Weeks   Status On-going     PT SHORT TERM GOAL #3   Title No paraspinal mm spasms in thoracic spinal area to allow maximum pain of a  3/10 to allow pt to be up for 2-3 hours without having to lie down due to pain    Time 3   Period Weeks   Status On-going           PT Long Term Goals - 04/23/16 [id number]      PT LONG TERM GOAL #1   Title Pt core strength and LE strength to be increased by one grade to allow pt to bring groceries in witnout increased pain.    Time 6   Period Weeks   Status On-going     PT LONG TERM GOAL #2   Title Pt to be able to dusty mop her house without increased back pain   Time 6   Period Weeks   Status On-going     PT LONG TERM GOAL #3   Title Pt to be able to single leg stance on each LE for at least 30 seconds for improved balance to decrease risk of falling    Time 6   Period Weeks   Status On-going     PT LONG TERM GOAL #4   Title Pt to state that the maximum back pain she has is a 1/10 to allow pt to lift laundry basket and place plates into cabinets without discomfort of her back    Time 6   Period Weeks   Status On-going               Plan - 04/23/16 [id number]    Clinical Impression Statement Added prone exercises along with standing t-band postural exercises with verbal and manual cuing needed for proper technique.    Rehab Potential Good   PT Frequency 2x / week   PT Duration 6 weeks   PT Treatment/Interventions ADLs/Self Care Home Management;Therapeutic activities;Functional mobility training;Therapeutic exercise;Balance training;Patient/family education;Manual techniques   PT Next Visit Plan review and give pt standing postural exercises review prone exercises.     PT Home Exercise Plan decompression 1-5; proper way to go from sitting to sidelying to supine; t-band decompression exercises.       Patient will benefit from skilled therapeutic intervention in order to improve the following deficits and impairments:  Decreased activity tolerance, Decreased balance, Decreased strength, Difficulty walking, Increased muscle spasms, Pain, Postural dysfunction  Visit  Diagnosis: Muscle spasm of back  Muscle weakness (generalized)  Radiculopathy, thoracic region  Problem List Patient Active Problem List   Diagnosis Date Noted  . Fullness of breast 02/14/2014  . Hyperlipidemia 11/04/2012  . Occlusion and stenosis of carotid artery without mention of cerebral infarction 10/03/2011  . Coronary artery disease 01/04/2011  . Hypertension 01/04/2011  . ANKLE SPRAIN 04/30/2007    Rayetta Humphrey, PT CLT [phone] 04/23/2016, 9:43 AM  [facility] [address] [place], [place], [address] Phone: [phone]   Fax:  [phone]  Name: Belinda Clark MRN: [id number] Date of Birth: 1940-10-31

## 2016-04-25 ENCOUNTER — Ambulatory Visit (HOSPITAL_COMMUNITY): Payer: Medicare Other | Attending: Internal Medicine | Admitting: Physical Therapy

## 2016-04-25 DIAGNOSIS — M62830 Muscle spasm of back: Secondary | ICD-10-CM | POA: Diagnosis not present

## 2016-04-25 DIAGNOSIS — M5414 Radiculopathy, thoracic region: Secondary | ICD-10-CM | POA: Insufficient documentation

## 2016-04-25 DIAGNOSIS — M6281 Muscle weakness (generalized): Secondary | ICD-10-CM | POA: Insufficient documentation

## 2016-04-25 NOTE — Therapy (Signed)
[place], [place], [address] Phone: [phone]   Fax:  [phone]  Physical Therapy Treatment  Patient Details  Name: Belinda Clark MRN: [id number] Date of Birth: 09-03-1940 Referring Provider: [employer]  Encounter Date: 04/25/2016      PT End of Session - 04/25/16 1033    Visit Number 5   Number of Visits 12   Date for PT Re-Evaluation 05/10/16   Authorization Type [employer]   Authorization - Visit Number 5   Authorization - Number of Visits 10   PT Start Time [id number]   PT Stop Time 1030   PT Time Calculation (min) 43 min   Activity Tolerance Patient tolerated treatment well;No increased pain   Behavior During Therapy WFL for tasks assessed/performed      Past Medical History:  Diagnosis Date  . Arthritis   . Asthma   . Carotid artery stenosis    40-59 % -Left  . GERD (gastroesophageal reflux disease)   . Glaucoma   . High blood pressure   . HTN (hypertension)   . Hyperlipidemia   . Hypothyroidism     Past Surgical History:  Procedure Laterality Date  . BREAST BIOPSY     x3  . COLONOSCOPY N/A 10/05/2013   Procedure: COLONOSCOPY;  Surgeon: Jamesetta So, MD;  [facility];  Service: Gastroenterology;  Laterality: N/A;  . FRACTURE SURGERY     Spinal Fx 06/30/11  . RHINOPLASTY    . SPINE SURGERY  06/30/11  . THYROIDECTOMY    . TONSILLECTOMY      There were no vitals filed for this visit.      Subjective Assessment - 04/25/16 0949    Subjective Pt states that she was a mess emotinally yesterday and her back was hurting quite a bit.    Pertinent History HTN, Osteoporosis,past surgery on lumbar area in 2010   How long can you sit comfortably? able to sit for an hour    How long can you stand comfortably? pt does not stand very long in one place    How long can you walk comfortably? Pt has not been walking   Currently in Pain? Yes   Pain Score 4    Pain Location Back   Pain Orientation Lower   Pain Descriptors / Indicators Aching   Pain Type Chronic pain   Pain Onset 1 to 4 weeks ago                         [facility] Adult PT Treatment/Exercise - 04/25/16 0001      Lumbar Exercises: Stretches   Active Hamstring Stretch 3 reps;30 seconds   Single Knee to Chest Stretch 3 reps;30 seconds   Lower Trunk Rotation 5 reps  x2   Pelvic Tilt 5 reps  x19     Lumbar Exercises: Standing   Scapular Retraction Strengthening;10 reps;Theraband   Theraband Level (Scapular Retraction) Level 3 (Green)   Row Strengthening;10 reps;Theraband   Theraband Level (Row) Level 3 (Green)   Shoulder Extension Strengthening;Both;10 reps   Theraband Level (Shoulder Extension) Level 3 (Green)     Lumbar Exercises: Supine   Ab Set 10 reps;3 seconds  multimodal cueing to improve contraction   AB Set Limitations 6 point; only x-p and r-h; did not complete 6 point today   Other Supine Lumbar Exercises decompression t-band exercises      Manual Therapy   Manual Therapy  Soft tissue mobilization   Manual therapy comments done seperate from all other aspects of skilled therapy    Soft tissue mobilization to decrease spasms thoracic region in prone position                PT Education - 04/25/16 1005    Education provided Yes   Education Details New stretches    Person(s) Educated Patient   Methods Explanation   Comprehension Verbalized understanding;Returned demonstration          PT Short Term Goals - 04/23/16 0928      PT SHORT TERM GOAL #1   Title Pt to be walking at least 10 minutes everyday to assist with osteoporosis    Time 2   Period Weeks   Status On-going     PT SHORT TERM GOAL #2   Title Pt to be able to verbalize and demonstrate correct body mechanics for changing positions as well as lifting to decrease risk of compression fractures   Time 3   Period Weeks   Status On-going     PT SHORT TERM GOAL #3   Title No paraspinal mm  spasms in thoracic spinal area to allow maximum pain of a 3/10 to allow pt to be up for 2-3 hours without having to lie down due to pain    Time 3   Period Weeks   Status On-going           PT Long Term Goals - 04/23/16 [id number]      PT LONG TERM GOAL #1   Title Pt core strength and LE strength to be increased by one grade to allow pt to bring groceries in witnout increased pain.    Time 6   Period Weeks   Status On-going     PT LONG TERM GOAL #2   Title Pt to be able to dusty mop her house without increased back pain   Time 6   Period Weeks   Status On-going     PT LONG TERM GOAL #3   Title Pt to be able to single leg stance on each LE for at least 30 seconds for improved balance to decrease risk of falling    Time 6   Period Weeks   Status On-going     PT LONG TERM GOAL #4   Title Pt to state that the maximum back pain she has is a 1/10 to allow pt to lift laundry basket and place plates into cabinets without discomfort of her back    Time 6   Period Weeks   Status On-going               Plan - 04/25/16 1034    Clinical Impression Statement Pt will be going out of town for ten days.  Reviewed t-band postural exercises and gave for HEP as well as stretches.  Pt continues to have a mild to moderate spasm on Lt thoracic paraspinal mm.     Rehab Potential Good   PT Frequency 2x / week   PT Duration 6 weeks   PT Treatment/Interventions ADLs/Self Care Home Management;Therapeutic activities;Functional mobility training;Therapeutic exercise;Balance training;Patient/family education;Manual techniques   PT Next Visit Plan Reassess pt when she returns   PT Home Exercise Plan decompression 1-5; proper way to go from sitting to sidelying to supine; t-band decompression exercises.       Patient will benefit from skilled therapeutic intervention in order to improve the following deficits and impairments:  Decreased activity  tolerance, Decreased balance, Decreased strength,  Difficulty walking, Increased muscle spasms, Pain, Postural dysfunction  Visit Diagnosis: Muscle spasm of back  Muscle weakness (generalized)  Radiculopathy, thoracic region     Problem List Patient Active Problem List   Diagnosis Date Noted  . Fullness of breast 02/14/2014  . Hyperlipidemia 11/04/2012  . Occlusion and stenosis of carotid artery without mention of cerebral infarction 10/03/2011  . Coronary artery disease 01/04/2011  . Hypertension 01/04/2011  . ANKLE SPRAIN 04/30/2007    Rayetta Humphrey, PT CLT [phone] 04/25/2016, 10:37 AM  [facility] [address] [place], [place], [address] Phone: [phone]   Fax:  [phone]  Name: ALANTRA GAYLORD MRN: [id number] Date of Birth: 06/01/1940

## 2016-04-25 NOTE — Patient Instructions (Signed)
Hamstring Stretch: Active    Support behind right knee. Starting with knee bent, attempt to straighten knee until a comfortable stretch is felt in back of thigh. Hold ____ seconds. Repeat ____ times per set. Do ____ sets per session. Do ____ sessions per day.  http://orth.exer.us/159   Copyright  VHI. All rights reserved.  Knee-to-Chest Stretch: Unilateral    With hand behind right knee, pull knee in to chest until a comfortable stretch is felt in lower back and buttocks. Keep back relaxed. Hold ____ seconds. Repeat ____ times per set. Do ____ sets per session. Do ____ sessions per day.  http://orth.exer.us/127   Copyright  VHI. All rights reserved.

## 2016-05-06 ENCOUNTER — Ambulatory Visit (HOSPITAL_COMMUNITY): Payer: Medicare Other | Admitting: Physical Therapy

## 2016-05-08 ENCOUNTER — Telehealth (HOSPITAL_COMMUNITY): Payer: Self-pay | Admitting: Physical Therapy

## 2016-05-08 ENCOUNTER — Ambulatory Visit (HOSPITAL_COMMUNITY): Payer: Medicare Other | Admitting: Physical Therapy

## 2016-05-08 NOTE — Telephone Encounter (Signed)
Called re missed appointment; pt is still in [place] and will return tonight.  Pt will be at her appointment on Monday.  Rayetta Humphrey, [place] CLT [phone]

## 2016-05-13 ENCOUNTER — Ambulatory Visit (HOSPITAL_COMMUNITY): Payer: Medicare Other | Admitting: Physical Therapy

## 2016-05-13 DIAGNOSIS — M6281 Muscle weakness (generalized): Secondary | ICD-10-CM

## 2016-05-13 DIAGNOSIS — M62830 Muscle spasm of back: Secondary | ICD-10-CM

## 2016-05-13 DIAGNOSIS — M5414 Radiculopathy, thoracic region: Secondary | ICD-10-CM

## 2016-05-13 NOTE — Patient Instructions (Signed)
Hamstring Stretch: Active    Support behind right knee. Starting with knee bent, attempt to straighten knee until a comfortable stretch is felt in back of thigh. Hold 30____ seconds. Repeat _3___ times per set. Do __1__ sets per session. Do __2__ sessions per day.  http://orth.exer.us/158   Copyright  VHI. All rights reserved.  Knee-to-Chest Stretch: Unilateral    With hand behind right knee, pull knee in to chest until a comfortable stretch is felt in lower back and buttocks. Keep back relaxed. Hold ____ seconds. Repeat ____ times per set. Do ____ sets per session. Do ____ sessions per day.  http://orth.exer.us/126   Copyright  VHI. All rights reserved.  Scapular Retraction (Standing)    With arms at sides, pinch shoulder blades together. Repeat 10____ times per set. Do _1___ sets per session. Do _2___ sessions per day.  http://orth.exer.us/944   Copyright  VHI. All rights reserved.  Scapular Retraction: Elbow Flexion (Standing)    With elbows bent to 90, pinch shoulder blades together and rotate arms out, keeping elbows bent. Repeat __10__ times per set. Do 1____ sets per session. Do __2__ sessions per day.  http://orth.exer.us/948   Copyright  VHI. All rights reserved.

## 2016-05-13 NOTE — Therapy (Signed)
[place], [place], [address] Phone: [phone]   Fax:  [phone]  Physical Therapy Treatment  Patient Details  Name: Belinda Clark MRN: [id number] Date of Birth: 1940-11-06 Referring Provider: [employer]   Encounter Date: 05/13/2016      PT End of Session - 05/13/16 0852    Visit Number 6   Number of Visits 12   Date for PT Re-Evaluation 06/12/16   Authorization Type [employer]   Authorization - Visit Number 6   Authorization - Number of Visits 12   PT Start Time 0820   PT Stop Time 0900   PT Time Calculation (min) 40 min   Activity Tolerance Patient tolerated treatment well;No increased pain   Behavior During Therapy WFL for tasks assessed/performed      Past Medical History:  Diagnosis Date  . Arthritis   . Asthma   . Carotid artery stenosis    40-59 % -Left  . GERD (gastroesophageal reflux disease)   . Glaucoma   . High blood pressure   . HTN (hypertension)   . Hyperlipidemia   . Hypothyroidism     Past Surgical History:  Procedure Laterality Date  . BREAST BIOPSY     x3  . COLONOSCOPY N/A 10/05/2013   Procedure: COLONOSCOPY;  Surgeon: Jamesetta So, MD;  [facility];  Service: Gastroenterology;  Laterality: N/A;  . FRACTURE SURGERY     Spinal Fx 06/30/11  . RHINOPLASTY    . SPINE SURGERY  06/30/11  . THYROIDECTOMY    . TONSILLECTOMY      There were no vitals filed for this visit.      Subjective Assessment - 05/13/16 0825    Subjective Pt is back from [place].  She states that she has lost all of her exercises during her treatment.  Her pain depends on what she does right now she is pain free.    Pertinent History HTN, Osteoporosis,past surgery on lumbar area in 2010   How long can you sit comfortably? able to sit for an hour    How long can you stand comfortably? pt is able to stand for 10 minutes was not standing  very long in one place    How long can you  walk comfortably? Pt is walking for 10 minutes at a time was not walking   Currently in Pain? No/denies   Pain Onset 1 to 4 weeks ago            [facility] PT Assessment - 05/13/16 0001      Assessment   Medical Diagnosis Thoracic back pain    Referring Provider [employer]    Onset Date/Surgical Date 01/26/16   Next MD Visit unscheduled   Prior Therapy chiropractor     Precautions   Precautions None     Restrictions   Weight Bearing Restrictions No     [place] residence     Prior Function   Level of [place] Retired   Leisure walking, dancing     Cognition   Overall Cognitive Status Within Functional Limits for tasks assessed     Observation/Other Assessments   Focus on Therapeutic Outcomes (FOTO)  47     Functional Tests   Functional tests Single leg stance;Sit to Stand     Single Leg Stance   Comments Rt:  19; Lt 20 was Rt 9 Lt 11  Sit to Stand   Comments 5x 11.71 was 13.58     Posture/Postural Control   Posture/Postural Control Postural limitations   Postural Limitations Rounded Shoulders;Forward head;Decreased lumbar lordosis;Increased thoracic kyphosis     AROM   Lumbar Flexion --  contraindicated due to osteoporsis    Lumbar Extension 15     Strength   Right/Left Hip Right;Left   Right Hip Flexion 4+/5   Right Hip Extension 5/5  was 3+/5    Right Hip ABduction 3+/5   Left Hip Flexion 5/5   Left Hip Extension 5/5  was 3+/5   Left Hip ABduction 5/5  was 3+/5    Right/Left Knee Right;Left   Right Knee Flexion 5/5   Right Knee Extension 5/5   Left Knee Flexion 4/5   Left Knee Extension 5/5   Right Ankle Dorsiflexion 4/5  as 4/5   Left Ankle Dorsiflexion 5/5  was 4-     Palpation   Palpation comment Thoracic mm are tight but no longer spasming                      [facility] Adult PT Treatment/Exercise - 05/13/16 0001      Exercises   Exercises Lumbar      Lumbar Exercises: Aerobic   Stationary Bike --  nustep x 12 minutes for Leg strengthenig      Lumbar Exercises: Standing   Scapular Retraction --   Theraband Level (Scapular Retraction) --   Row --   Theraband Level (Row) --   Shoulder Extension --   Theraband Level (Shoulder Extension) --   Other Standing Lumbar Exercises Single leg stance     Modalities   Modalities Moist Heat     Moist Heat Therapy   Number Minutes Moist Heat 12 Minutes   Moist Heat Location Other (comment)  Thoracic spine                 PT Education - 05/13/16 1309    Education provided Yes   Education Details Pt given new HEP sheets; encouraged to increase walking to at least 20 minutes a day; encouraged pt to go to a gym to work on strengthening as well.  Encouraged pt to have good sitting posture and body mechanics with ADL's.    Person(s) Educated Patient   Methods Explanation;Handout   Comprehension Verbalized understanding          PT Short Term Goals - 05/13/16 0849      PT SHORT TERM GOAL #1   Title Pt to be walking at least 10 minutes everyday to assist with osteoporosis    Time 2   Period Weeks   Status Achieved     PT SHORT TERM GOAL #2   Title Pt to be able to verbalize and demonstrate correct body mechanics for changing positions as well as lifting to decrease risk of compression fractures   Time 3   Period Weeks   Status Partially Met     PT SHORT TERM GOAL #3   Title No paraspinal mm spasms in thoracic spinal area to allow maximum pain of a 3/10 to allow pt to be up for 2-3 hours without having to lie down due to pain   no spasms but pain can still increase to an 8/10    Time 3   Period Weeks   Status Partially Met           PT Long Term Goals - 05/13/16 1856  PT LONG TERM GOAL #1   Title Pt core strength and LE strength to be increased by one grade to allow pt to bring groceries in witnout increased pain.   depends on how much she is carrying    Time 6    Period Weeks   Status Partially Met     PT LONG TERM GOAL #2   Title Pt to be able to dusty mop her house without increased back pain   Time 6   Period Weeks   Status Achieved     PT LONG TERM GOAL #3   Title Pt to be able to single leg stance on each LE for at least 30 seconds for improved balance to decrease risk of falling    Time 6   Period Weeks   Status On-going     PT LONG TERM GOAL #4   Title Pt to state that the maximum back pain she has is a 1/10 to allow pt to lift laundry basket and place plates into cabinets without discomfort of her back    Time 6   Period Weeks   Status Not Met               Plan - 05/13/16 1311    Clinical Impression Statement Ms. Sensabaugh was reassessed today; overall she is doing better although she continues to have weakness and pain in her back.  She has not been to therapy in two weeks due to being out of town.  She will continue to benefit from skilled physical therapy for the remainder of her appointments to maximize her functional level.    Rehab Potential Good   PT Frequency 2x / week   PT Duration 8 weeks  due to being out of town for two weeks    PT Treatment/Interventions ADLs/Self Care Home Management;Therapeutic activities;Functional mobility training;Therapeutic exercise;Balance training;Patient/family education;Manual techniques   PT Next Visit Plan Continue postural strengthening with t-band; prone exercises for back strengthening and manual to decrease tension.     PT Home Exercise Plan decompression 1-5; proper way to go from sitting to sidelying to supine; t-band decompression exercises.       Patient will benefit from skilled therapeutic intervention in order to improve the following deficits and impairments:  Decreased activity tolerance, Decreased balance, Decreased strength, Difficulty walking, Increased muscle spasms, Pain, Postural dysfunction  Visit Diagnosis: Muscle spasm of back  Muscle weakness  (generalized)  Radiculopathy, thoracic region     Problem List Patient Active Problem List   Diagnosis Date Noted  . Fullness of breast 02/14/2014  . Hyperlipidemia 11/04/2012  . Occlusion and stenosis of carotid artery without mention of cerebral infarction 10/03/2011  . Coronary artery disease 01/04/2011  . Hypertension 01/04/2011  . ANKLE SPRAIN 04/30/2007    Rayetta Humphrey, PT CLT [phone] 05/13/2016, 1:20 PM  [facility] [address] [place], [place], [address] Phone: [phone]   Fax:  [phone]  Name: Belinda Clark MRN: [id number] Date of Birth: 09-27-1940

## 2016-05-15 ENCOUNTER — Telehealth (HOSPITAL_COMMUNITY): Payer: Self-pay | Admitting: Internal Medicine

## 2016-05-15 ENCOUNTER — Ambulatory Visit (HOSPITAL_COMMUNITY): Payer: Medicare Other | Admitting: Physical Therapy

## 2016-05-15 NOTE — Telephone Encounter (Signed)
05/15/16 pt left a message that she has been up sick all night and wouldn't be here

## 2016-05-20 ENCOUNTER — Ambulatory Visit (HOSPITAL_COMMUNITY): Payer: Medicare Other | Admitting: Physical Therapy

## 2016-05-20 DIAGNOSIS — M6281 Muscle weakness (generalized): Secondary | ICD-10-CM

## 2016-05-20 DIAGNOSIS — M62830 Muscle spasm of back: Secondary | ICD-10-CM | POA: Diagnosis not present

## 2016-05-20 DIAGNOSIS — M5414 Radiculopathy, thoracic region: Secondary | ICD-10-CM | POA: Diagnosis not present

## 2016-05-20 NOTE — Therapy (Signed)
[place], [place], [address] Phone: [phone]   Fax:  [phone]  Physical Therapy Treatment  Patient Details  Name: Belinda Clark MRN: [id number] Date of Birth: 09-02-1940 Referring Provider: [employer]   Encounter Date: 05/20/2016      PT End of Session - 05/20/16 1056    Visit Number 7   Number of Visits 12   Date for PT Re-Evaluation 06/12/16   Authorization Type [employer]   Authorization - Visit Number 7   Authorization - Number of Visits 12   PT Start Time 1033   PT Stop Time 1113   PT Time Calculation (min) 40 min   Equipment Utilized During Treatment Gait belt   Activity Tolerance Patient tolerated treatment well;No increased pain   Behavior During Therapy WFL for tasks assessed/performed      Past Medical History:  Diagnosis Date  . Arthritis   . Asthma   . Carotid artery stenosis    40-59 % -Left  . GERD (gastroesophageal reflux disease)   . Glaucoma   . High blood pressure   . HTN (hypertension)   . Hyperlipidemia   . Hypothyroidism     Past Surgical History:  Procedure Laterality Date  . BREAST BIOPSY     x3  . COLONOSCOPY N/A 10/05/2013   Procedure: COLONOSCOPY;  Surgeon: Jamesetta So, MD;  [facility];  Service: Gastroenterology;  Laterality: N/A;  . FRACTURE SURGERY     Spinal Fx 06/30/11  . RHINOPLASTY    . SPINE SURGERY  06/30/11  . THYROIDECTOMY    . TONSILLECTOMY      There were no vitals filed for this visit.      Subjective Assessment - 05/20/16 1038    Subjective Pt has been ill so she has not been able to complete her exercises.  She has some questions on her exercises which therapist went over.     Pertinent History HTN, Osteoporosis,past surgery on lumbar area in 2010   How long can you sit comfortably? able to sit for an hour    How long can you stand comfortably? pt is able to stand for 10 minutes was not standing  very long in one place     How long can you walk comfortably? Pt is walking for 10 minutes at a time was not walking   Currently in Pain? Yes  sometimes it doesn't hurt at all    Pain Score 2    Pain Location Back   Pain Orientation Mid   Pain Descriptors / Indicators Aching   Pain Onset 1 to 4 weeks ago   Pain Frequency Intermittent                         [facility] Adult PT Treatment/Exercise - 05/20/16 0001      Posture/Postural Control   Posture/Postural Control Postural limitations   Postural Limitations Rounded Shoulders;Forward head;Decreased lumbar lordosis;Increased thoracic kyphosis     Exercises   Exercises Lumbar     Lumbar Exercises: Stretches   Active Hamstring Stretch --   Single Knee to Chest Stretch --   Lower Trunk Rotation --  x2   Pelvic Tilt --  x19     Lumbar Exercises: Aerobic   Stationary Bike --  nustep x 12 minutes for Leg strengthenig      Lumbar Exercises: Standing   Scapular Retraction Strengthening;10 reps;Theraband   Theraband Level (Scapular  Retraction) Level 3 (Green)   Row Strengthening;10 reps;Theraband   Theraband Level (Row) Level 3 (Green)   Shoulder Extension Strengthening;Both;10 reps   Theraband Level (Shoulder Extension) Level 3 (Green)   Other Standing Lumbar Exercises Single leg stance     Lumbar Exercises: Seated   Sit to Stand 10 reps     Lumbar Exercises: Supine   Ab Set 10 reps;3 seconds  multimodal cueing to improve contraction   AB Set Limitations 6 point; only x-p and r-h; did not complete 6 point today   Bridge 10 reps     Lumbar Exercises: Prone   Straight Leg Raise 10 reps   Other Prone Lumbar Exercises rows with 3# , w back and shoulder extension x 12 each    Other Prone Lumbar Exercises axial e     Modalities   Modalities --     Moist Heat Therapy   Moist Heat Location --     Manual Therapy   Manual Therapy --   Manual therapy comments --   Soft tissue mobilization --                PT Education -  05/20/16 1056    Education provided Yes   Education Details reviewed 6 pt abdominal and added new exercisee   Person(s) Educated Patient   Methods Explanation   Comprehension Verbalized understanding;Returned demonstration          PT Short Term Goals - 05/13/16 0849      PT SHORT TERM GOAL #1   Title Pt to be walking at least 10 minutes everyday to assist with osteoporosis    Time 2   Period Weeks   Status Achieved     PT SHORT TERM GOAL #2   Title Pt to be able to verbalize and demonstrate correct body mechanics for changing positions as well as lifting to decrease risk of compression fractures   Time 3   Period Weeks   Status Partially Met     PT SHORT TERM GOAL #3   Title No paraspinal mm spasms in thoracic spinal area to allow maximum pain of a 3/10 to allow pt to be up for 2-3 hours without having to lie down due to pain   no spasms but pain can still increase to an 8/10    Time 3   Period Weeks   Status Partially Met           PT Long Term Goals - 05/13/16 0851      PT LONG TERM GOAL #1   Title Pt core strength and LE strength to be increased by one grade to allow pt to bring groceries in witnout increased pain.   depends on how much she is carrying    Time 6   Period Weeks   Status Partially Met     PT LONG TERM GOAL #2   Title Pt to be able to dusty mop her house without increased back pain   Time 6   Period Weeks   Status Achieved     PT LONG TERM GOAL #3   Title Pt to be able to single leg stance on each LE for at least 30 seconds for improved balance to decrease risk of falling    Time 6   Period Weeks   Status On-going     PT LONG TERM GOAL #4   Title Pt to state that the maximum back pain she has is a 1/10 to allow pt  to lift laundry basket and place plates into cabinets without discomfort of her back    Time 6   Period Weeks   Status Not Met               Plan - 05/20/16 1057    Clinical Impression Statement Reviewed 6 pt  isometric exercises with the addition of new strengthening.  Pt is progressing as expected.  Pt will need continued skilled therapy to work on her balance and hip strength.    PT Next Visit Plan begin tandem stance and side stepping with t-band exercises as well as wall pushups       Patient will benefit from skilled therapeutic intervention in order to improve the following deficits and impairments:     Visit Diagnosis: Muscle spasm of back  Muscle weakness (generalized)  Radiculopathy, thoracic region     Problem List Patient Active Problem List   Diagnosis Date Noted  . Fullness of breast 02/14/2014  . Hyperlipidemia 11/04/2012  . Occlusion and stenosis of carotid artery without mention of cerebral infarction 10/03/2011  . Coronary artery disease 01/04/2011  . Hypertension 01/04/2011  . ANKLE SPRAIN 04/30/2007    Rayetta Humphrey, PT CLT [phone] 05/20/2016, 11:14 AM  [facility] [place], [place], [address] Phone: [phone]   Fax:  [phone]  Name: Belinda Clark MRN: [id number] Date of Birth: 08-07-1940

## 2016-05-22 ENCOUNTER — Ambulatory Visit (HOSPITAL_COMMUNITY): Payer: Medicare Other | Admitting: Physical Therapy

## 2016-05-22 DIAGNOSIS — M6281 Muscle weakness (generalized): Secondary | ICD-10-CM

## 2016-05-22 DIAGNOSIS — M5414 Radiculopathy, thoracic region: Secondary | ICD-10-CM | POA: Diagnosis not present

## 2016-05-22 DIAGNOSIS — M62830 Muscle spasm of back: Secondary | ICD-10-CM

## 2016-05-22 NOTE — Therapy (Signed)
[place], [place], [address] Phone: [phone]   Fax:  [phone]  Physical Therapy Treatment  Patient Details  Name: Belinda Clark MRN: [id number] Date of Birth: 1940-08-20 Referring Provider: [employer]   Encounter Date: 05/22/2016      PT End of Session - 05/22/16 0928    Visit Number 8   Number of Visits 12   Date for PT Re-Evaluation 06/12/16   Authorization Type [employer]   Authorization - Visit Number 8   Authorization - Number of Visits 12   PT Start Time 0907   PT Stop Time 0949   PT Time Calculation (min) 42 min   Equipment Utilized During Treatment Gait belt   Activity Tolerance Patient tolerated treatment well;No increased pain   Behavior During Therapy WFL for tasks assessed/performed      Past Medical History:  Diagnosis Date  . Arthritis   . Asthma   . Carotid artery stenosis    40-59 % -Left  . GERD (gastroesophageal reflux disease)   . Glaucoma   . High blood pressure   . HTN (hypertension)   . Hyperlipidemia   . Hypothyroidism     Past Surgical History:  Procedure Laterality Date  . BREAST BIOPSY     x3  . COLONOSCOPY N/A 10/05/2013   Procedure: COLONOSCOPY;  Surgeon: Jamesetta So, MD;  [facility];  Service: Gastroenterology;  Laterality: N/A;  . FRACTURE SURGERY     Spinal Fx 06/30/11  . RHINOPLASTY    . SPINE SURGERY  06/30/11  . THYROIDECTOMY    . TONSILLECTOMY      There were no vitals filed for this visit.      Subjective Assessment - 05/22/16 0923    Subjective Pt has started back on her exercises.  Pt has questions on standing postural exercises    Pertinent History HTN, Osteoporosis,past surgery on lumbar area in 2010   How long can you sit comfortably? able to sit for an hour    How long can you stand comfortably? pt is able to stand for 10 minutes was not standing  very long in one place    How long can you walk comfortably? Pt is  walked for 20  minutes today  was not walking   Currently in Pain? Yes   Pain Score 2    Pain Location Back   Pain Orientation Mid   Pain Descriptors / Indicators Aching   Pain Onset 1 to 4 weeks ago   Pain Frequency Intermittent                         [facility] Adult PT Treatment/Exercise - 05/22/16 0001      Posture/Postural Control   Posture/Postural Control Postural limitations   Postural Limitations Rounded Shoulders;Forward head;Decreased lumbar lordosis;Increased thoracic kyphosis     Exercises   Exercises Lumbar                           Lumbar Exercises: Machines for Strengthening   Other Lumbar Machine Exercise rows 1 pl x 10      Lumbar Exercises: Standing   Scapular Retraction Strengthening;15 reps;Theraband   Theraband Level (Scapular Retraction) Level 3 (Green)   Row Strengthening;15 reps;Theraband   Theraband Level (Row) Level 3 (Green)   Shoulder Extension Strengthening;Both;15 reps   Theraband Level (Shoulder Extension) Level 3 (Green)  Other Standing Lumbar Exercises tandem stance with bilateral shoulder extension x 15    Other Standing Lumbar Exercises wall push up and w to Y taking hands off of the wall x 10      Lumbar Exercises: Seated   Sit to Stand 10 reps     Lumbar Exercises: Supine   Ab Set --   AB Set Limitations --   Bridge --     Lumbar Exercises: Prone   Straight Leg Raise --   Other Prone Lumbar Exercises rows with 2# , w back and shoulder extension x 15 each    Other Prone Lumbar Exercises axial e     Manual Therapy   Manual Therapy Soft tissue mobilization   Manual therapy comments done seperate from all other aspects of skilled therapy    Soft tissue mobilization to decrease spasms thoracic region in prone position                  PT Short Term Goals - 05/13/16 0849      PT SHORT TERM GOAL #1   Title Pt to be walking at least 10 minutes everyday to assist with osteoporosis    Time 2   Period  Weeks   Status Achieved     PT SHORT TERM GOAL #2   Title Pt to be able to verbalize and demonstrate correct body mechanics for changing positions as well as lifting to decrease risk of compression fractures   Time 3   Period Weeks   Status Partially Met     PT SHORT TERM GOAL #3   Title No paraspinal mm spasms in thoracic spinal area to allow maximum pain of a 3/10 to allow pt to be up for 2-3 hours without having to lie down due to pain   no spasms but pain can still increase to an 8/10    Time 3   Period Weeks   Status Partially Met           PT Long Term Goals - 05/13/16 0851      PT LONG TERM GOAL #1   Title Pt core strength and LE strength to be increased by one grade to allow pt to bring groceries in witnout increased pain.   depends on how much she is carrying    Time 6   Period Weeks   Status Partially Met     PT LONG TERM GOAL #2   Title Pt to be able to dusty mop her house without increased back pain   Time 6   Period Weeks   Status Achieved     PT LONG TERM GOAL #3   Title Pt to be able to single leg stance on each LE for at least 30 seconds for improved balance to decrease risk of falling    Time 6   Period Weeks   Status On-going     PT LONG TERM GOAL #4   Title Pt to state that the maximum back pain she has is a 1/10 to allow pt to lift laundry basket and place plates into cabinets without discomfort of her back    Time 6   Period Weeks   Status Not Met               Plan - 05/22/16 0929    Clinical Impression Statement Reviewed standing postural t-band exercises with patient.  Added wall push up; w to Y and cybex row with verbal and manual cuing for  proper technique.  Mild mm spasm located at Rt paraspinal mm T5-7 that was able to be abolished with manual techniques.    Rehab Potential Good   PT Frequency 2x / week   PT Duration 8 weeks  due to being out of town for two weeks    PT Treatment/Interventions ADLs/Self Care Home  Management;Therapeutic activities;Functional mobility training;Therapeutic exercise;Balance training;Patient/family education;Manual techniques   PT Next Visit Plan review wall push up and w to Y; begin sitting money exercises.  Update HEP    PT Home Exercise Plan decompression 1-5; proper way to go from sitting to sidelying to supine; t-band decompression exercises.       Patient will benefit from skilled therapeutic intervention in order to improve the following deficits and impairments:  Decreased activity tolerance, Decreased balance, Decreased strength, Difficulty walking, Increased muscle spasms, Pain, Postural dysfunction  Visit Diagnosis: Muscle spasm of back  Muscle weakness (generalized)  Radiculopathy, thoracic region     Problem List Patient Active Problem List   Diagnosis Date Noted  . Fullness of breast 02/14/2014  . Hyperlipidemia 11/04/2012  . Occlusion and stenosis of carotid artery without mention of cerebral infarction 10/03/2011  . Coronary artery disease 01/04/2011  . Hypertension 01/04/2011  . ANKLE SPRAIN 04/30/2007   Rayetta Humphrey, PT CLT [phone] 05/22/2016, 12:15 PM  [facility] [address] [place], [place], [address] Phone: [phone]   Fax:  [phone]  Name: Belinda Clark MRN: [id number] Date of Birth: February 24, 1941

## 2016-05-27 ENCOUNTER — Ambulatory Visit (HOSPITAL_COMMUNITY): Payer: Medicare Other | Attending: Internal Medicine | Admitting: Physical Therapy

## 2016-05-27 DIAGNOSIS — M6281 Muscle weakness (generalized): Secondary | ICD-10-CM | POA: Diagnosis not present

## 2016-05-27 DIAGNOSIS — M62830 Muscle spasm of back: Secondary | ICD-10-CM | POA: Insufficient documentation

## 2016-05-27 DIAGNOSIS — M5414 Radiculopathy, thoracic region: Secondary | ICD-10-CM | POA: Diagnosis not present

## 2016-05-27 NOTE — Patient Instructions (Addendum)
Scapular Retraction (Prone)    Lie with arms at sides. Pinch shoulder blades together and raise arms a few inches from floor. Repeat __10__ times per set. Do __1__ sets per session. Do __2__ sessions per day.  http://orth.exer.us/954   Copyright  VHI. All rights reserved.  Scapular Retraction: Flexion (Prone)    Lie with arms forward. Pinch shoulder blades together and raise arms a few inches from floor. Repeat _10___ times per set. Do __1__ sets per session. Do __2__ sessions per day.  http://orth.exer.us/960   Copyright  VHI. All rights reserved.  Scapular: Retraction (Prone)    Holding _2___ pound weights, keep arms out from sides and elbows bent. Pull elbows back, pinching shoulder blades together. Repeat _10___ times per set. Do __1__ sets per session. Do 2____ sessions per day.  http://orth.exer.us/862   Copyright  VHI. All rights reserved.  On Elbows (Prone)    Rise up on elbows as high as possible, keeping hips on floor. Hold __20__ seconds. Repeat 2____ times per set. Do 2____ sets per session. Do __2__ sessions per day.  http://orth.exer.us/92   Copyright  VHI. All rights reserved.

## 2016-05-27 NOTE — Therapy (Signed)
[place], [place], [address] Phone: [phone]   Fax:  [phone]  Physical Therapy Treatment  Patient Details  Name: Belinda Clark MRN: [id number] Date of Birth: [birth year] Referring Provider: [employer]   Encounter Date: 05/27/2016      PT End of Session - 05/27/16 1035    Visit Number 9   Number of Visits 12   Date for PT Re-Evaluation 06/12/16   Authorization Type [employer]   Authorization - Visit Number 9   Authorization - Number of Visits 12   PT Start Time 1829   PT Stop Time 9371   PT Time Calculation (min) 45 min   Activity Tolerance Patient tolerated treatment well;No increased pain   Behavior During Therapy WFL for tasks assessed/performed      Past Medical History:  Diagnosis Date  . Arthritis   . Asthma   . Carotid artery stenosis    40-59 % -Left  . GERD (gastroesophageal reflux disease)   . Glaucoma   . High blood pressure   . HTN (hypertension)   . Hyperlipidemia   . Hypothyroidism     Past Surgical History:  Procedure Laterality Date  . BREAST BIOPSY     x3  . COLONOSCOPY N/A 10/05/2013   Procedure: COLONOSCOPY;  Surgeon: Jamesetta So, MD;  [facility];  Service: Gastroenterology;  Laterality: N/A;  . FRACTURE SURGERY     Spinal Fx 06/30/11  . RHINOPLASTY    . SPINE SURGERY  06/30/11  . THYROIDECTOMY    . TONSILLECTOMY      There were no vitals filed for this visit.                       [facility] Adult PT Treatment/Exercise - 05/27/16 0001      Lumbar Exercises: Stretches   Prone on Elbows Stretch 3 reps;30 seconds   Press Ups 5 reps     Lumbar Exercises: Standing   Other Standing Lumbar Exercises tandem stance with bilateral shoulder extension x 15    Other Standing Lumbar Exercises wall push up and w to Y taking hands off of the wall x 10      Lumbar Exercises: Seated   Other Seated Lumbar Exercises money x10     Lumbar  Exercises: Prone   Single Arm Raise Right;Left;10 reps   Other Prone Lumbar Exercises rows with 2# , w back and shoulder extension x 15 each    Other Prone Lumbar Exercises axial extension x 10     Manual Therapy   Manual Therapy Soft tissue mobilization   Manual therapy comments done seperate from all other aspects of skilled therapy    Soft tissue mobilization to decrease spasms thoracic region in prone position                  PT Short Term Goals - 05/13/16 0849      PT SHORT TERM GOAL #1   Title Pt to be walking at least 10 minutes everyday to assist with osteoporosis    Time 2   Period Weeks   Status Achieved     PT SHORT TERM GOAL #2   Title Pt to be able to verbalize and demonstrate correct body mechanics for changing positions as well as lifting to decrease risk of compression fractures   Time 3   Period Weeks   Status Partially Met     PT SHORT  TERM GOAL #3   Title No paraspinal mm spasms in thoracic spinal area to allow maximum pain of a 3/10 to allow pt to be up for 2-3 hours without having to lie down due to pain   no spasms but pain can still increase to an 8/10    Time 3   Period Weeks   Status Partially Met           PT Long Term Goals - 05/13/16 0851      PT LONG TERM GOAL #1   Title Pt core strength and LE strength to be increased by one grade to allow pt to bring groceries in witnout increased pain.   depends on how much she is carrying    Time 6   Period Weeks   Status Partially Met     PT LONG TERM GOAL #2   Title Pt to be able to dusty mop her house without increased back pain   Time 6   Period Weeks   Status Achieved     PT LONG TERM GOAL #3   Title Pt to be able to single leg stance on each LE for at least 30 seconds for improved balance to decrease risk of falling    Time 6   Period Weeks   Status On-going     PT LONG TERM GOAL #4   Title Pt to state that the maximum back pain she has is a 1/10 to allow pt to lift laundry  basket and place plates into cabinets without discomfort of her back    Time 6   Period Weeks   Status Not Met               Plan - 05/27/16 1036    Clinical Impression Statement Added Money, and prone single arm raise with good technique noted after instruction.  Noted mm spasm in thoracic paraspinal mm that was obliterated with manual.    Rehab Potential Good   PT Frequency 2x / week   PT Duration 8 weeks  due to being out of town for two weeks    PT Treatment/Interventions ADLs/Self Care Home Management;Therapeutic activities;Functional mobility training;Therapeutic exercise;Balance training;Patient/family education;Manual techniques   PT Next Visit Plan Review money and prone single arm raise.     PT Home Exercise Plan decompression 1-5; proper way to go from sitting to sidelying to supine; t-band decompression exercises.       Patient will benefit from skilled therapeutic intervention in order to improve the following deficits and impairments:  Decreased activity tolerance, Decreased balance, Decreased strength, Difficulty walking, Increased muscle spasms, Pain, Postural dysfunction  Visit Diagnosis: Muscle spasm of back  Muscle weakness (generalized)     Problem List Patient Active Problem List   Diagnosis Date Noted  . Fullness of breast 02/14/2014  . Hyperlipidemia 11/04/2012  . Occlusion and stenosis of carotid artery without mention of cerebral infarction 10/03/2011  . Coronary artery disease 01/04/2011  . Hypertension 01/04/2011  . ANKLE SPRAIN 04/30/2007    Belinda Clark, PT CLT [phone] 05/27/2016, 10:38 AM  [facility] [address] [place], [place], [address] Phone: [phone]   Fax:  [phone]  Name: Belinda Clark MRN: [id number] Date of Birth: 30-Aug-1940

## 2016-05-29 ENCOUNTER — Telehealth (HOSPITAL_COMMUNITY): Payer: Self-pay | Admitting: Internal Medicine

## 2016-05-29 ENCOUNTER — Ambulatory Visit (HOSPITAL_COMMUNITY): Payer: Medicare Other | Admitting: Physical Therapy

## 2016-05-29 DIAGNOSIS — Z1389 Encounter for screening for other disorder: Secondary | ICD-10-CM | POA: Diagnosis not present

## 2016-05-29 DIAGNOSIS — Z0001 Encounter for general adult medical examination with abnormal findings: Secondary | ICD-10-CM | POA: Diagnosis not present

## 2016-05-29 NOTE — Telephone Encounter (Signed)
05/29/16 pt cx said she just wasn't feeling well ... We rescheduled for 4/6

## 2016-05-31 ENCOUNTER — Ambulatory Visit (HOSPITAL_COMMUNITY): Payer: Medicare Other | Admitting: Physical Therapy

## 2016-05-31 DIAGNOSIS — M62830 Muscle spasm of back: Secondary | ICD-10-CM | POA: Diagnosis not present

## 2016-05-31 DIAGNOSIS — M6281 Muscle weakness (generalized): Secondary | ICD-10-CM | POA: Diagnosis not present

## 2016-05-31 DIAGNOSIS — M5414 Radiculopathy, thoracic region: Secondary | ICD-10-CM

## 2016-05-31 NOTE — Therapy (Signed)
[place], [place], [address] Phone: [phone]   Fax:  [phone]  Physical Therapy Treatment  Patient Details  Name: Belinda Clark MRN: [id number] Date of Birth: [birth year] Referring Provider: [employer]   Encounter Date: 05/31/2016      PT End of Session - 05/31/16 1120    Visit Number 10   Number of Visits 12   Date for PT Re-Evaluation 06/12/16   Authorization Type [employer]   Authorization - Visit Number 10   Authorization - Number of Visits 12   PT Start Time 5248   PT Stop Time 1125   PT Time Calculation (min) 48 min   Activity Tolerance Patient tolerated treatment well;No increased pain   Behavior During Therapy WFL for tasks assessed/performed      Past Medical History:  Diagnosis Date  . Arthritis   . Asthma   . Carotid artery stenosis    40-59 % -Left  . GERD (gastroesophageal reflux disease)   . Glaucoma   . High blood pressure   . HTN (hypertension)   . Hyperlipidemia   . Hypothyroidism     Past Surgical History:  Procedure Laterality Date  . BREAST BIOPSY     x3  . COLONOSCOPY N/A 10/05/2013   Procedure: COLONOSCOPY;  Surgeon: Jamesetta So, MD;  [facility];  Service: Gastroenterology;  Laterality: N/A;  . FRACTURE SURGERY     Spinal Fx 06/30/11  . RHINOPLASTY    . SPINE SURGERY  06/30/11  . THYROIDECTOMY    . TONSILLECTOMY      There were no vitals filed for this visit.      Subjective Assessment - 05/31/16 1051    Subjective Pt states that her MD placed her on an antidepression medication.  Pt states she has not taken it yet.  At this time pt is sore but painfree    Pertinent History HTN, Osteoporosis,past surgery on lumbar area in 2010   How long can you sit comfortably? able to sit for an hour    How long can you stand comfortably? pt is able to stand for 10 minutes was not standing  very long in one place    How long can you walk comfortably? Pt is  walked for 20  minutes today  was not walking   Currently in Pain? Yes   Pain Score 2    Pain Location Back   Pain Orientation Upper;Right;Left   Pain Descriptors / Indicators Sore   Pain Onset 1 to 4 weeks ago   Aggravating Factors  activity   Pain Relieving Factors exercise            [facility] PT Assessment - 05/31/16 0001      Observation/Other Assessments   Focus on Therapeutic Outcomes (FOTO)  61  was 26                     [facility] Adult PT Treatment/Exercise - 05/31/16 0001      Exercises   Exercises Lumbar     Lumbar Exercises: Stretches   Prone on Elbows Stretch 3 reps;30 seconds   Press Ups 5 reps     Lumbar Exercises: Aerobic   Stationary Bike --  nustep x 12 minutes for Leg strengthenig no charge.     Lumbar Exercises: Standing   Scapular Retraction Strengthening;15 reps;Theraband   Theraband Level (Scapular Retraction) Level 3 (Green)   Row Strengthening;15 reps;Theraband  Theraband Level (Row) Level 3 (Green)   Shoulder Extension Strengthening;Both;15 reps   Theraband Level (Shoulder Extension) Level 3 (Green)   Other Standing Lumbar Exercises wall push up and w to Y taking hands off of the wall x 10      Lumbar Exercises: Seated   Other Seated Lumbar Exercises money x10     Lumbar Exercises: Prone   Single Arm Raise Right;Left;10 reps   Other Prone Lumbar Exercises rows with 2# , w back and shoulder extension x 15 each    Other Prone Lumbar Exercises axial extension x 10     Manual Therapy   Manual Therapy Soft tissue mobilization   Manual therapy comments done seperate from all other aspects of skilled therapy    Soft tissue mobilization to decrease spasms thoracic region in prone position                  PT Short Term Goals - 05/13/16 0849      PT SHORT TERM GOAL #1   Title Pt to be walking at least 10 minutes everyday to assist with osteoporosis    Time 2   Period Weeks   Status Achieved     PT SHORT TERM GOAL #2    Title Pt to be able to verbalize and demonstrate correct body mechanics for changing positions as well as lifting to decrease risk of compression fractures   Time 3   Period Weeks   Status Partially Met     PT SHORT TERM GOAL #3   Title No paraspinal mm spasms in thoracic spinal area to allow maximum pain of a 3/10 to allow pt to be up for 2-3 hours without having to lie down due to pain   no spasms but pain can still increase to an 8/10    Time 3   Period Weeks   Status Partially Met           PT Long Term Goals - 05/13/16 0851      PT LONG TERM GOAL #1   Title Pt core strength and LE strength to be increased by one grade to allow pt to bring groceries in witnout increased pain.   depends on how much she is carrying    Time 6   Period Weeks   Status Partially Met     PT LONG TERM GOAL #2   Title Pt to be able to dusty mop her house without increased back pain   Time 6   Period Weeks   Status Achieved     PT LONG TERM GOAL #3   Title Pt to be able to single leg stance on each LE for at least 30 seconds for improved balance to decrease risk of falling    Time 6   Period Weeks   Status On-going     PT LONG TERM GOAL #4   Title Pt to state that the maximum back pain she has is a 1/10 to allow pt to lift laundry basket and place plates into cabinets without discomfort of her back    Time 6   Period Weeks   Status Not Met               Plan - 05/31/16 1206    Clinical Impression Statement Reviewed exercises to ensure pt was completing at home with proper technique.  Pt pain and functional ability is improving with foto score improving from 47 t0 61.  Anticipate pt will be ready  for discharge at the end of next week.    Rehab Potential Good   PT Frequency 2x / week   PT Duration 8 weeks  due to being out of town for two weeks    PT Treatment/Interventions ADLs/Self Care Home Management;Therapeutic activities;Functional mobility training;Therapeutic  exercise;Balance training;Patient/family education;Manual techniques   PT Next Visit Plan Begin warrior poses   PT Home Exercise Plan decompression 1-5; proper way to go from sitting to sidelying to supine; t-band decompression exercises.       Patient will benefit from skilled therapeutic intervention in order to improve the following deficits and impairments:  Decreased activity tolerance, Decreased balance, Decreased strength, Difficulty walking, Increased muscle spasms, Pain, Postural dysfunction  Visit Diagnosis: Muscle spasm of back  Muscle weakness (generalized)  Radiculopathy, thoracic region       G-Codes - June 23, 2016 04/28/1206    Functional Assessment Tool Used (Outpatient Only) foto   Functional Limitation Mobility: Walking and moving around   Mobility: Walking and Moving Around Current Status [phone]) At least 20 percent but less than 40 percent impaired, limited or restricted      Problem List Patient Active Problem List   Diagnosis Date Noted  . Fullness of breast 02/14/2014  . Hyperlipidemia 11/04/2012  . Occlusion and stenosis of carotid artery without mention of cerebral infarction 10/03/2011  . Coronary artery disease 01/04/2011  . Hypertension 01/04/2011  . ANKLE SPRAIN 04/30/2007    Rayetta Humphrey, PT CLT [phone] 06-23-2016, 12:09 PM  [facility] [address] [place], [place], [address] Phone: [phone]   Fax:  [phone]  Name: BRILEE PORT MRN: [id number] Date of Birth: 1940/08/13

## 2016-06-03 ENCOUNTER — Ambulatory Visit (HOSPITAL_COMMUNITY): Payer: Medicare Other | Admitting: Physical Therapy

## 2016-06-03 DIAGNOSIS — M6281 Muscle weakness (generalized): Secondary | ICD-10-CM | POA: Diagnosis not present

## 2016-06-03 DIAGNOSIS — M5414 Radiculopathy, thoracic region: Secondary | ICD-10-CM

## 2016-06-03 DIAGNOSIS — M62830 Muscle spasm of back: Secondary | ICD-10-CM | POA: Diagnosis not present

## 2016-06-03 NOTE — Therapy (Signed)
[facility] [place], [place], [address] Phone: [phone]   Fax:  [phone]  Physical Therapy Treatment  Patient Details  Name: Belinda Clark MRN: [id number] Date of Birth: [birth year] Referring Provider: [employer]   Encounter Date: 06/03/2016      PT End of Session - 06/03/16 1101    Visit Number 11   Number of Visits 12   Date for PT Re-Evaluation 06/12/16   Authorization Type [employer]   Authorization - Visit Number 11   Authorization - Number of Visits 12   PT Start Time 3300   PT Stop Time 1115   PT Time Calculation (min) 40 min   Activity Tolerance Patient tolerated treatment well;No increased pain   Behavior During Therapy WFL for tasks assessed/performed      Past Medical History:  Diagnosis Date  . Arthritis   . Asthma   . Carotid artery stenosis    40-59 % -Left  . GERD (gastroesophageal reflux disease)   . Glaucoma   . High blood pressure   . HTN (hypertension)   . Hyperlipidemia   . Hypothyroidism     Past Surgical History:  Procedure Laterality Date  . BREAST BIOPSY     x3  . COLONOSCOPY N/A 10/05/2013   Procedure: COLONOSCOPY;  Surgeon: Jamesetta So, MD;  [facility];  Service: Gastroenterology;  Laterality: N/A;  . FRACTURE SURGERY     Spinal Fx 06/30/11  . RHINOPLASTY    . SPINE SURGERY  06/30/11  . THYROIDECTOMY    . TONSILLECTOMY      There were no vitals filed for this visit.      Subjective Assessment - 06/03/16 1048    Subjective Pt has not taken the medication that her MD gave her due to the fact that she is fearful.  She was busy over the weekend and was unable to complete her exercises.     Pertinent History HTN, Osteoporosis,past surgery on lumbar area in 2010   How long can you sit comfortably? able to sit for an hour    How long can you stand comfortably? pt is able to stand for 10 minutes was not standing  very long in one place    How long can you  walk comfortably? Pt is walked for 20  minutes today  was not walking   Currently in Pain? Yes   Pain Score 3    Pain Location Back   Pain Orientation Upper;Right;Left   Pain Descriptors / Indicators Aching   Pain Type Chronic pain   Pain Onset 1 to 4 weeks ago   Pain Frequency Intermittent                         [facility] Adult PT Treatment/Exercise - 06/03/16 0001      Balance Poses: Yoga   Warrior I 2 reps;30 seconds   Warrior II 2 reps;30 seconds     Lumbar Exercises: Machines for Strengthening   Other Lumbar Machine Exercise rows 1.5 pl x 15     Lumbar Exercises: Standing   Other Standing Lumbar Exercises thumb tack x 1 minute    Other Standing Lumbar Exercises wall push up and w to Y taking hands off of the wall x 10      Lumbar Exercises: Seated   Other Seated Lumbar Exercises money x15     Lumbar Exercises: Prone   Single Arm Raise Right;Left;10  reps   Other Prone Lumbar Exercises rows with 2# , w back and shoulder extension x 15 each      Manual Therapy   Manual Therapy Soft tissue mobilization   Manual therapy comments done seperate from all other aspects of skilled therapy    Soft tissue mobilization to decrease spasms thoracic region in prone position                  PT Short Term Goals - 05/13/16 0849      PT SHORT TERM GOAL #1   Title Pt to be walking at least 10 minutes everyday to assist with osteoporosis    Time 2   Period Weeks   Status Achieved     PT SHORT TERM GOAL #2   Title Pt to be able to verbalize and demonstrate correct body mechanics for changing positions as well as lifting to decrease risk of compression fractures   Time 3   Period Weeks   Status Partially Met     PT SHORT TERM GOAL #3   Title No paraspinal mm spasms in thoracic spinal area to allow maximum pain of a 3/10 to allow pt to be up for 2-3 hours without having to lie down due to pain   no spasms but pain can still increase to an 8/10    Time 3    Period Weeks   Status Partially Met           PT Long Term Goals - 05/13/16 0851      PT LONG TERM GOAL #1   Title Pt core strength and LE strength to be increased by one grade to allow pt to bring groceries in witnout increased pain.   depends on how much she is carrying    Time 6   Period Weeks   Status Partially Met     PT LONG TERM GOAL #2   Title Pt to be able to dusty mop her house without increased back pain   Time 6   Period Weeks   Status Achieved     PT LONG TERM GOAL #3   Title Pt to be able to single leg stance on each LE for at least 30 seconds for improved balance to decrease risk of falling    Time 6   Period Weeks   Status On-going     PT LONG TERM GOAL #4   Title Pt to state that the maximum back pain she has is a 1/10 to allow pt to lift laundry basket and place plates into cabinets without discomfort of her back    Time 6   Period Weeks   Status Not Met               Plan - 06/03/16 1102    Clinical Impression Statement  Added warrior poses as well as thumbtack to HEP with good technique noted.  Pt continues to be emotionally upset about the loss of her husband.  Pt admits that increased pain maybe due to emotional difficulties.     Rehab Potential Good   PT Frequency 2x / week   PT Duration 8 weeks  due to being out of town for two weeks    PT Treatment/Interventions ADLs/Self Care Home Management;Therapeutic activities;Functional mobility training;Therapeutic exercise;Balance training;Patient/family education;Manual techniques   PT Next Visit Plan reassess for possible discharge   PT Home Exercise Plan decompression 1-5; proper way to go from sitting to sidelying to supine; t-band decompression exercises.  Patient will benefit from skilled therapeutic intervention in order to improve the following deficits and impairments:  Decreased activity tolerance, Decreased balance, Decreased strength, Difficulty walking, Increased muscle spasms,  Pain, Postural dysfunction  Visit Diagnosis: Muscle spasm of back  Muscle weakness (generalized)  Radiculopathy, thoracic region     Problem List Patient Active Problem List   Diagnosis Date Noted  . Fullness of breast 02/14/2014  . Hyperlipidemia 11/04/2012  . Occlusion and stenosis of carotid artery without mention of cerebral infarction 10/03/2011  . Coronary artery disease 01/04/2011  . Hypertension 01/04/2011  . ANKLE SPRAIN 04/30/2007    Rayetta Humphrey, PT CLT [phone] 06/03/2016, 11:17 AM  [facility] [place], [place], [address] Phone: [phone]   Fax:  [phone]  Name: DACI STUBBE MRN: [id number] Date of Birth: [birth year]

## 2016-06-05 ENCOUNTER — Ambulatory Visit (HOSPITAL_COMMUNITY): Payer: Medicare Other | Admitting: Physical Therapy

## 2016-06-05 DIAGNOSIS — M6281 Muscle weakness (generalized): Secondary | ICD-10-CM

## 2016-06-05 DIAGNOSIS — M62830 Muscle spasm of back: Secondary | ICD-10-CM

## 2016-06-05 DIAGNOSIS — M5414 Radiculopathy, thoracic region: Secondary | ICD-10-CM | POA: Diagnosis not present

## 2016-06-05 NOTE — Therapy (Signed)
[facility] [place], [place], [address] Phone: [phone]   Fax:  [phone]  Physical Therapy Treatment  Patient Details  Name: Belinda Clark MRN: [id number] Date of Birth: 1940-11-12 Referring Provider: Nicholes Mango  Encounter Date: 06/05/2016      PT End of Session - 06/05/16 1219    Visit Number 12   Number of Visits 12   Date for PT Re-Evaluation 06/12/16   Authorization Type [employer]   Authorization - Visit Number 12   Authorization - Number of Visits 12   PT Start Time 8366   PT Stop Time 1125   PT Time Calculation (min) 45 min   Activity Tolerance Patient tolerated treatment well;No increased pain   Behavior During Therapy WFL for tasks assessed/performed      Past Medical History:  Diagnosis Date  . Arthritis   . Asthma   . Carotid artery stenosis    40-59 % -Left  . GERD (gastroesophageal reflux disease)   . Glaucoma   . High blood pressure   . HTN (hypertension)   . Hyperlipidemia   . Hypothyroidism     Past Surgical History:  Procedure Laterality Date  . BREAST BIOPSY     x3  . COLONOSCOPY N/A 10/05/2013   Procedure: COLONOSCOPY;  Surgeon: Jamesetta So, MD;  [facility];  Service: Gastroenterology;  Laterality: N/A;  . FRACTURE SURGERY     Spinal Fx 06/30/11  . RHINOPLASTY    . SPINE SURGERY  06/30/11  . THYROIDECTOMY    . TONSILLECTOMY      There were no vitals filed for this visit.      Subjective Assessment - 06/05/16 1045    Subjective Pt states that she brought in to much dog food at one time and irritated her back.     Pertinent History HTN, Osteoporosis,past surgery on lumbar area in 2010   How long can you sit comfortably? no problem now was able to sit for an hour    How long can you stand comfortably? pt is able to stand for 15 minutes was 10 minutes at last reassessment.   How long can you walk comfortably? Pt is walked for 20  minutes today  was not  walking   Currently in Pain? Yes   Pain Score 2   can go as high as a 5    Pain Location Back   Pain Orientation Upper   Pain Descriptors / Indicators Aching   Pain Type Chronic pain   Pain Onset 1 to 4 weeks ago   Pain Frequency Intermittent   Aggravating Factors  lifting and bending   Pain Relieving Factors exercises    Effect of Pain on Daily Activities increases             [facility] PT Assessment - 06/05/16 0001      Assessment   Medical Diagnosis Thoracic back pain    Referring Provider Lawerence Fusco   Onset Date/Surgical Date 01/26/16   Next MD Visit unscheduled   Prior Therapy chiropractor     Precautions   Precautions None     Restrictions   Weight Bearing Restrictions No     [place] residence     Prior Function   Level of [place] Retired   Leisure walking, dancing     Cognition   Overall Cognitive Status Within Functional Limits for tasks assessed  Observation/Other Assessments   Focus on Therapeutic Outcomes (FOTO)  47  has not been retaken since 2/14      Functional Tests   Functional tests Single leg stance;Sit to Stand     Single Leg Stance   Comments Rt:  19 was 19; Lt 14 was 20     Sit to Stand   Comments 5x 9.6 was  11.71 initial  13.58     Posture/Postural Control   Posture/Postural Control Postural limitations   Postural Limitations Rounded Shoulders;Forward head;Decreased lumbar lordosis;Increased thoracic kyphosis     AROM   Lumbar Flexion --  contraindicated due to osteoporsis    Lumbar Extension 15     Strength   Right Hip Flexion 4+/5  was 4+   Right Hip Extension 5/5  was 3+/5    Right Hip ABduction 4/5  was 3+/5    Left Hip Flexion 5/5   Left Hip Extension 5/5  was 3+/5   Left Hip ABduction 5/5  was 3+/5    Right Knee Flexion 5/5   Right Knee Extension 5/5   Left Knee Flexion 4/5  ws 4/5    Left Knee Extension 5/5   Right Ankle Dorsiflexion  5/5  as 4/5   Left Ankle Dorsiflexion 5/5  was 4-     Palpation   Palpation comment Thoracic mm are tight but no longer spasming         Effleurage and Petrissage completed to decrease mm tension to decrease pain level to bilateral thoracic  Paraspinal mm.    manual done separate from all other aspects of treatment.                    PT Education - 06/05/16 1226    Education provided Yes   Education Details use of body mechanics for bringing groceries in; the importance of decreasing her load.  The importance of going to a gym.   Person(s) Educated Patient   Methods Explanation;Demonstration;Verbal cues   Comprehension Verbalized understanding          PT Short Term Goals - 06/05/16 1222      PT SHORT TERM GOAL #1   Title Pt to be walking at least 10 minutes everyday to assist with osteoporosis    Time 2   Period Weeks   Status Achieved     PT SHORT TERM GOAL #2   Title Pt to be able to verbalize and demonstrate correct body mechanics for changing positions as well as lifting to decrease risk of compression fractures   Time 3   Period Weeks   Status Achieved     PT SHORT TERM GOAL #3   Title No paraspinal mm spasms in thoracic spinal area to allow maximum pain of a 3/10 to allow pt to be up for 2-3 hours without having to lie down due to pain   no spasms but pain can still increase to an 6/10    Time 3   Period Weeks   Status Partially Met           PT Long Term Goals - 06/05/16 1223      PT LONG TERM GOAL #1   Title Pt core strength and LE strength to be increased by one grade to allow pt to bring groceries in witnout increased pain.   depends on how much she is carrying    Time 6   Period Weeks   Status Partially Met     PT  LONG TERM GOAL #2   Title Pt to be able to dusty mop her house without increased back pain   Time 6   Period Weeks   Status Achieved     PT LONG TERM GOAL #3   Title Pt to be able to single leg stance on each  LE for at least 30 seconds for improved balance to decrease risk of falling    Time 6   Period Weeks   Status On-going     PT LONG TERM GOAL #4   Title Pt to state that the maximum back pain she has is a 1/10 to allow pt to lift laundry basket and place plates into cabinets without discomfort of her back    Time 6   Period Weeks   Status On-going               Plan - 2016/06/26 1219    Clinical Impression Statement Pt reassessed.  Pt is I in all of her HEP.  Her back pain has decreased significantly but is variable based on patients emotional state,(pt husband died in early Feb 22, 2016 and she is having a difficulty time with this).  Pt is no longer in need of skilled physical therapy and will be discharged to a HEP at this time.    Rehab Potential Good   PT Frequency 2x / week   PT Duration 8 weeks  due to being out of town for two weeks    PT Treatment/Interventions ADLs/Self Care Home Management;Therapeutic activities;Functional mobility training;Therapeutic exercise;Balance training;Patient/family education;Manual techniques   PT Next Visit Plan discharge to HEP    PT Home Exercise Plan decompression 1-5; proper way to go from sitting to sidelying to supine; t-band decompression exercises.       Patient will benefit from skilled therapeutic intervention in order to improve the following deficits and impairments:  Decreased activity tolerance, Decreased balance, Decreased strength, Difficulty walking, Increased muscle spasms, Pain, Postural dysfunction  Visit Diagnosis: Muscle spasm of back  Muscle weakness (generalized)  Radiculopathy, thoracic region       G-Codes - 26-Jun-2016 1224    Functional Limitation Carrying, moving and handling objects   Carrying, Moving and Handling Objects Goal Status ([id number]) At least 20 percent but less than 40 percent impaired, limited or restricted   Carrying, Moving and Handling Objects Discharge Status [phone]) At least 20 percent  but less than 40 percent impaired, limited or restricted      Problem List Patient Active Problem List   Diagnosis Date Noted  . Fullness of breast 02/14/2014  . Hyperlipidemia 11/04/2012  . Occlusion and stenosis of carotid artery without mention of cerebral infarction 10/03/2011  . Coronary artery disease 01/04/2011  . Hypertension 01/04/2011  . ANKLE SPRAIN 04/30/2007    Rayetta Humphrey, PT CLT [phone] 26-Jun-2016, 12:27 PM  [facility] [address] [place], [place], [address] Phone: [phone]   Fax:  [phone]  Name: Belinda Clark MRN: [id number] Date of Birth: 1940-11-12  PHYSICAL THERAPY DISCHARGE SUMMARY  Visits from Start of Care: 12  Current functional level related to goals / functional outcomes: See above   Remaining deficits: See above   Education / Equipment: See above  Plan: Patient agrees to discharge.  Patient goals were partially met. Patient is being discharged due to meeting the stated rehab goals.  ?????    Rayetta Humphrey, [place] CLT [phone]

## 2016-06-10 ENCOUNTER — Ambulatory Visit (HOSPITAL_COMMUNITY): Payer: Medicare Other | Admitting: Physical Therapy

## 2016-06-12 ENCOUNTER — Ambulatory Visit (HOSPITAL_COMMUNITY): Payer: Medicare Other | Admitting: Physical Therapy

## 2016-06-21 DIAGNOSIS — Z1389 Encounter for screening for other disorder: Secondary | ICD-10-CM | POA: Diagnosis not present

## 2016-06-21 DIAGNOSIS — E782 Mixed hyperlipidemia: Secondary | ICD-10-CM | POA: Diagnosis not present

## 2016-06-26 DIAGNOSIS — I10 Essential (primary) hypertension: Secondary | ICD-10-CM | POA: Diagnosis not present

## 2016-06-26 DIAGNOSIS — E063 Autoimmune thyroiditis: Secondary | ICD-10-CM | POA: Diagnosis not present

## 2016-06-26 DIAGNOSIS — K219 Gastro-esophageal reflux disease without esophagitis: Secondary | ICD-10-CM | POA: Diagnosis not present

## 2016-07-09 ENCOUNTER — Other Ambulatory Visit: Payer: Self-pay | Admitting: Obstetrics and Gynecology

## 2016-07-09 ENCOUNTER — Encounter: Payer: Self-pay | Admitting: Vascular Surgery

## 2016-07-09 DIAGNOSIS — Z1231 Encounter for screening mammogram for malignant neoplasm of breast: Secondary | ICD-10-CM

## 2016-07-16 ENCOUNTER — Ambulatory Visit (HOSPITAL_COMMUNITY)
Admission: RE | Admit: 2016-07-16 | Discharge: 2016-07-16 | Disposition: A | Discharge status: Home / Self Care | Payer: Medicare Other | Source: Ambulatory Visit | Attending: Vascular Surgery | Admitting: Vascular Surgery

## 2016-07-16 ENCOUNTER — Encounter: Payer: Self-pay | Admitting: Vascular Surgery

## 2016-07-16 ENCOUNTER — Ambulatory Visit (INDEPENDENT_AMBULATORY_CARE_PROVIDER_SITE_OTHER): Payer: Medicare Other | Admitting: Vascular Surgery

## 2016-07-16 VITALS — BP 157/77 | HR 80 | Temp 99.2°F | Resp 16 | Ht 63.0 in | Wt 115.8 lb

## 2016-07-16 DIAGNOSIS — I6522 Occlusion and stenosis of left carotid artery: Secondary | ICD-10-CM

## 2016-07-16 LAB — VAS US CAROTID
LCCADSYS: -115 cm/s
LCCAPDIAS: 17 cm/s
LEFT ECA DIAS: -5 cm/s
LEFT VERTEBRAL DIAS: -13 cm/s
Left CCA dist dias: -21 cm/s
Left CCA prox sys: 113 cm/s
Left ICA dist dias: -25 cm/s
Left ICA dist sys: -102 cm/s
RCCADSYS: -147 cm/s
RCCAPDIAS: 10 cm/s
RCCAPSYS: 100 cm/s
RIGHT CCA MID DIAS: 16 cm/s
RIGHT ECA DIAS: -3 cm/s
RIGHT VERTEBRAL DIAS: 10 cm/s

## 2016-07-16 NOTE — Progress Notes (Signed)
[facility] [place]  Patient name: Belinda Clark MRN: [id number] DOB: Dec 04, 1940 Sex: female  REASON FOR VISIT: Follow-up asymptomatic left carotid stenosis  HPI: Belinda Clark is a 76 y.o. female here today for follow-up. She remains asymptomatic from her carotid disease. My last visit with her 6 months ago her husband passed away. She is a still grieving and is quite tearful when discussing this.  She does remain active. She's had no amaurosis fugax, transient ischemic attack, a fascia or stroke symptoms. She is right-handed. She is concerned about some swelling around her left ankle and also reports some stiffness in her left knee. She is to see an orthopedic specialist tomorrow regarding her knee. To my examination she has minimal edema in both ankles.  Past Medical History:  Diagnosis Date  . Arthritis   . Asthma   . Carotid artery stenosis    40-59 % -Left  . GERD (gastroesophageal reflux disease)   . Glaucoma   . High blood pressure   . HTN (hypertension)   . Hyperlipidemia   . Hypothyroidism     Family History  Problem Relation Age of Onset  . Heart disease Unknown   . Diabetes Unknown   . Diabetes Mother   . Hypertension Mother   . Hyperlipidemia Mother   . Heart attack Father   . Heart disease Father   . Hyperlipidemia Father   . Stroke Brother   . Heart disease Brother   . Heart attack Brother   . Hyperlipidemia Brother   . Stroke Maternal Grandmother     SOCIAL HISTORY: Social History  Substance Use Topics  . Smoking status: Former Smoker    Packs/day: 1.00    Years: 30.00    Types: Cigarettes    Quit date: 12/26/1993  . Smokeless tobacco: Never Used  . Alcohol use No    Allergies  Allergen Reactions  . Cefuroxime Axetil   . Clarithromycin   . Cleocin [Clindamycin Hcl]   . Codeine   . Cymbalta [Duloxetine Hcl]   . Doxycycline   . Iohexol      Desc: hives   . Levofloxacin   .  Metoclopramide Hcl   . Penicillins   . Sulfonamide Derivatives     Current Outpatient Prescriptions  Medication Sig Dispense Refill  . alendronate (FOSAMAX) 70 MG tablet Take 70 mg by mouth once a week. Take with a full glass of water on an empty stomach.    Marland Kitchen amLODipine (NORVASC) 5 MG tablet Take 5 mg by mouth daily.      . Ascorbic Acid (VITAMIN C) 1000 MG tablet Take 1,000 mg by mouth daily.      Marland Kitchen aspirin 81 MG tablet Take 81 mg by mouth daily.     . Atorvastatin Calcium (LIPITOR PO) Take 40 mg by mouth daily.     . B Complex-C (SUPER B COMPLEX PO) Take 1 tablet by mouth daily.     . calcium carbonate (OS-CAL) 600 MG TABS Take 600 mg in the morning and 1,200 mg at night    . Cholecalciferol (D3-1000) 1000 units capsule Take 1,000 Units by mouth daily.    . enalapril (VASOTEC) 20 MG tablet Take 2 tablets (40 mg total) by mouth daily. 180 tablet 3  . Garlic 7416 MG CAPS Take 1,000 mg by mouth daily.     Marland Kitchen ibuprofen (ADVIL,MOTRIN) 100 MG tablet Take 100 mg by mouth every 6 (six) hours as needed for fever.    Marland Kitchen  levothyroxine (SYNTHROID, LEVOTHROID) 125 MCG tablet Take 100 mcg by mouth daily before breakfast.     . LORazepam (ATIVAN) 0.5 MG tablet Take 0.5 mg by mouth as needed for sleep.     . metoprolol succinate (TOPROL-XL) 25 MG 24 hr tablet Take 25 mg by mouth daily.    . montelukast (SINGULAIR) 10 MG tablet Take 10 mg by mouth daily.     . Multiple Vitamin (MULTIVITAMIN) capsule Take 1 capsule by mouth daily.     . Omega-3 Fatty Acids (OMEGA 3 PO) Take 300 mg by mouth daily.     Marland Kitchen omeprazole (PRILOSEC) 20 MG capsule Take 1 tablet by mouth Daily.    Marland Kitchen PROAIR HFA 108 (90 BASE) MCG/ACT inhaler Inhale 2 puffs into the lungs every 4 (four) hours as needed for wheezing or shortness of breath.     . pyridOXINE (VITAMIN B-6) 100 MG tablet Take 100 mg by mouth daily.    . vitamin E 400 UNIT capsule Take 400 Units by mouth daily.       No current facility-administered medications for this  visit.     REVIEW OF SYSTEMS:  [X]  denotes positive finding, [ ]  denotes negative finding Cardiac  Comments:  Chest pain or chest pressure:    Shortness of breath upon exertion:    Short of breath when lying flat:    Irregular heart rhythm:        Vascular    Pain in calf, thigh, or hip brought on by ambulation:    Pain in feet at night that wakes you up from your sleep:     Blood clot in your veins:    Leg swelling:           PHYSICAL EXAM: Vitals:   07/16/16 1553 07/16/16 1558  BP: (!) 148/87 (!) 157/77  Pulse: 80   Resp: 16   Temp: 99.2 F (37.3 C)   TempSrc: Oral   SpO2: 96%   Weight: 115 lb 12.8 oz (52.5 kg)   Height: 5\' 3"  (1.6 m)     GENERAL: The patient is a well-nourished female, in no acute distress. The vital signs are documented above. CARDIOVASCULAR: Carotid arteries without bruits bilaterally. 2+ radial pulses bilaterally PULMONARY: There is good air exchange  MUSCULOSKELETAL: There are no major deformities or cyanosis. NEUROLOGIC: No focal weakness or paresthesias are detected. SKIN: There are no ulcers or rashes noted. PSYCHIATRIC: The patient has a normal affect.  DATA:  Carotid duplex today reveals no change from her study 6 months ago. She does have 60-79% stenosis in her left internal carotid artery with significant calcific plaque. No notes stenosis noted in her right carotid  MEDICAL ISSUES: Stable overall. We'll notify should she develop any neurologic deficits if it is mild. If she has significant deficits she will present immediately to the hospital. Otherwise we will see her again in 6 months with repeat carotid duplex evaluation    Rosetta Posner, MD [facility] Tel [phone] Pager [phone]

## 2016-07-17 ENCOUNTER — Ambulatory Visit (INDEPENDENT_AMBULATORY_CARE_PROVIDER_SITE_OTHER): Payer: Medicare Other | Admitting: Orthopaedic Surgery

## 2016-07-17 ENCOUNTER — Ambulatory Visit (INDEPENDENT_AMBULATORY_CARE_PROVIDER_SITE_OTHER): Payer: Medicare Other

## 2016-07-17 ENCOUNTER — Encounter: Payer: Self-pay | Admitting: Orthopaedic Surgery

## 2016-07-17 VITALS — BP 99/69 | HR 77 | Temp 97.5°F | Resp 18 | Ht 63.0 in | Wt 114.0 lb

## 2016-07-17 DIAGNOSIS — M25572 Pain in left ankle and joints of left foot: Secondary | ICD-10-CM

## 2016-07-17 DIAGNOSIS — M25562 Pain in left knee: Secondary | ICD-10-CM | POA: Diagnosis not present

## 2016-07-17 DIAGNOSIS — G8929 Other chronic pain: Secondary | ICD-10-CM | POA: Diagnosis not present

## 2016-07-17 NOTE — Progress Notes (Addendum)
Patient Belinda Clark, female DOB:11/02/1940, 76 y.o. Belinda Clark  Chief Complaint  Patient presents with  . Knee Pain    Left knee pain    HPI  Belinda Clark is a 76 y.o. female who has left ankle and left knee pain.  I saw her last in November 2015 for right knee pain.  She has developed some left knee pain over the last few months. She has no giving way. She has swelling and popping but no redness.  She has no distal edema.  She has pain in the left ankle at times. She has some slight swelling laterally at times.  She has no redness. She has no new trauma.   HPI  Body mass index is 20.19 kg/m.  ROS  Review of Systems  HENT: Negative for congestion.   Respiratory: Negative for shortness of breath.   Cardiovascular: Negative for chest pain, palpitations and leg swelling.  Endocrine: Positive for cold intolerance.  Musculoskeletal: Positive for arthralgias, gait problem and joint swelling.  Allergic/Immunologic: Positive for environmental allergies.    Past Medical History:  Diagnosis Date  . Arthritis   . Asthma   . Carotid artery stenosis    40-59 % -Left  . GERD (gastroesophageal reflux disease)   . Glaucoma   . High blood pressure   . HTN (hypertension)   . Hyperlipidemia   . Hypothyroidism     Past Surgical History:  Procedure Laterality Date  . BREAST BIOPSY     x3  . COLONOSCOPY N/A 10/05/2013   Procedure: COLONOSCOPY;  Surgeon: Jamesetta So, MD;  Location: [facility];  Service: Gastroenterology;  Laterality: N/A;  . FRACTURE SURGERY     Spinal Fx 06/30/11  . RHINOPLASTY    . SPINE SURGERY  06/30/11  . THYROIDECTOMY    . TONSILLECTOMY      Family History  Problem Relation Age of Onset  . Diabetes Mother   . Hypertension Mother   . Hyperlipidemia Mother   . Heart attack Father   . Heart disease Father   . Hyperlipidemia Father   . Heart disease Unknown   . Diabetes Unknown   . Stroke Brother   . Heart disease Brother   . Heart  attack Brother   . Hyperlipidemia Brother   . Stroke Maternal Grandmother     Social History Social History  Substance Use Topics  . Smoking status: Former Smoker    Packs/day: 1.00    Years: 30.00    Types: Cigarettes    Quit date: 12/26/1993  . Smokeless tobacco: Never Used  . Alcohol use No    Allergies  Allergen Reactions  . Cefuroxime Axetil   . Clarithromycin   . Cleocin [Clindamycin Hcl]   . Codeine   . Cymbalta [Duloxetine Hcl]   . Doxycycline   . Iohexol      Desc: hives   . Levofloxacin   . Metoclopramide Hcl   . Penicillins   . Sulfonamide Derivatives     Current Outpatient Prescriptions  Medication Sig Dispense Refill  . alendronate (FOSAMAX) 70 MG tablet Take 70 mg by mouth once a week. Take with a full glass of water on an empty stomach.    Marland Kitchen amLODipine (NORVASC) 5 MG tablet Take 5 mg by mouth daily.      . Ascorbic Acid (VITAMIN C) 1000 MG tablet Take 1,000 mg by mouth daily.      Marland Kitchen aspirin 81 MG tablet Take 81 mg by mouth daily.     Marland Kitchen  Atorvastatin Calcium (LIPITOR PO) Take 40 mg by mouth daily.     . B Complex-C (SUPER B COMPLEX PO) Take 1 tablet by mouth daily.     . calcium carbonate (OS-CAL) 600 MG TABS Take 600 mg in the morning and 1,200 mg at night    . Cholecalciferol (D3-1000) 1000 units capsule Take 1,000 Units by mouth daily.    . enalapril (VASOTEC) 20 MG tablet Take 2 tablets (40 mg total) by mouth daily. 180 tablet 3  . Garlic 3557 MG CAPS Take 1,000 mg by mouth daily.     Marland Kitchen ibuprofen (ADVIL,MOTRIN) 100 MG tablet Take 100 mg by mouth every 6 (six) hours as needed for fever.    . levothyroxine (SYNTHROID, LEVOTHROID) 125 MCG tablet Take 100 mcg by mouth daily before breakfast.     . LORazepam (ATIVAN) 0.5 MG tablet Take 0.5 mg by mouth as needed for sleep.     . metoprolol succinate (TOPROL-XL) 25 MG 24 hr tablet Take 25 mg by mouth daily.    . montelukast (SINGULAIR) 10 MG tablet Take 10 mg by mouth daily.     . Multiple Vitamin  (MULTIVITAMIN) capsule Take 1 capsule by mouth daily.     . Omega-3 Fatty Acids (OMEGA 3 PO) Take 300 mg by mouth daily.     Marland Kitchen omeprazole (PRILOSEC) 20 MG capsule Take 1 tablet by mouth Daily.    Marland Kitchen PROAIR HFA 108 (90 BASE) MCG/ACT inhaler Inhale 2 puffs into the lungs every 4 (four) hours as needed for wheezing or shortness of breath.     . pyridOXINE (VITAMIN B-6) 100 MG tablet Take 100 mg by mouth daily.    . vitamin E 400 UNIT capsule Take 400 Units by mouth daily.       No current facility-administered medications for this visit.      Physical Exam  Blood pressure 99/69, pulse 77, temperature 97.5 F (36.4 C), resp. rate 18, height 5\' 3"  (1.6 m), weight 114 lb (51.7 kg).  Constitutional: overall normal hygiene, normal nutrition, well developed, normal grooming, normal body habitus. Assistive device:none  Musculoskeletal: gait and station Limp left, muscle tone and strength are normal, no tremors or atrophy is present.  .  Neurological: coordination overall normal.  Deep tendon reflex/nerve stretch intact.  Sensation normal.  Cranial nerves II-XII intact.   Skin:   Normal overall no scars, lesions, ulcers or rashes. No psoriasis.  Psychiatric: Alert and oriented x 3.  Recent memory intact, remote memory unclear.  Normal mood and affect. Well groomed.  Good eye contact.  Cardiovascular: overall no swelling, no varicosities, no edema bilaterally, normal temperatures of the legs and arms, no clubbing, cyanosis and good capillary refill.  Lymphatic: palpation is normal.  The left lower extremity is examined:  Inspection:  Thigh:  Non-tender and no defects  Knee has swelling 1+ effusion.                        Joint tenderness is present                        Patient is tender over the medial joint line  Lower Leg:  Has normal appearance and no tenderness or defects  Ankle:  Non-tender and no defects  Foot:  Non-tender and no defects Range of Motion:  Knee:  Range of motion  is: 0-110  Crepitus is  present  Ankle:  Range of motion is normal. Strength and Tone:  The left lower extremity has normal strength and tone. Stability:  Knee:  The knee is stable.  Ankle:  The ankle is stable.  The left ankle has no swelling or redness.  ROM is full.  NV intact.  The patient has been educated about the nature of the problem(s) and counseled on treatment options.  The patient appeared to understand what I have discussed and is in agreement with it.  Encounter Diagnoses  Name Primary?  . Chronic pain of left knee Yes  . Pain of joint of left ankle and foot    She declines injection into left knee.  X-rays were done of the left knee and left ankle, reported separately.  PLAN Call if any problems.  Precautions discussed.  Continue current medications.   Return to clinic 1 month   Electronically Signed Sanjuana Kava, MD 5/23/20183:25 PM

## 2016-07-17 NOTE — Addendum Note (Signed)
Addended by: Lianne Cure A on: 07/17/2016 09:52 AM   Modules accepted: Orders

## 2016-07-24 DIAGNOSIS — H40013 Open angle with borderline findings, low risk, bilateral: Secondary | ICD-10-CM | POA: Diagnosis not present

## 2016-07-24 DIAGNOSIS — H524 Presbyopia: Secondary | ICD-10-CM | POA: Diagnosis not present

## 2016-07-24 DIAGNOSIS — H02401 Unspecified ptosis of right eyelid: Secondary | ICD-10-CM | POA: Diagnosis not present

## 2016-07-24 DIAGNOSIS — H2513 Age-related nuclear cataract, bilateral: Secondary | ICD-10-CM | POA: Diagnosis not present

## 2016-07-31 ENCOUNTER — Ambulatory Visit (HOSPITAL_COMMUNITY)
Admission: RE | Admit: 2016-07-31 | Discharge: 2016-07-31 | Disposition: A | Discharge status: Home / Self Care | Payer: Medicare Other | Source: Ambulatory Visit | Attending: Obstetrics and Gynecology | Admitting: Obstetrics and Gynecology

## 2016-07-31 DIAGNOSIS — Z1231 Encounter for screening mammogram for malignant neoplasm of breast: Secondary | ICD-10-CM | POA: Diagnosis not present

## 2016-08-14 ENCOUNTER — Ambulatory Visit: Payer: Medicare Other | Admitting: Orthopaedic Surgery

## 2016-09-19 DIAGNOSIS — E039 Hypothyroidism, unspecified: Secondary | ICD-10-CM | POA: Diagnosis not present

## 2016-10-10 DIAGNOSIS — H25811 Combined forms of age-related cataract, right eye: Secondary | ICD-10-CM | POA: Diagnosis not present

## 2016-10-10 DIAGNOSIS — H25011 Cortical age-related cataract, right eye: Secondary | ICD-10-CM | POA: Diagnosis not present

## 2016-10-10 DIAGNOSIS — H2511 Age-related nuclear cataract, right eye: Secondary | ICD-10-CM | POA: Diagnosis not present

## 2016-10-21 DIAGNOSIS — I25110 Atherosclerotic heart disease of native coronary artery with unstable angina pectoris: Secondary | ICD-10-CM | POA: Diagnosis not present

## 2017-01-02 DIAGNOSIS — H25012 Cortical age-related cataract, left eye: Secondary | ICD-10-CM | POA: Diagnosis not present

## 2017-01-02 DIAGNOSIS — H25812 Combined forms of age-related cataract, left eye: Secondary | ICD-10-CM | POA: Diagnosis not present

## 2017-01-02 DIAGNOSIS — H2512 Age-related nuclear cataract, left eye: Secondary | ICD-10-CM | POA: Diagnosis not present

## 2017-01-21 ENCOUNTER — Encounter: Payer: Self-pay | Admitting: Vascular Surgery

## 2017-01-21 ENCOUNTER — Ambulatory Visit (HOSPITAL_COMMUNITY)
Admission: RE | Admit: 2017-01-21 | Discharge: 2017-01-21 | Disposition: A | Discharge status: Home / Self Care | Payer: Medicare Other | Source: Ambulatory Visit | Attending: Vascular Surgery | Admitting: Vascular Surgery

## 2017-01-21 ENCOUNTER — Ambulatory Visit: Payer: Medicare Other | Admitting: Vascular Surgery

## 2017-01-21 VITALS — BP 109/67 | HR 79 | Temp 98.4°F | Resp 20 | Ht 63.0 in | Wt 115.0 lb

## 2017-01-21 DIAGNOSIS — I6523 Occlusion and stenosis of bilateral carotid arteries: Secondary | ICD-10-CM | POA: Diagnosis not present

## 2017-01-21 DIAGNOSIS — I6522 Occlusion and stenosis of left carotid artery: Secondary | ICD-10-CM

## 2017-01-21 DIAGNOSIS — I6529 Occlusion and stenosis of unspecified carotid artery: Secondary | ICD-10-CM

## 2017-01-21 DIAGNOSIS — Z01810 Encounter for preprocedural cardiovascular examination: Secondary | ICD-10-CM

## 2017-01-21 LAB — VAS US CAROTID
LCCAPDIAS: 25 cm/s
LCCAPSYS: 140 cm/s
LEFT ECA DIAS: 0 cm/s
LEFT VERTEBRAL DIAS: 28 cm/s
LICAPSYS: 388 cm/s
Left CCA dist dias: -19 cm/s
Left CCA dist sys: -97 cm/s
Left ICA dist dias: -31 cm/s
Left ICA dist sys: -107 cm/s
Left ICA prox dias: 103 cm/s
RCCADSYS: -122 cm/s
RCCAPDIAS: 17 cm/s
RCCAPSYS: 126 cm/s
RIGHT CCA MID DIAS: 31 cm/s
RIGHT ECA DIAS: 0 cm/s
RIGHT VERTEBRAL DIAS: 14 cm/s

## 2017-01-21 MED ORDER — DIPHENHYDRAMINE HCL 50 MG PO CAPS: ORAL_CAPSULE | ORAL | 0 refills | Status: DC

## 2017-01-21 MED ORDER — PREDNISONE 50 MG PO TABS: ORAL_TABLET | ORAL | 0 refills | Status: DC

## 2017-01-21 NOTE — Progress Notes (Signed)
[facility] [place]  Patient name: Belinda Clark MRN: [id number] DOB: 01-10-1941 Sex: female  REASON FOR VISIT: Follow-up of asymptomatic left carotid stenosis  HPI: Belinda Clark is a 76 y.o. female here today for follow-up.  She fortunately has remained completely asymptomatic regarding her stenosis.  This is been followed for quite some time.  She has had higher end of the 60-79.  She continues to be upset regarding the death of her husband just under 1 year ago.  She is tearful and states that this is her first holiday season without him.  She does report bilateral cataract surgery since my last visit with her as well.  Past Medical History:  Diagnosis Date  . Arthritis   . Asthma   . Carotid artery stenosis    40-59 % -Left  . GERD (gastroesophageal reflux disease)   . Glaucoma   . High blood pressure   . HTN (hypertension)   . Hyperlipidemia   . Hypothyroidism     Family History  Problem Relation Age of Onset  . Diabetes Mother   . Hypertension Mother   . Hyperlipidemia Mother   . Heart attack Father   . Heart disease Father   . Hyperlipidemia Father   . Heart disease Unknown   . Diabetes Unknown   . Stroke Brother   . Heart disease Brother   . Heart attack Brother   . Hyperlipidemia Brother   . Stroke Maternal Grandmother     SOCIAL HISTORY: Social History   Tobacco Use  . Smoking status: Former Smoker    Packs/day: 1.00    Years: 30.00    Pack years: 30.00    Types: Cigarettes    Last attempt to quit: 12/26/1993    Years since quitting: 23.0  . Smokeless tobacco: Never Used  Substance Use Topics  . Alcohol use: No    Allergies  Allergen Reactions  . Cefuroxime Axetil   . Clarithromycin   . Cleocin [Clindamycin Hcl]   . Codeine   . Cymbalta [Duloxetine Hcl]   . Doxycycline   . Iohexol      Desc: hives   . Levofloxacin   . Metoclopramide Hcl   . Penicillins   . Sulfonamide  Derivatives     Current Outpatient Medications  Medication Sig Dispense Refill  . alendronate (FOSAMAX) 70 MG tablet Take 70 mg by mouth once a week. Take with a full glass of water on an empty stomach.    Marland Kitchen amLODipine (NORVASC) 5 MG tablet Take 5 mg by mouth daily.      . Ascorbic Acid (VITAMIN C) 1000 MG tablet Take 1,000 mg by mouth daily.      Marland Kitchen aspirin 81 MG tablet Take 81 mg by mouth daily.     . Atorvastatin Calcium (LIPITOR PO) Take 40 mg by mouth daily.     . B Complex-C (SUPER B COMPLEX PO) Take 1 tablet by mouth daily.     . calcium carbonate (OS-CAL) 600 MG TABS Take 600 mg in the morning and 1,200 mg at night    . Cholecalciferol (D3-1000) 1000 units capsule Take 1,000 Units by mouth daily.    . enalapril (VASOTEC) 20 MG tablet Take 2 tablets (40 mg total) by mouth daily. 180 tablet 3  . Garlic 2202 MG CAPS Take 1,000 mg by mouth daily.     Marland Kitchen ibuprofen (ADVIL,MOTRIN) 100 MG tablet Take 100 mg by mouth every 6 (six) hours as  needed for fever.    . levothyroxine (SYNTHROID, LEVOTHROID) 125 MCG tablet Take 100 mcg by mouth daily before breakfast.     . LORazepam (ATIVAN) 0.5 MG tablet Take 0.5 mg by mouth as needed for sleep.     . metoprolol succinate (TOPROL-XL) 25 MG 24 hr tablet Take 25 mg by mouth daily.    . montelukast (SINGULAIR) 10 MG tablet Take 10 mg by mouth daily.     . Multiple Vitamin (MULTIVITAMIN) capsule Take 1 capsule by mouth daily.     . Omega-3 Fatty Acids (OMEGA 3 PO) Take 300 mg by mouth daily.     Marland Kitchen omeprazole (PRILOSEC) 20 MG capsule Take 1 tablet by mouth Daily.    Marland Kitchen PROAIR HFA 108 (90 BASE) MCG/ACT inhaler Inhale 2 puffs into the lungs every 4 (four) hours as needed for wheezing or shortness of breath.     . pyridOXINE (VITAMIN B-6) 100 MG tablet Take 100 mg by mouth daily.    . vitamin E 400 UNIT capsule Take 400 Units by mouth daily.      . diphenhydrAMINE (BENADRYL) 50 MG capsule Take 50 mg by mouth 1 hour prior to your procedure. 1 capsule 0  .  predniSONE (DELTASONE) 50 MG tablet One tablet (50mg ) 13 hours prior to procedure; one tablet (50mg ) 7 hours prior to procedure and then one tablet (50 mg) one hour prior to procedure. 3 tablet 0   No current facility-administered medications for this visit.     REVIEW OF SYSTEMS:  [X]  denotes positive finding, [ ]  denotes negative finding Cardiac  Comments:  Chest pain or chest pressure:    Shortness of breath upon exertion:    Short of breath when lying flat:    Irregular heart rhythm:        Vascular    Pain in calf, thigh, or hip brought on by ambulation:    Pain in feet at night that wakes you up from your sleep:     Blood clot in your veins:    Leg swelling:           PHYSICAL EXAM: Vitals:   01/21/17 1541 01/21/17 1544  BP: (!) 97/58 109/67  Pulse: 79   Resp: 20   Temp: 98.4 F (36.9 C)   TempSrc: Oral   SpO2: 95%   Weight: 115 lb (52.2 kg)   Height: 5\' 3"  (1.6 m)     GENERAL: The patient is a well-nourished female, in no acute distress. The vital signs are documented above. CARDIOVASCULAR: 2+ radial pulses bilaterally.  Does have a left carotid bruit and no bruit on the right. PULMONARY: There is good air exchange  MUSCULOSKELETAL: There are no major deformities or cyanosis. NEUROLOGIC: No focal weakness or paresthesias are detected. SKIN: There are no ulcers or rashes noted. PSYCHIATRIC: The patient has a normal affect.  DATA:  Duplex today reveals 60-79% stenosis.  There has been increased velocity since her last study 6 months ago with end-diastolic velocities now of 103 cm/s.  She has extremely calcified plaque which may obscure higher velocities.  MEDICAL ISSUES: Moderate to severe asymptomatic left carotid stenosis with progression over time.  I have recommended a CT angiogram for further delineation of her carotid stenosis.  She does have a history of hives associated with IVP dye.  She does report that she has had subsequent IV contrast with Benadryl and  this has been successful without hives.  We will obtain outpatient CT angiogram of her  neck and then I will see her for further discussion.  Explained that if this does show high-grade stenosis would recommend elective left carotid endarterectomy.    Rosetta Posner, MD FACS [facility] Tel [phone] Pager [phone]

## 2017-01-22 ENCOUNTER — Other Ambulatory Visit: Payer: Self-pay

## 2017-01-22 MED ORDER — PREDNISONE 50 MG PO TABS: ORAL_TABLET | ORAL | 0 refills | Status: DC

## 2017-01-22 MED ORDER — DIPHENHYDRAMINE HCL 50 MG PO CAPS: ORAL_CAPSULE | ORAL | 0 refills | Status: DC

## 2017-01-23 ENCOUNTER — Other Ambulatory Visit: Payer: Self-pay

## 2017-01-23 DIAGNOSIS — Z01818 Encounter for other preprocedural examination: Secondary | ICD-10-CM

## 2017-01-24 ENCOUNTER — Other Ambulatory Visit (HOSPITAL_COMMUNITY)
Admission: RE | Admit: 2017-01-24 | Discharge: 2017-01-24 | Disposition: A | Discharge status: Home / Self Care | Payer: Medicare Other | Source: Ambulatory Visit | Attending: Vascular Surgery | Admitting: Vascular Surgery

## 2017-01-24 ENCOUNTER — Other Ambulatory Visit: Payer: Self-pay | Admitting: *Deleted

## 2017-01-24 DIAGNOSIS — Z01818 Encounter for other preprocedural examination: Secondary | ICD-10-CM | POA: Insufficient documentation

## 2017-01-24 LAB — BUN: BUN: 14 mg/dL (ref 6–20)

## 2017-01-24 LAB — CREATININE, SERUM
CREATININE: 0.61 mg/dL (ref 0.44–1.00)
GFR calc non Af Amer: >60 mL/min (ref >60)

## 2017-01-27 ENCOUNTER — Ambulatory Visit
Admission: RE | Admit: 2017-01-27 | Discharge: 2017-01-27 | Disposition: A | Discharge status: Home / Self Care | Payer: Medicare Other | Source: Ambulatory Visit | Attending: Vascular Surgery | Admitting: Vascular Surgery

## 2017-01-27 DIAGNOSIS — I6523 Occlusion and stenosis of bilateral carotid arteries: Secondary | ICD-10-CM | POA: Diagnosis not present

## 2017-01-27 DIAGNOSIS — I6529 Occlusion and stenosis of unspecified carotid artery: Secondary | ICD-10-CM

## 2017-01-27 DIAGNOSIS — Z01810 Encounter for preprocedural cardiovascular examination: Secondary | ICD-10-CM

## 2017-01-27 MED ORDER — IOPAMIDOL (ISOVUE-370) INJECTION 76%
75.0000 mL | Freq: Once | INTRAVENOUS | Status: AC | PRN
  Administered 2017-01-27: 75 mL via INTRAVENOUS

## 2017-01-28 ENCOUNTER — Ambulatory Visit: Payer: Medicare Other | Admitting: Vascular Surgery

## 2017-01-28 ENCOUNTER — Encounter: Payer: Self-pay | Admitting: *Deleted

## 2017-01-28 ENCOUNTER — Encounter: Payer: Self-pay | Admitting: Vascular Surgery

## 2017-01-28 VITALS — BP 140/73 | HR 90 | Resp 18 | Ht 63.0 in | Wt 117.0 lb

## 2017-01-28 DIAGNOSIS — I6522 Occlusion and stenosis of left carotid artery: Secondary | ICD-10-CM

## 2017-01-28 NOTE — Progress Notes (Signed)
[facility] [place]  Patient name: Belinda Clark MRN: [id number] DOB: 17-Nov-1940 Sex: female  REASON FOR VISIT: Follow-up of left carotid stenosis, asymptomatic.    HPI: Belinda Clark is a 76 y.o. female here today for continued discussion of her asymptomatic carotid disease.  I had seen her duplex had suggested progression to a critical degree of stenosis.  Fortunately she is remained asymptomatic.  She is right-handed and this is her left.  She underwent a CT angiogram on 01/27/2017 and I have reviewed her films and is discussing this here with her today  Past Medical History:  Diagnosis Date  . Arthritis   . Asthma   . Carotid artery stenosis    40-59 % -Left  . GERD (gastroesophageal reflux disease)   . Glaucoma   . High blood pressure   . HTN (hypertension)   . Hyperlipidemia   . Hypothyroidism     Family History  Problem Relation Age of Onset  . Diabetes Mother   . Hypertension Mother   . Hyperlipidemia Mother   . Heart attack Father   . Heart disease Father   . Hyperlipidemia Father   . Heart disease Unknown   . Diabetes Unknown   . Stroke Brother   . Heart disease Brother   . Heart attack Brother   . Hyperlipidemia Brother   . Stroke Maternal Grandmother     SOCIAL HISTORY: Social History   Tobacco Use  . Smoking status: Former Smoker    Packs/day: 1.00    Years: 30.00    Pack years: 30.00    Types: Cigarettes    Last attempt to quit: 12/26/1993    Years since quitting: 23.1  . Smokeless tobacco: Never Used  Substance Use Topics  . Alcohol use: No    Allergies  Allergen Reactions  . Ciprofloxacin Hives  . Cleocin [Clindamycin Hcl] Hives  . Clindamycin Hcl Hives  . Iodinated Diagnostic Agents Hives    Iohexol  . Sulfa Antibiotics Hives  . Cefuroxime Axetil   . Clarithromycin   . Codeine   . Cymbalta [Duloxetine Hcl]   . Doxycycline   . Levofloxacin   . Metoclopramide Hcl   .  Penicillins     Current Outpatient Medications  Medication Sig Dispense Refill  . alendronate (FOSAMAX) 70 MG tablet Take 70 mg by mouth once a week. Take with a full glass of water on an empty stomach.    Marland Kitchen amLODipine (NORVASC) 5 MG tablet Take 5 mg by mouth daily.      . Ascorbic Acid (VITAMIN C) 1000 MG tablet Take 1,000 mg by mouth daily.      Marland Kitchen aspirin 81 MG tablet Take 81 mg by mouth daily.     . Atorvastatin Calcium (LIPITOR PO) Take 40 mg by mouth daily.     . B Complex-C (SUPER B COMPLEX PO) Take 1 tablet by mouth daily.     . calcium carbonate (OS-CAL) 600 MG TABS Take 600 mg in the morning and 1,200 mg at night    . Cholecalciferol (D3-1000) 1000 units capsule Take 1,000 Units by mouth daily.    . diphenhydrAMINE (BENADRYL) 50 MG capsule Take 50 mg by mouth 1 hour prior to your procedure. 1 capsule 0  . enalapril (VASOTEC) 20 MG tablet Take 2 tablets (40 mg total) by mouth daily. 180 tablet 3  . Garlic 4742 MG CAPS Take 1,000 mg by mouth daily.     Marland Kitchen ibuprofen (  ADVIL,MOTRIN) 100 MG tablet Take 100 mg by mouth every 6 (six) hours as needed for fever.    . levothyroxine (SYNTHROID, LEVOTHROID) 125 MCG tablet Take 100 mcg by mouth daily before breakfast.     . LORazepam (ATIVAN) 0.5 MG tablet Take 0.5 mg by mouth as needed for sleep.     . metoprolol succinate (TOPROL-XL) 25 MG 24 hr tablet Take 25 mg by mouth daily.    . montelukast (SINGULAIR) 10 MG tablet Take 10 mg by mouth daily.     . Multiple Vitamin (MULTIVITAMIN) capsule Take 1 capsule by mouth daily.     . Omega-3 Fatty Acids (OMEGA 3 PO) Take 300 mg by mouth daily.     Marland Kitchen omeprazole (PRILOSEC) 20 MG capsule Take 1 tablet by mouth Daily.    . predniSONE (DELTASONE) 50 MG tablet One tablet (50mg ) 13 hours prior to procedure; one tablet (50mg ) 7 hours prior to procedure and then one tablet (50 mg) one hour prior to procedure. 3 tablet 0  . PROAIR HFA 108 (90 BASE) MCG/ACT inhaler Inhale 2 puffs into the lungs every 4 (four)  hours as needed for wheezing or shortness of breath.     . pyridOXINE (VITAMIN B-6) 100 MG tablet Take 100 mg by mouth daily.    . vitamin E 400 UNIT capsule Take 400 Units by mouth daily.       No current facility-administered medications for this visit.     REVIEW OF SYSTEMS:  [X]  denotes positive finding, [ ]  denotes negative finding Cardiac  Comments:  Chest pain or chest pressure:    Shortness of breath upon exertion:    Short of breath when lying flat:    Irregular heart rhythm:        Vascular    Pain in calf, thigh, or hip brought on by ambulation:    Pain in feet at night that wakes you up from your sleep:     Blood clot in your veins:    Leg swelling:           PHYSICAL EXAM: Vitals:   01/28/17 1400  BP: 140/73  Pulse: 90  Resp: 18  SpO2: 96%  Weight: 117 lb (53.1 kg)  Height: 5\' 3"  (1.6 m)    GENERAL: The patient is a well-nourished female, in no acute distress. The vital signs are documented above. PULMONARY: There is good air exchange  MUSCULOSKELETAL: There are no major deformities or cyanosis. NEUROLOGIC: No focal weakness or paresthesias are detected. SKIN: There are no ulcers or rashes noted. PSYCHIATRIC: The patient has a normal affect.  DATA:  CT angiogram shows focal high-grade stenosis at her right carotid bifurcation.  This is extremely calcified and irregular.  She does have diffuse calcification but no critical stenosis in her right carotid system.  MEDICAL ISSUES: High-grade asymptomatic left internal carotid artery stenosis.  I discussed the significance of this at length with the patient again today.  I did explain that this puts her at approximately 5 %/year risk of transient ischemic ischemic attack or stroke.  Explained that from a statistical standpoint she would benefit from left carotid endarterectomy.  Explained the procedure is as expected 1 night hospitalization and a 1-1/2% risk of stroke with surgery.  She does report that she  occasionally has some tightness in her chest when she gets angry.  She has follow-up with her cardiologist in February and we will will asked to move this up for preoperative cardiac clearance.  Once  we obtain this we will schedule her for elective left carotid endarterectomy    Rosetta Posner, MD [facility] Tel [phone] Pager [phone]

## 2017-01-29 ENCOUNTER — Other Ambulatory Visit: Payer: Self-pay | Admitting: *Deleted

## 2017-02-05 ENCOUNTER — Encounter: Payer: Self-pay | Admitting: Orthopaedic Surgery

## 2017-02-05 ENCOUNTER — Ambulatory Visit: Payer: Medicare Other | Admitting: Orthopaedic Surgery

## 2017-02-05 ENCOUNTER — Ambulatory Visit (INDEPENDENT_AMBULATORY_CARE_PROVIDER_SITE_OTHER): Payer: Medicare Other

## 2017-02-05 VITALS — BP 124/66 | HR 90 | Ht 63.0 in | Wt 115.0 lb

## 2017-02-05 DIAGNOSIS — G8929 Other chronic pain: Secondary | ICD-10-CM

## 2017-02-05 DIAGNOSIS — M5441 Lumbago with sciatica, right side: Secondary | ICD-10-CM | POA: Diagnosis not present

## 2017-02-05 DIAGNOSIS — M4856XA Collapsed vertebra, not elsewhere classified, lumbar region, initial encounter for fracture: Secondary | ICD-10-CM | POA: Diagnosis not present

## 2017-02-05 DIAGNOSIS — M5116 Intervertebral disc disorders with radiculopathy, lumbar region: Secondary | ICD-10-CM | POA: Diagnosis not present

## 2017-02-05 DIAGNOSIS — M25551 Pain in right hip: Secondary | ICD-10-CM

## 2017-02-05 DIAGNOSIS — M2578 Osteophyte, vertebrae: Secondary | ICD-10-CM | POA: Diagnosis not present

## 2017-02-05 DIAGNOSIS — M4726 Other spondylosis with radiculopathy, lumbar region: Secondary | ICD-10-CM | POA: Diagnosis not present

## 2017-02-05 MED ORDER — TRAMADOL HCL 50 MG PO TABS: 50.0000 mg | ORAL_TABLET | Freq: Four times a day (QID) | ORAL | 1 refills | Status: DC | PRN

## 2017-02-05 NOTE — Progress Notes (Signed)
Patient NU:[id number] Belinda Clark, female DOB:December 12, 1940, 76 y.o. [id number]  Chief Complaint  Patient presents with  . Follow-up    RIGHT KNEE AND HIP PAIN    HPI  Belinda Clark is a 76 y.o. female who has right lower back and right buttock and hip pain.  It has been bothering her more for several weeks and got worse with the recent snow and cold.  She has no trauma.  She has some paresthesia on the right at times.  She has no weakness.  She has no redness.  She has tried ice, heat, rubs with little help.  She is scheduled for left carotid surgery next Wednesday, a week from today.  I am limited in any treatment with NSAIDs because of that.  She had compression fracture in 2013 and evaluated by Dr. Joya Salm with myelogram.    She is emotional today.  After the death of her husband she feels alone.   HPI  Body mass index is 20.37 kg/m.  ROS  Review of Systems  HENT: Negative for congestion.   Respiratory: Negative for shortness of breath.   Cardiovascular: Negative for chest pain, palpitations and leg swelling.  Endocrine: Positive for cold intolerance.  Musculoskeletal: Positive for arthralgias, gait problem and joint swelling.  Allergic/Immunologic: Positive for environmental allergies.  All other systems reviewed and are negative.   Past Medical History:  Diagnosis Date  . Arthritis   . Asthma   . Carotid artery stenosis    40-59 % -Left  . GERD (gastroesophageal reflux disease)   . Glaucoma   . High blood pressure   . HTN (hypertension)   . Hyperlipidemia   . Hypothyroidism     Past Surgical History:  Procedure Laterality Date  . BREAST BIOPSY     x3  . COLONOSCOPY N/A 10/05/2013   Procedure: COLONOSCOPY;  Surgeon: Jamesetta So, MD;  Location: [facility];  Service: Gastroenterology;  Laterality: N/A;  . FRACTURE SURGERY     Spinal Fx 06/30/11  . RHINOPLASTY    . SPINE SURGERY  06/30/11  . THYROIDECTOMY    . TONSILLECTOMY      Family History  Problem  Relation Age of Onset  . Diabetes Mother   . Hypertension Mother   . Hyperlipidemia Mother   . Heart attack Father   . Heart disease Father   . Hyperlipidemia Father   . Heart disease Unknown   . Diabetes Unknown   . Stroke Brother   . Heart disease Brother   . Heart attack Brother   . Hyperlipidemia Brother   . Stroke Maternal Grandmother     Social History Social History   Tobacco Use  . Smoking status: Former Smoker    Packs/day: 1.00    Years: 30.00    Pack years: 30.00    Types: Cigarettes    Last attempt to quit: 12/26/1993    Years since quitting: 23.1  . Smokeless tobacco: Never Used  Substance Use Topics  . Alcohol use: No  . Drug use: No    Allergies  Allergen Reactions  . Ciprofloxacin Hives  . Cleocin [Clindamycin Hcl] Hives  . Clindamycin Hcl Hives  . Iodinated Diagnostic Agents Hives    Iohexol  . Sulfa Antibiotics Hives  . Cefuroxime Axetil   . Clarithromycin   . Codeine   . Cymbalta [Duloxetine Hcl]   . Doxycycline   . Levofloxacin   . Metoclopramide Hcl   . Penicillins     Current Outpatient  Medications  Medication Sig Dispense Refill  . alendronate (FOSAMAX) 70 MG tablet Take 70 mg by mouth once a week. Take with a full glass of water on an empty stomach.    Marland Kitchen amLODipine (NORVASC) 5 MG tablet Take 5 mg by mouth daily.      . Ascorbic Acid (VITAMIN C) 1000 MG tablet Take 1,000 mg by mouth daily.      Marland Kitchen aspirin 81 MG tablet Take 81 mg by mouth daily.     . Atorvastatin Calcium (LIPITOR PO) Take 40 mg by mouth daily.     . B Complex-C (SUPER B COMPLEX PO) Take 1 tablet by mouth daily.     . calcium carbonate (OS-CAL) 600 MG TABS Take 600 mg in the morning and 1,200 mg at night    . Cholecalciferol (D3-1000) 1000 units capsule Take 1,000 Units by mouth daily.    . diphenhydrAMINE (BENADRYL) 50 MG capsule Take 50 mg by mouth 1 hour prior to your procedure. 1 capsule 0  . enalapril (VASOTEC) 20 MG tablet Take 2 tablets (40 mg total) by mouth  daily. 180 tablet 3  . Garlic 1324 MG CAPS Take 1,000 mg by mouth daily.     Marland Kitchen ibuprofen (ADVIL,MOTRIN) 100 MG tablet Take 100 mg by mouth every 6 (six) hours as needed for fever.    . levothyroxine (SYNTHROID, LEVOTHROID) 125 MCG tablet Take 100 mcg by mouth daily before breakfast.     . LORazepam (ATIVAN) 0.5 MG tablet Take 0.5 mg by mouth as needed for sleep.     . metoprolol succinate (TOPROL-XL) 25 MG 24 hr tablet Take 25 mg by mouth daily.    . montelukast (SINGULAIR) 10 MG tablet Take 10 mg by mouth daily.     . Multiple Vitamin (MULTIVITAMIN) capsule Take 1 capsule by mouth daily.     . Omega-3 Fatty Acids (OMEGA 3 PO) Take 300 mg by mouth daily.     Marland Kitchen omeprazole (PRILOSEC) 20 MG capsule Take 1 tablet by mouth Daily.    . predniSONE (DELTASONE) 50 MG tablet One tablet (50mg ) 13 hours prior to procedure; one tablet (50mg ) 7 hours prior to procedure and then one tablet (50 mg) one hour prior to procedure. 3 tablet 0  . PROAIR HFA 108 (90 BASE) MCG/ACT inhaler Inhale 2 puffs into the lungs every 4 (four) hours as needed for wheezing or shortness of breath.     . pyridOXINE (VITAMIN B-6) 100 MG tablet Take 100 mg by mouth daily.    . vitamin E 400 UNIT capsule Take 400 Units by mouth daily.       No current facility-administered medications for this visit.      Physical Exam  Blood pressure 124/66, pulse 90, height 5\' 3"  (1.6 m), weight 115 lb (52.2 kg).  Constitutional: overall normal hygiene, normal nutrition, well developed, normal grooming, normal body habitus. Assistive device:none  Musculoskeletal: gait and station Limp right, muscle tone and strength are normal, no tremors or atrophy is present.  .  Neurological: coordination overall normal.  Deep tendon reflex/nerve stretch intact.  Sensation normal.  Cranial nerves II-XII intact.   Skin:   Normal overall no scars, lesions, ulcers or rashes. No psoriasis.  Psychiatric: Alert and oriented x 3.  Recent memory intact, remote  memory unclear.  Normal mood and affect. Well groomed.  Good eye contact.  Cardiovascular: overall no swelling, no varicosities, no edema bilaterally, normal temperatures of the legs and arms, no clubbing, cyanosis and  good capillary refill.  Lymphatic: palpation is normal.  All other systems reviewed and are negative   Spine/Pelvis examination:  Inspection:  Overall, sacoiliac joint benign and hips nontender; without crepitus or defects.   Thoracic spine inspection: Alignment normal without kyphosis present   Lumbar spine inspection:  Alignment  with normal lumbar lordosis, with mid scoliosis apparent.   Thoracic spine palpation:  without tenderness of spinal processes   Lumbar spine palpation: with tenderness of lumbar area; without tightness of lumbar muscles    Range of Motion:   Lumbar flexion, forward flexion is 45 without pain or tenderness    Lumbar extension is 5 without pain or tenderness   Left lateral bend is Normal  without pain or tenderness   Right lateral bend is Normal without pain or tenderness   Straight leg raising is Normal   Strength & tone: Normal   Stability overall normal stability    X-rays were done of the lumbar spine and right hip, reported separately.  The patient has been educated about the nature of the problem(s) and counseled on treatment options.  The patient appeared to understand what I have discussed and is in agreement with it.  Encounter Diagnoses  Name Primary?  . Chronic right-sided low back pain with right-sided sciatica Yes  . Pain of right hip joint     PLAN Call if any problems.  Precautions discussed.  Continue current medications.   Return to clinic 3 weeks  She has surgery next week and other appointments.  We are closed the week of Christmas.   I will schedule MRI of the lumbar spine to rule out HNP post the compression fracture old. They have opening later this afternoon and she will take it.  I will give Toradol for  pain.    Electronically Signed Sanjuana Kava, MD 12/12/201811:18 AM

## 2017-02-05 NOTE — Patient Instructions (Signed)
Your MRI has been ordered.  We will contact your insurance company for approval. [facility] [address] [place], [place].  Their scheduling number is [phone].  They will call you to schedule the appointment after the study has been given an authorization number.  If you have not been given an appointment within within 5 business days please call [phone] and ask for the pre-authorization representative in our office.  

## 2017-02-06 ENCOUNTER — Encounter: Payer: Self-pay | Admitting: Orthopaedic Surgery

## 2017-02-06 ENCOUNTER — Ambulatory Visit: Payer: Medicare Other | Admitting: Orthopaedic Surgery

## 2017-02-06 VITALS — BP 148/78 | HR 92 | Ht 63.0 in | Wt 115.0 lb

## 2017-02-06 DIAGNOSIS — M8448XA Pathological fracture, other site, initial encounter for fracture: Secondary | ICD-10-CM | POA: Diagnosis not present

## 2017-02-06 DIAGNOSIS — I10 Essential (primary) hypertension: Secondary | ICD-10-CM | POA: Diagnosis not present

## 2017-02-06 DIAGNOSIS — I6522 Occlusion and stenosis of left carotid artery: Secondary | ICD-10-CM | POA: Diagnosis not present

## 2017-02-06 DIAGNOSIS — J329 Chronic sinusitis, unspecified: Secondary | ICD-10-CM | POA: Diagnosis not present

## 2017-02-06 DIAGNOSIS — J9801 Acute bronchospasm: Secondary | ICD-10-CM | POA: Diagnosis not present

## 2017-02-06 DIAGNOSIS — E063 Autoimmune thyroiditis: Secondary | ICD-10-CM | POA: Diagnosis not present

## 2017-02-06 NOTE — Progress Notes (Signed)
Patient PI:RJJOAC Belinda Clark, female DOB:Jun 03, 1940, 76 y.o. [id number]  Chief Complaint  Patient presents with  . Results    MRI Lumbar    HPI  Belinda Clark is a 76 y.o. female who has had increasing pain of the lower back.  We were able to get a MRI yesterday afternoon at [facility].  It shows ill defined marrow edema in the right sacrum most likely representing insufficiency fractures with sacroiliitis in the differential.  She has small right foraminal disc extrusion at L2-L3.  She has old L3 compression fracture.  She has bilateral renal cysts known to her.  I have explained about the sacral insufficiency fracture.  It will take about six to eight weeks to heal.  She needs to avoid heavy lifting or bending.  I will try to get a bone density in about six weeks.  She is scheduled for left carotid surgery next Wednesday.  This fracture is not a contra-indication for the procedure.  She will talk to her surgeon. She does not need a brace.  She could not tolerate the Toradol.  I have told her to use Tylenol and avoid Advil, Aleve and aspirin unless her surgeon says it is acceptable prior to surgery.  She understands. HPI  Body mass index is 20.37 kg/m.  ROS  Review of Systems  HENT: Negative for congestion.   Respiratory: Negative for shortness of breath.   Cardiovascular: Negative for chest pain, palpitations and leg swelling.  Endocrine: Positive for cold intolerance.  Musculoskeletal: Positive for arthralgias, gait problem and joint swelling.  Allergic/Immunologic: Positive for environmental allergies.  All other systems reviewed and are negative.   Past Medical History:  Diagnosis Date  . Arthritis   . Asthma   . Carotid artery stenosis    40-59 % -Left  . GERD (gastroesophageal reflux disease)   . Glaucoma   . High blood pressure   . HTN (hypertension)   . Hyperlipidemia   . Hypothyroidism     Past Surgical History:  Procedure Laterality Date  . BREAST  BIOPSY     x3  . COLONOSCOPY N/A 10/05/2013   Procedure: COLONOSCOPY;  Surgeon: Jamesetta So, MD;  Location: [facility];  Service: Gastroenterology;  Laterality: N/A;  . FRACTURE SURGERY     Spinal Fx 06/30/11  . RHINOPLASTY    . SPINE SURGERY  06/30/11  . THYROIDECTOMY    . TONSILLECTOMY      Family History  Problem Relation Age of Onset  . Diabetes Mother   . Hypertension Mother   . Hyperlipidemia Mother   . Heart attack Father   . Heart disease Father   . Hyperlipidemia Father   . Heart disease Unknown   . Diabetes Unknown   . Stroke Brother   . Heart disease Brother   . Heart attack Brother   . Hyperlipidemia Brother   . Stroke Maternal Grandmother     Social History Social History   Tobacco Use  . Smoking status: Former Smoker    Packs/day: 1.00    Years: 30.00    Pack years: 30.00    Types: Cigarettes    Last attempt to quit: 12/26/1993    Years since quitting: 23.1  . Smokeless tobacco: Never Used  Substance Use Topics  . Alcohol use: No  . Drug use: No    Allergies  Allergen Reactions  . Ciprofloxacin Hives  . Cleocin [Clindamycin Hcl] Hives  . Clindamycin Hcl Hives  . Iodinated Diagnostic Agents Hives  Iohexol  . Sulfa Antibiotics Hives  . Cefuroxime Axetil   . Clarithromycin   . Codeine   . Cymbalta [Duloxetine Hcl]   . Doxycycline   . Levofloxacin   . Metoclopramide Hcl   . Penicillins     Current Outpatient Medications  Medication Sig Dispense Refill  . alendronate (FOSAMAX) 70 MG tablet Take 70 mg by mouth once a week. Take with a full glass of water on an empty stomach.  Sundays    . amLODipine (NORVASC) 5 MG tablet Take 5 mg by mouth daily.      . Ascorbic Acid (VITAMIN C) 1000 MG tablet Take 1,000 mg by mouth daily.      Marland Kitchen aspirin 81 MG tablet Take 81 mg by mouth at bedtime.     . Atorvastatin Calcium (LIPITOR PO) Take 40 mg by mouth daily.     . B Complex-C (SUPER B COMPLEX PO) Take 1 tablet by mouth daily.     . calcium  carbonate (OS-CAL) 600 MG TABS Take 600 mg in the morning and 1,200 mg at night    . Cholecalciferol (D3-1000) 1000 units capsule Take 1,000 Units by mouth daily.    . diphenhydrAMINE (BENADRYL) 50 MG capsule Take 50 mg by mouth 1 hour prior to your procedure. 1 capsule 0  . enalapril (VASOTEC) 20 MG tablet Take 2 tablets (40 mg total) by mouth daily. 180 tablet 3  . Garlic 8182 MG CAPS Take 1,000 mg by mouth daily.     Marland Kitchen ibuprofen (ADVIL,MOTRIN) 100 MG tablet Take 100 mg by mouth every 6 (six) hours as needed for fever.    . levothyroxine (SYNTHROID, LEVOTHROID) 125 MCG tablet Take 100 mcg by mouth daily before breakfast.     . LORazepam (ATIVAN) 0.5 MG tablet Take 0.5 mg by mouth as needed for sleep.     . metoprolol succinate (TOPROL-XL) 25 MG 24 hr tablet Take 25 mg by mouth daily.    . montelukast (SINGULAIR) 10 MG tablet Take 10 mg by mouth daily.     . Multiple Vitamin (MULTIVITAMIN) capsule Take 1 capsule by mouth daily.     . Omega-3 Fatty Acids (OMEGA 3 PO) Take 300 mg by mouth daily.     Marland Kitchen omeprazole (PRILOSEC) 20 MG capsule Take 1 tablet by mouth Daily.    . predniSONE (DELTASONE) 50 MG tablet One tablet (50mg ) 13 hours prior to procedure; one tablet (50mg ) 7 hours prior to procedure and then one tablet (50 mg) one hour prior to procedure. 3 tablet 0  . PROAIR HFA 108 (90 BASE) MCG/ACT inhaler Inhale 2 puffs into the lungs every 4 (four) hours as needed for wheezing or shortness of breath.     . pyridOXINE (VITAMIN B-6) 100 MG tablet Take 100 mg by mouth daily.    . traMADol (ULTRAM) 50 MG tablet Take 1 tablet (50 mg total) by mouth every 6 (six) hours as needed. (Patient taking differently: Take 50 mg by mouth every 6 (six) hours as needed for moderate pain. ) 60 tablet 1  . vitamin E 400 UNIT capsule Take 400 Units by mouth daily.       No current facility-administered medications for this visit.      Physical Exam  Blood pressure (!) 148/78, pulse 92, height 5\' 3"  (1.6 m),  weight 115 lb (52.2 kg).  Constitutional: overall normal hygiene, normal nutrition, well developed, normal grooming, normal body habitus. Assistive device:none  Musculoskeletal: gait and station Limp none, muscle  tone and strength are normal, no tremors or atrophy is present.  .  Neurological: coordination overall normal.  Deep tendon reflex/nerve stretch intact.  Sensation normal.  Cranial nerves II-XII intact.   Skin:   Normal overall no scars, lesions, ulcers or rashes. No psoriasis.  Psychiatric: Alert and oriented x 3.  Recent memory intact, remote memory unclear.  Normal mood and affect. Well groomed.  Good eye contact.  Cardiovascular: overall no swelling, no varicosities, no edema bilaterally, normal temperatures of the legs and arms, no clubbing, cyanosis and good capillary refill.  Lymphatic: palpation is normal.  All other systems reviewed and are negative   Her back is tender more at the lower level around L4 to the sacrum. She has no swelling.  Gait is slow but good.  NV intact.    The patient has been educated about the nature of the problem(s) and counseled on treatment options.  The patient appeared to understand what I have discussed and is in agreement with it.  Encounter Diagnosis  Name Primary?  . Sacral insufficiency fracture, initial encounter Yes    PLAN Call if any problems.  Precautions discussed.  Continue current medications.   Return to clinic 1 month   Electronically Signed Sanjuana Kava, MD 12/13/20184:22 PM

## 2017-02-07 ENCOUNTER — Telehealth: Payer: Self-pay

## 2017-02-07 DIAGNOSIS — M5417 Radiculopathy, lumbosacral region: Secondary | ICD-10-CM | POA: Diagnosis not present

## 2017-02-07 DIAGNOSIS — J449 Chronic obstructive pulmonary disease, unspecified: Secondary | ICD-10-CM | POA: Diagnosis not present

## 2017-02-07 DIAGNOSIS — I10 Essential (primary) hypertension: Secondary | ICD-10-CM | POA: Diagnosis not present

## 2017-02-07 NOTE — Telephone Encounter (Signed)
Ms. Kiger called because she is scheduled for surgery next week and was dx with sinusitis and bronchitis and wanted to know what she should do. She was instructed to keep her pre-op appointment for Tuesday 12/18 to see if she will be able to have the surgery.

## 2017-02-10 ENCOUNTER — Telehealth: Payer: Self-pay | Admitting: Vascular Surgery

## 2017-02-10 DIAGNOSIS — I25110 Atherosclerotic heart disease of native coronary artery with unstable angina pectoris: Secondary | ICD-10-CM | POA: Diagnosis not present

## 2017-02-10 DIAGNOSIS — Z01810 Encounter for preprocedural cardiovascular examination: Secondary | ICD-10-CM | POA: Diagnosis not present

## 2017-02-10 NOTE — Telephone Encounter (Signed)
-----   Message from Reola Calkins, [place] sent at 02/10/2017 11:56 AM EST ----- Would you please add Mrs. Strubel to TFE schedule. I have held a 10:30 slot for her.  Re: to discuss recent back fracture and concerns with proceeding with surgery

## 2017-02-11 ENCOUNTER — Encounter: Payer: Self-pay | Admitting: Vascular Surgery

## 2017-02-11 ENCOUNTER — Ambulatory Visit: Payer: Medicare Other | Admitting: Vascular Surgery

## 2017-02-11 ENCOUNTER — Telehealth: Payer: Self-pay | Admitting: *Deleted

## 2017-02-11 ENCOUNTER — Inpatient Hospital Stay (HOSPITAL_COMMUNITY)
Admission: RE | Admit: 2017-02-11 | Discharge: 2017-02-11 | Disposition: A | Discharge status: Home / Self Care | Payer: Medicare Other | Source: Ambulatory Visit

## 2017-02-11 VITALS — BP 131/67 | HR 91 | Temp 99.2°F | Resp 18 | Ht 63.0 in | Wt 115.0 lb

## 2017-02-11 DIAGNOSIS — I6522 Occlusion and stenosis of left carotid artery: Secondary | ICD-10-CM

## 2017-02-11 NOTE — Pre-Procedure Instructions (Signed)
Belinda Clark  02/11/2017      [place], [address] [place] [address] Phone: [phone] Fax: [phone]    Your procedure is scheduled on February 12, 2017.  Report to [facility] Admitting at 630 AM.  Call this number if you have problems the morning of surgery:  [phone]   Remember:  Do not eat food or drink liquids after midnight.  Take these medicines the morning of surgery with A SIP OF WATER metoprolol succinate (toprol-XL), amlodipine (norvasc), diphenhyramine (benedryl), levothyroxine (synthroid), lorazepam (ativan), montelukast (singulair), omepraole (prilosec), tramadol (ultram)-if needed.  7 days prior to surgery STOP taking any Aspirin (unless otherwise instructed by your surgeon), Aleve, Naproxen, Ibuprofen, Motrin, Advil, Goody's, BC's, all herbal medications, fish oil, and all vitamins  Continue all other medications as instructed by your physician except follow the above medication instructions before surgery   Do not wear jewelry, make-up or nail polish.  Do not wear lotions, powders, or perfumes, or deodorant.  Do not shave 48 hours prior to surgery.    Do not bring valuables to the hospital.  [facility] is not responsible for any belongings or valuables.  Contacts, dentures or bridgework may not be worn into surgery.  Leave your suitcase in the car.  After surgery it may be brought to your room.  For patients admitted to the hospital, discharge time will be determined by your treatment team.  Patients discharged the day of surgery will not be allowed to drive home.   Special instructions:   [facility]- Preparing For Surgery  Before surgery, you can play an important role. Because skin is not sterile, your skin needs to be as free of germs as possible. You can reduce the number of germs on your skin by washing with CHG (chlorahexidine gluconate) Soap before surgery.  CHG is an  antiseptic cleaner which kills germs and bonds with the skin to continue killing germs even after washing.  Please do not use if you have an allergy to CHG or antibacterial soaps. If your skin becomes reddened/irritated stop using the CHG.  Do not shave (including legs and underarms) for at least 48 hours prior to first CHG shower. It is OK to shave your face.  Please follow these instructions carefully.   1. Shower the NIGHT BEFORE SURGERY and the MORNING OF SURGERY with CHG.   2. If you chose to wash your hair, wash your hair first as usual with your normal shampoo.  3. After you shampoo, rinse your hair and body thoroughly to remove the shampoo.  4. Use CHG as you would any other liquid soap. You can apply CHG directly to the skin and wash gently with a scrungie or a clean washcloth.   5. Apply the CHG Soap to your body ONLY FROM THE NECK DOWN.  Do not use on open wounds or open sores. Avoid contact with your eyes, ears, mouth and genitals (private parts). Wash Face and genitals (private parts)  with your normal soap.  6. Wash thoroughly, paying special attention to the area where your surgery will be performed.  7. Thoroughly rinse your body with warm water from the neck down.  8. DO NOT shower/wash with your normal soap after using and rinsing off the CHG Soap.  9. Pat yourself dry with a CLEAN TOWEL.  10. Wear CLEAN PAJAMAS to bed the night before surgery, wear comfortable clothes the morning of  surgery  11. Place CLEAN SHEETS on your bed the night of your first shower and DO NOT SLEEP WITH PETS.   Day of Surgery: Do not apply any deodorants/lotions. Please wear clean clothes to the hospital/surgery center.     Please read over the following fact sheets that you were given.

## 2017-02-11 NOTE — Progress Notes (Signed)
[facility] [place]  Patient name: Belinda Clark MRN: [id number] DOB: July 29, 1940 Sex: female  REASON FOR VISIT: Discuss the carotid surgery in the presence of new severe back and leg pain  HPI: Belinda Clark is a 76 y.o. female was scheduled for elective left carotid endarterectomy tomorrow.  Since my last visit with her 2 weeks ago she worsening of her back difficulty.  She does have a history of chronic back pain and presented now with severe pain in her back extending down into her right leg.  She had an MR of her spine on 02/05/2017 and this showed small right foraminal disc extrusion at the L2-3 level.  Strongly uncomfortable and tearful.  She was unable to do her stress test for preoperative clearance due to her severe pain.  Past Medical History:  Diagnosis Date  . Arthritis   . Asthma   . Carotid artery stenosis    40-59 % -Left  . GERD (gastroesophageal reflux disease)   . Glaucoma   . High blood pressure   . HTN (hypertension)   . Hyperlipidemia   . Hypothyroidism     Family History  Problem Relation Age of Onset  . Diabetes Mother   . Hypertension Mother   . Hyperlipidemia Mother   . Heart attack Father   . Heart disease Father   . Hyperlipidemia Father   . Heart disease Unknown   . Diabetes Unknown   . Stroke Brother   . Heart disease Brother   . Heart attack Brother   . Hyperlipidemia Brother   . Stroke Maternal Grandmother     SOCIAL HISTORY: Social History   Tobacco Use  . Smoking status: Former Smoker    Packs/day: 1.00    Years: 30.00    Pack years: 30.00    Types: Cigarettes    Last attempt to quit: 12/26/1993    Years since quitting: 23.1  . Smokeless tobacco: Never Used  Substance Use Topics  . Alcohol use: No    Allergies  Allergen Reactions  . Ciprofloxacin Hives  . Cleocin [Clindamycin Hcl] Hives  . Clindamycin Hcl Hives  . Iodinated Diagnostic Agents Hives    Iohexol    . Sulfa Antibiotics Hives  . Cefuroxime Axetil   . Clarithromycin   . Codeine   . Cymbalta [Duloxetine Hcl]   . Doxycycline   . Levofloxacin   . Metoclopramide Hcl   . Penicillins     Current Outpatient Medications  Medication Sig Dispense Refill  . alendronate (FOSAMAX) 70 MG tablet Take 70 mg by mouth once a week. Take with a full glass of water on an empty stomach.  Sundays    . amLODipine (NORVASC) 5 MG tablet Take 5 mg by mouth daily.      . Ascorbic Acid (VITAMIN C) 1000 MG tablet Take 1,000 mg by mouth daily.      Marland Kitchen aspirin 81 MG tablet Take 81 mg by mouth at bedtime.     . Atorvastatin Calcium (LIPITOR PO) Take 40 mg by mouth daily.     . B Complex-C (SUPER B COMPLEX PO) Take 1 tablet by mouth daily.     . calcium carbonate (OS-CAL) 600 MG TABS Take 600 mg in the morning and 1,200 mg at night    . Cholecalciferol (D3-1000) 1000 units capsule Take 1,000 Units by mouth daily.    . diphenhydrAMINE (BENADRYL) 50 MG capsule Take 50 mg by mouth 1 hour prior to your  procedure. 1 capsule 0  . enalapril (VASOTEC) 20 MG tablet Take 2 tablets (40 mg total) by mouth daily. 180 tablet 3  . Garlic 0093 MG CAPS Take 1,000 mg by mouth daily.     Marland Kitchen ibuprofen (ADVIL,MOTRIN) 100 MG tablet Take 100 mg by mouth every 6 (six) hours as needed for fever.    . levothyroxine (SYNTHROID, LEVOTHROID) 125 MCG tablet Take 100 mcg by mouth daily before breakfast.     . LORazepam (ATIVAN) 0.5 MG tablet Take 0.5 mg by mouth as needed for sleep.     . metoprolol succinate (TOPROL-XL) 25 MG 24 hr tablet Take 25 mg by mouth daily.    . montelukast (SINGULAIR) 10 MG tablet Take 10 mg by mouth daily.     . Multiple Vitamin (MULTIVITAMIN) capsule Take 1 capsule by mouth daily.     . Omega-3 Fatty Acids (OMEGA 3 PO) Take 300 mg by mouth daily.     Marland Kitchen omeprazole (PRILOSEC) 20 MG capsule Take 1 tablet by mouth Daily.    . predniSONE (DELTASONE) 50 MG tablet One tablet (50mg ) 13 hours prior to procedure; one tablet  (50mg ) 7 hours prior to procedure and then one tablet (50 mg) one hour prior to procedure. 3 tablet 0  . PROAIR HFA 108 (90 BASE) MCG/ACT inhaler Inhale 2 puffs into the lungs every 4 (four) hours as needed for wheezing or shortness of breath.     . pyridOXINE (VITAMIN B-6) 100 MG tablet Take 100 mg by mouth daily.    . traMADol (ULTRAM) 50 MG tablet Take 1 tablet (50 mg total) by mouth every 6 (six) hours as needed. (Patient taking differently: Take 50 mg by mouth every 6 (six) hours as needed for moderate pain. ) 60 tablet 1  . vitamin E 400 UNIT capsule Take 400 Units by mouth daily.       No current facility-administered medications for this visit.     REVIEW OF SYSTEMS:  [X]  denotes positive finding, [ ]  denotes negative finding Cardiac  Comments:  Chest pain or chest pressure:    Shortness of breath upon exertion:    Short of breath when lying flat:    Irregular heart rhythm:        Vascular    Pain in calf, thigh, or hip brought on by ambulation:    Pain in feet at night that wakes you up from your sleep:     Blood clot in your veins:    Leg swelling:           PHYSICAL EXAM: Vitals:   02/11/17 1050  BP: 131/67  Pulse: 91  Resp: 18  Temp: 99.2 F (37.3 C)  TempSrc: Oral  SpO2: 98%  Weight: 115 lb (52.2 kg)  Height: 5\' 3"  (1.6 m)    GENERAL: The patient is a well-nourished female, in no acute distress. The vital signs are documented above. CARDIOVASCULAR: No change with palpable radial pulses PULMONARY: There is good air exchange  MUSCULOSKELETAL: There are no major deformities or cyanosis. NEUROLOGIC: No focal weakness or paresthesias are detected. SKIN: There are no ulcers or rashes noted. PSYCHIATRIC: The patient has a normal affect.  DATA:  MRI of her spine report noted  MEDICAL ISSUES: I have recommended postponing her carotid surgery tomorrow in light of her discomfort.  She will see Dr. Glenna Fellows urgently to determine what is possible for improvement.   We will then reschedule surgery after this.  She is questioning whether she  can take Naprosyn and I explained that this should help as the anti-inflammatory nature of the drug.  We will reschedule surgery following resolution of this acute issue    Rosetta Posner, MD [facility] Tel [phone] Pager [phone]

## 2017-02-11 NOTE — Telephone Encounter (Signed)
Phone call to Dr. Dion Body office to please return cardiac clearance form to this office ASAP. Pending surgery.

## 2017-02-12 ENCOUNTER — Encounter (HOSPITAL_COMMUNITY): Admission: RE | Payer: Self-pay | Source: Ambulatory Visit

## 2017-02-12 ENCOUNTER — Inpatient Hospital Stay (HOSPITAL_COMMUNITY): Admission: RE | Admit: 2017-02-12 | Payer: Medicare Other | Source: Ambulatory Visit | Admitting: Vascular Surgery

## 2017-02-12 DIAGNOSIS — M5126 Other intervertebral disc displacement, lumbar region: Secondary | ICD-10-CM | POA: Diagnosis not present

## 2017-02-12 SURGERY — ENDARTERECTOMY, CAROTID
Anesthesia: General | Laterality: Left

## 2017-02-14 DIAGNOSIS — M549 Dorsalgia, unspecified: Secondary | ICD-10-CM | POA: Diagnosis not present

## 2017-02-14 DIAGNOSIS — M5489 Other dorsalgia: Secondary | ICD-10-CM | POA: Diagnosis not present

## 2017-02-14 DIAGNOSIS — J069 Acute upper respiratory infection, unspecified: Secondary | ICD-10-CM | POA: Diagnosis not present

## 2017-02-19 DIAGNOSIS — I10 Essential (primary) hypertension: Secondary | ICD-10-CM | POA: Diagnosis not present

## 2017-02-19 DIAGNOSIS — I6522 Occlusion and stenosis of left carotid artery: Secondary | ICD-10-CM | POA: Diagnosis not present

## 2017-02-19 DIAGNOSIS — J189 Pneumonia, unspecified organism: Secondary | ICD-10-CM | POA: Diagnosis not present

## 2017-02-19 DIAGNOSIS — M5417 Radiculopathy, lumbosacral region: Secondary | ICD-10-CM | POA: Diagnosis not present

## 2017-02-20 ENCOUNTER — Other Ambulatory Visit: Payer: Self-pay | Admitting: Neurosurgery

## 2017-02-20 DIAGNOSIS — M5126 Other intervertebral disc displacement, lumbar region: Secondary | ICD-10-CM

## 2017-02-21 ENCOUNTER — Other Ambulatory Visit (HOSPITAL_COMMUNITY): Payer: Self-pay | Admitting: Internal Medicine

## 2017-02-21 DIAGNOSIS — E2839 Other primary ovarian failure: Secondary | ICD-10-CM

## 2017-02-27 DIAGNOSIS — I10 Essential (primary) hypertension: Secondary | ICD-10-CM | POA: Diagnosis not present

## 2017-02-27 DIAGNOSIS — I739 Peripheral vascular disease, unspecified: Secondary | ICD-10-CM | POA: Diagnosis not present

## 2017-02-27 DIAGNOSIS — G894 Chronic pain syndrome: Secondary | ICD-10-CM | POA: Diagnosis not present

## 2017-03-04 ENCOUNTER — Ambulatory Visit
Admission: RE | Admit: 2017-03-04 | Discharge: 2017-03-04 | Disposition: A | Discharge status: Home / Self Care | Payer: Medicare Other | Source: Ambulatory Visit | Attending: Neurosurgery | Admitting: Neurosurgery

## 2017-03-04 DIAGNOSIS — M47817 Spondylosis without myelopathy or radiculopathy, lumbosacral region: Secondary | ICD-10-CM | POA: Diagnosis not present

## 2017-03-04 DIAGNOSIS — M5126 Other intervertebral disc displacement, lumbar region: Secondary | ICD-10-CM

## 2017-03-04 MED ORDER — METHYLPREDNISOLONE ACETATE 40 MG/ML INJ SUSP (RADIOLOG
120.0000 mg | Freq: Once | INTRAMUSCULAR | Status: AC
  Administered 2017-03-04: 120 mg via EPIDURAL

## 2017-03-04 MED ORDER — IOPAMIDOL (ISOVUE-M 200) INJECTION 41%
1.0000 mL | Freq: Once | INTRAMUSCULAR | Status: AC
  Administered 2017-03-04: 1 mL via EPIDURAL

## 2017-03-04 NOTE — Discharge Instructions (Signed)

## 2017-03-06 ENCOUNTER — Ambulatory Visit: Payer: Medicare Other | Admitting: Orthopaedic Surgery

## 2017-03-12 DIAGNOSIS — M5126 Other intervertebral disc displacement, lumbar region: Secondary | ICD-10-CM | POA: Insufficient documentation

## 2017-03-13 ENCOUNTER — Other Ambulatory Visit: Payer: Self-pay | Admitting: Nurse Practitioner

## 2017-03-13 DIAGNOSIS — M5126 Other intervertebral disc displacement, lumbar region: Secondary | ICD-10-CM

## 2017-03-25 ENCOUNTER — Ambulatory Visit
Admission: RE | Admit: 2017-03-25 | Discharge: 2017-03-25 | Disposition: A | Discharge status: Home / Self Care | Payer: Medicare Other | Source: Ambulatory Visit | Attending: Nurse Practitioner | Admitting: Nurse Practitioner

## 2017-03-25 DIAGNOSIS — M5126 Other intervertebral disc displacement, lumbar region: Secondary | ICD-10-CM

## 2017-03-25 DIAGNOSIS — M47817 Spondylosis without myelopathy or radiculopathy, lumbosacral region: Secondary | ICD-10-CM | POA: Diagnosis not present

## 2017-03-25 MED ORDER — METHYLPREDNISOLONE ACETATE 40 MG/ML INJ SUSP (RADIOLOG
120.0000 mg | Freq: Once | INTRAMUSCULAR | Status: AC
  Administered 2017-03-25: 120 mg via EPIDURAL

## 2017-03-25 MED ORDER — IOPAMIDOL (ISOVUE-M 200) INJECTION 41%
1.0000 mL | Freq: Once | INTRAMUSCULAR | Status: AC
  Administered 2017-03-25: 1 mL via EPIDURAL

## 2017-03-25 NOTE — Discharge Instructions (Signed)

## 2017-04-07 DIAGNOSIS — M5126 Other intervertebral disc displacement, lumbar region: Secondary | ICD-10-CM | POA: Diagnosis not present

## 2017-04-22 DIAGNOSIS — I25110 Atherosclerotic heart disease of native coronary artery with unstable angina pectoris: Secondary | ICD-10-CM | POA: Diagnosis not present

## 2017-04-22 DIAGNOSIS — I2510 Atherosclerotic heart disease of native coronary artery without angina pectoris: Secondary | ICD-10-CM | POA: Diagnosis not present

## 2017-04-29 DIAGNOSIS — M5126 Other intervertebral disc displacement, lumbar region: Secondary | ICD-10-CM | POA: Diagnosis not present

## 2017-05-01 DIAGNOSIS — I10 Essential (primary) hypertension: Secondary | ICD-10-CM | POA: Diagnosis not present

## 2017-05-01 DIAGNOSIS — M546 Pain in thoracic spine: Secondary | ICD-10-CM | POA: Diagnosis not present

## 2017-05-01 DIAGNOSIS — I6529 Occlusion and stenosis of unspecified carotid artery: Secondary | ICD-10-CM | POA: Diagnosis not present

## 2017-05-01 DIAGNOSIS — Z1389 Encounter for screening for other disorder: Secondary | ICD-10-CM | POA: Diagnosis not present

## 2017-05-01 DIAGNOSIS — J449 Chronic obstructive pulmonary disease, unspecified: Secondary | ICD-10-CM | POA: Diagnosis not present

## 2017-05-05 ENCOUNTER — Other Ambulatory Visit: Payer: Self-pay | Admitting: Nurse Practitioner

## 2017-05-05 DIAGNOSIS — M545 Low back pain: Principal | ICD-10-CM

## 2017-05-05 DIAGNOSIS — G8929 Other chronic pain: Secondary | ICD-10-CM

## 2017-05-06 ENCOUNTER — Other Ambulatory Visit: Payer: Self-pay

## 2017-05-06 ENCOUNTER — Ambulatory Visit (INDEPENDENT_AMBULATORY_CARE_PROVIDER_SITE_OTHER): Payer: Medicare Other | Admitting: Vascular Surgery

## 2017-05-06 ENCOUNTER — Encounter: Payer: Self-pay | Admitting: *Deleted

## 2017-05-06 ENCOUNTER — Encounter: Payer: Self-pay | Admitting: Vascular Surgery

## 2017-05-06 ENCOUNTER — Other Ambulatory Visit: Payer: Self-pay | Admitting: *Deleted

## 2017-05-06 VITALS — BP 150/79 | HR 91 | Temp 99.1°F | Resp 20 | Ht 63.0 in | Wt 117.6 lb

## 2017-05-06 DIAGNOSIS — I6522 Occlusion and stenosis of left carotid artery: Secondary | ICD-10-CM

## 2017-05-06 NOTE — H&P (View-Only) (Signed)
[facility] of [place]  Patient name: Belinda Clark MRN: [id number] DOB: Jun 29, 1940 Sex: female  REASON FOR VISIT: Discuss plan for asymptomatic left carotid endarterectomy  HPI: Belinda Clark is a 77 y.o. female here today for his continued discussion.  She had been scheduled for left carotid endarterectomy but had markedly worsening back pain and had to cancel.  She has seen Dr. Glenna Fellows and has had 2 separate spine injections and has another spine injection planned for this Thursday, March 15.  She continued to have difficulty with her spine and may need to have spine surgery but cannot have this done until she has carotid endarterectomy.  Past Medical History:  Diagnosis Date  . Arthritis   . Asthma   . Carotid artery stenosis    40-59 % -Left  . GERD (gastroesophageal reflux disease)   . Glaucoma   . High blood pressure   . HTN (hypertension)   . Hyperlipidemia   . Hypothyroidism     Family History  Problem Relation Age of Onset  . Diabetes Mother   . Hypertension Mother   . Hyperlipidemia Mother   . Heart attack Father   . Heart disease Father   . Hyperlipidemia Father   . Heart disease Unknown   . Diabetes Unknown   . Stroke Brother   . Heart disease Brother   . Heart attack Brother   . Hyperlipidemia Brother   . Stroke Maternal Grandmother     SOCIAL HISTORY: Social History   Tobacco Use  . Smoking status: Former Smoker    Packs/day: 1.00    Years: 30.00    Pack years: 30.00    Types: Cigarettes    Last attempt to quit: 12/26/1993    Years since quitting: 23.3  . Smokeless tobacco: Never Used  Substance Use Topics  . Alcohol use: No    Allergies  Allergen Reactions  . Ciprofloxacin Hives  . Cleocin [Clindamycin Hcl] Hives  . Clindamycin Hcl Hives  . Iodinated Diagnostic Agents Hives    Iohexol  . Sulfa Antibiotics Hives  . Cefuroxime Axetil   . Clarithromycin   . Codeine   .  Cymbalta [Duloxetine Hcl]   . Levofloxacin   . Metoclopramide Hcl   . Penicillins     Current Outpatient Medications  Medication Sig Dispense Refill  . alendronate (FOSAMAX) 70 MG tablet Take 70 mg by mouth once a week. Take with a full glass of water on an empty stomach.  Sundays    . amLODipine (NORVASC) 5 MG tablet Take 5 mg by mouth daily.      . Ascorbic Acid (VITAMIN C) 1000 MG tablet Take 1,000 mg by mouth daily.      Marland Kitchen aspirin 81 MG tablet Take 81 mg by mouth at bedtime.     . Atorvastatin Calcium (LIPITOR PO) Take 40 mg by mouth daily.     . B Complex-C (SUPER B COMPLEX PO) Take 1 tablet by mouth daily.     . calcium carbonate (OS-CAL) 600 MG TABS Take 600 mg in the morning and 1,200 mg at night    . Cholecalciferol (D3-1000) 1000 units capsule Take 1,000 Units by mouth daily.    . diphenhydrAMINE (BENADRYL) 50 MG capsule Take 50 mg by mouth 1 hour prior to your procedure. 1 capsule 0  . enalapril (VASOTEC) 20 MG tablet Take 2 tablets (40 mg total) by mouth daily. 180 tablet 3  . Garlic 7591 MG CAPS  Take 1,000 mg by mouth daily.     Marland Kitchen HYDROmorphone HCl (DILAUDID-5 PO) Take by mouth.    Marland Kitchen ibuprofen (ADVIL,MOTRIN) 100 MG tablet Take 100 mg by mouth every 6 (six) hours as needed for fever.    . levothyroxine (SYNTHROID, LEVOTHROID) 125 MCG tablet Take 100 mcg by mouth daily before breakfast.     . LORazepam (ATIVAN) 0.5 MG tablet Take 0.5 mg by mouth as needed for sleep.     . metoprolol succinate (TOPROL-XL) 25 MG 24 hr tablet Take 25 mg by mouth daily.    . montelukast (SINGULAIR) 10 MG tablet Take 10 mg by mouth daily.     . Multiple Vitamin (MULTIVITAMIN) capsule Take 1 capsule by mouth daily.     . Omega-3 Fatty Acids (OMEGA 3 PO) Take 300 mg by mouth daily.     Marland Kitchen omeprazole (PRILOSEC) 20 MG capsule Take 1 tablet by mouth Daily.    . predniSONE (DELTASONE) 50 MG tablet One tablet (50mg ) 13 hours prior to procedure; one tablet (50mg ) 7 hours prior to procedure and then one  tablet (50 mg) one hour prior to procedure. 3 tablet 0  . PROAIR HFA 108 (90 BASE) MCG/ACT inhaler Inhale 2 puffs into the lungs every 4 (four) hours as needed for wheezing or shortness of breath.     . pyridOXINE (VITAMIN B-6) 100 MG tablet Take 100 mg by mouth daily.    . traMADol (ULTRAM) 50 MG tablet Take 1 tablet (50 mg total) by mouth every 6 (six) hours as needed. (Patient taking differently: Take 50 mg by mouth every 6 (six) hours as needed for moderate pain. ) 60 tablet 1  . vitamin E 400 UNIT capsule Take 400 Units by mouth daily.       No current facility-administered medications for this visit.     REVIEW OF SYSTEMS:  [X]  denotes positive finding, [ ]  denotes negative finding Cardiac  Comments:  Chest pain or chest pressure:    Shortness of breath upon exertion:    Short of breath when lying flat:    Irregular heart rhythm:        Vascular    Pain in calf, thigh, or hip brought on by ambulation:    Pain in feet at night that wakes you up from your sleep:     Blood clot in your veins:    Leg swelling:           PHYSICAL EXAM: Vitals:   05/06/17 1518 05/06/17 1522  BP: 133/65 (!) 150/79  Pulse: 91   Resp: 20   Temp: 99.1 F (37.3 C)   TempSrc: Oral   SpO2: 96%   Weight: 117 lb 9.6 oz (53.3 kg)   Height: 5\' 3"  (1.6 m)     GENERAL: The patient is a well-nourished female, in no acute distress. The vital signs are documented above. CARDIOVASCULAR: Palpable radial pulses bilaterally PULMONARY: There is good air exchange  MUSCULOSKELETAL: There are no major deformities or cyanosis. NEUROLOGIC: No focal weakness or paresthesias are detected. SKIN: There are no ulcers or rashes noted. PSYCHIATRIC: The patient has a normal affect.  DATA:  No new data.  CT angiogram showing severe left internal carotid artery stenosis  MEDICAL ISSUES: Again had a long discussion with the patient.  She is quite upset and tearful regarding her severe pain.  She record reports that  Dilaudid is the only thing that is giving her relief and she is quite concerned about becoming dependent  upon this.  I explained that her carotid surgery is not urgent since she is asymptomatic but it sounds as though she can only have definitive treatment with her back after endarterectomy to reduce stroke risk.  We will coordinate left carotid endarterectomy at her earliest convenience and are available next week if she would like to proceed.    Rosetta Posner, MD FACS [facility] Specialists of [facility] Tel [phone] Pager [phone]

## 2017-05-06 NOTE — Progress Notes (Signed)
Vascular and Vein Specialist of [place]  Patient name: Belinda Clark MRN: [id number] DOB: 1940/08/03 Sex: female  REASON FOR VISIT: Discuss plan for asymptomatic left carotid endarterectomy  HPI: Belinda Clark is a 77 y.o. female here today for his continued discussion.  She had been scheduled for left carotid endarterectomy but had markedly worsening back pain and had to cancel.  She has seen Dr. Glenna Fellows and has had 2 separate spine injections and has another spine injection planned for this Thursday, March 15.  She continued to have difficulty with her spine and may need to have spine surgery but cannot have this done until she has carotid endarterectomy.  Past Medical History:  Diagnosis Date  . Arthritis   . Asthma   . Carotid artery stenosis    40-59 % -Left  . GERD (gastroesophageal reflux disease)   . Glaucoma   . High blood pressure   . HTN (hypertension)   . Hyperlipidemia   . Hypothyroidism     Family History  Problem Relation Age of Onset  . Diabetes Mother   . Hypertension Mother   . Hyperlipidemia Mother   . Heart attack Father   . Heart disease Father   . Hyperlipidemia Father   . Heart disease Unknown   . Diabetes Unknown   . Stroke Brother   . Heart disease Brother   . Heart attack Brother   . Hyperlipidemia Brother   . Stroke Maternal Grandmother     SOCIAL HISTORY: Social History   Tobacco Use  . Smoking status: Former Smoker    Packs/day: 1.00    Years: 30.00    Pack years: 30.00    Types: Cigarettes    Last attempt to quit: 12/26/1993    Years since quitting: 23.3  . Smokeless tobacco: Never Used  Substance Use Topics  . Alcohol use: No    Allergies  Allergen Reactions  . Ciprofloxacin Hives  . Cleocin [Clindamycin Hcl] Hives  . Clindamycin Hcl Hives  . Iodinated Diagnostic Agents Hives    Iohexol  . Sulfa Antibiotics Hives  . Cefuroxime Axetil   . Clarithromycin   . Codeine   .  Cymbalta [Duloxetine Hcl]   . Levofloxacin   . Metoclopramide Hcl   . Penicillins     Current Outpatient Medications  Medication Sig Dispense Refill  . alendronate (FOSAMAX) 70 MG tablet Take 70 mg by mouth once a week. Take with a full glass of water on an empty stomach.  Sundays    . amLODipine (NORVASC) 5 MG tablet Take 5 mg by mouth daily.      . Ascorbic Acid (VITAMIN C) 1000 MG tablet Take 1,000 mg by mouth daily.      Marland Kitchen aspirin 81 MG tablet Take 81 mg by mouth at bedtime.     . Atorvastatin Calcium (LIPITOR PO) Take 40 mg by mouth daily.     . B Complex-C (SUPER B COMPLEX PO) Take 1 tablet by mouth daily.     . calcium carbonate (OS-CAL) 600 MG TABS Take 600 mg in the morning and 1,200 mg at night    . Cholecalciferol (D3-1000) 1000 units capsule Take 1,000 Units by mouth daily.    . diphenhydrAMINE (BENADRYL) 50 MG capsule Take 50 mg by mouth 1 hour prior to your procedure. 1 capsule 0  . enalapril (VASOTEC) 20 MG tablet Take 2 tablets (40 mg total) by mouth daily. 180 tablet 3  . Garlic 7654 MG CAPS  Take 1,000 mg by mouth daily.     Marland Kitchen HYDROmorphone HCl (DILAUDID-5 PO) Take by mouth.    Marland Kitchen ibuprofen (ADVIL,MOTRIN) 100 MG tablet Take 100 mg by mouth every 6 (six) hours as needed for fever.    . levothyroxine (SYNTHROID, LEVOTHROID) 125 MCG tablet Take 100 mcg by mouth daily before breakfast.     . LORazepam (ATIVAN) 0.5 MG tablet Take 0.5 mg by mouth as needed for sleep.     . metoprolol succinate (TOPROL-XL) 25 MG 24 hr tablet Take 25 mg by mouth daily.    . montelukast (SINGULAIR) 10 MG tablet Take 10 mg by mouth daily.     . Multiple Vitamin (MULTIVITAMIN) capsule Take 1 capsule by mouth daily.     . Omega-3 Fatty Acids (OMEGA 3 PO) Take 300 mg by mouth daily.     Marland Kitchen omeprazole (PRILOSEC) 20 MG capsule Take 1 tablet by mouth Daily.    . predniSONE (DELTASONE) 50 MG tablet One tablet (50mg ) 13 hours prior to procedure; one tablet (50mg ) 7 hours prior to procedure and then one  tablet (50 mg) one hour prior to procedure. 3 tablet 0  . PROAIR HFA 108 (90 BASE) MCG/ACT inhaler Inhale 2 puffs into the lungs every 4 (four) hours as needed for wheezing or shortness of breath.     . pyridOXINE (VITAMIN B-6) 100 MG tablet Take 100 mg by mouth daily.    . traMADol (ULTRAM) 50 MG tablet Take 1 tablet (50 mg total) by mouth every 6 (six) hours as needed. (Patient taking differently: Take 50 mg by mouth every 6 (six) hours as needed for moderate pain. ) 60 tablet 1  . vitamin E 400 UNIT capsule Take 400 Units by mouth daily.       No current facility-administered medications for this visit.     REVIEW OF SYSTEMS:  [X]  denotes positive finding, [ ]  denotes negative finding Cardiac  Comments:  Chest pain or chest pressure:    Shortness of breath upon exertion:    Short of breath when lying flat:    Irregular heart rhythm:        Vascular    Pain in calf, thigh, or hip brought on by ambulation:    Pain in feet at night that wakes you up from your sleep:     Blood clot in your veins:    Leg swelling:           PHYSICAL EXAM: Vitals:   05/06/17 1518 05/06/17 1522  BP: 133/65 (!) 150/79  Pulse: 91   Resp: 20   Temp: 99.1 F (37.3 C)   TempSrc: Oral   SpO2: 96%   Weight: 117 lb 9.6 oz (53.3 kg)   Height: 5\' 3"  (1.6 m)     GENERAL: The patient is a well-nourished female, in no acute distress. The vital signs are documented above. CARDIOVASCULAR: Palpable radial pulses bilaterally PULMONARY: There is good air exchange  MUSCULOSKELETAL: There are no major deformities or cyanosis. NEUROLOGIC: No focal weakness or paresthesias are detected. SKIN: There are no ulcers or rashes noted. PSYCHIATRIC: The patient has a normal affect.  DATA:  No new data.  CT angiogram showing severe left internal carotid artery stenosis  MEDICAL ISSUES: Again had a long discussion with the patient.  She is quite upset and tearful regarding her severe pain.  She record reports that  Dilaudid is the only thing that is giving her relief and she is quite concerned about becoming dependent  upon this.  I explained that her carotid surgery is not urgent since she is asymptomatic but it sounds as though she can only have definitive treatment with her back after endarterectomy to reduce stroke risk.  We will coordinate left carotid endarterectomy at her earliest convenience and are available next week if she would like to proceed.    Rosetta Posner, MD FACS [facility] Ve[facility] Specialists of [facility] Tel [phone] Pager [phone]

## 2017-05-09 ENCOUNTER — Ambulatory Visit
Admission: RE | Admit: 2017-05-09 | Discharge: 2017-05-09 | Disposition: A | Discharge status: Home / Self Care | Payer: Medicare Other | Source: Ambulatory Visit | Attending: Nurse Practitioner | Admitting: Nurse Practitioner

## 2017-05-09 DIAGNOSIS — G8929 Other chronic pain: Secondary | ICD-10-CM

## 2017-05-09 DIAGNOSIS — M5416 Radiculopathy, lumbar region: Secondary | ICD-10-CM | POA: Diagnosis not present

## 2017-05-09 DIAGNOSIS — M545 Low back pain: Principal | ICD-10-CM

## 2017-05-09 MED ORDER — IOPAMIDOL (ISOVUE-M 200) INJECTION 41%
1.0000 mL | Freq: Once | INTRAMUSCULAR | Status: AC
  Administered 2017-05-09: 1 mL via EPIDURAL

## 2017-05-09 MED ORDER — METHYLPREDNISOLONE ACETATE 40 MG/ML INJ SUSP (RADIOLOG
120.0000 mg | Freq: Once | INTRAMUSCULAR | Status: AC
  Administered 2017-05-09: 120 mg via EPIDURAL

## 2017-05-14 DIAGNOSIS — I6522 Occlusion and stenosis of left carotid artery: Secondary | ICD-10-CM | POA: Diagnosis not present

## 2017-05-14 DIAGNOSIS — M546 Pain in thoracic spine: Secondary | ICD-10-CM | POA: Diagnosis not present

## 2017-05-15 NOTE — Pre-Procedure Instructions (Signed)
Belinda Clark  05/15/2017      [place], [address] [place] [address] Phone: [phone] Fax: [phone]    Your procedure is scheduled on  Monday  05/19/17  Report to [facility] Admitting at 730 A.M.  Call this number if you have problems the morning of surgery:  [phone]   Remember:  Do not eat food or drink liquids after midnight.  Take these medicines the morning of surgery with A SIP OF WATER -AMLODIPINE (NORVASC), HYDROMORPHONE IF NEEDED, LEVOTHYROXINE, METOPROLOL(TOPROL), SINGULAIR, OMEPRAZOLE (PRILOSEC), PREDNISONE, PROAIR INHALER IF NEEDED  7 days prior to surgery STOP taking(unless otherwise instructed by your surgeon), Aleve, Naproxen, Ibuprofen, Motrin, Advil, Goody's, BC's, all herbal medications, fish oil, and all vitamins    Do not wear jewelry, make-up or nail polish.  Do not wear lotions, powders, or perfumes, or deodorant.  Do not shave 48 hours prior to surgery.  Men may shave face and neck.  Do not bring valuables to the hospital.  [facility] is not responsible for any belongings or valuables.  Contacts, dentures or bridgework may not be worn into surgery.  Leave your suitcase in the car.  After surgery it may be brought to your room.  For patients admitted to the hospital, discharge time will be determined by your treatment team.  Patients discharged the day of surgery will not be allowed to drive home.   Name and phone number of your driver:    Special instructions:  [facility] - Preparing for Surgery  Before surgery, you can play an important role.  Because skin is not sterile, your skin needs to be as free of germs as possible.  You can reduce the number of germs on you skin by washing with CHG (chlorahexidine gluconate) soap before surgery.  CHG is an antiseptic cleaner which kills germs and bonds with the skin to continue killing germs even after washing.  Please DO NOT use  if you have an allergy to CHG or antibacterial soaps.  If your skin becomes reddened/irritated stop using the CHG and inform your nurse when you arrive at Short Stay.  Do not shave (including legs and underarms) for at least 48 hours prior to the first CHG shower.  You may shave your face.  Please follow these instructions carefully:   1.  Shower with CHG Soap the night before surgery and the                                morning of Surgery.  2.  If you choose to wash your hair, wash your hair first as usual with your       normal shampoo.  3.  After you shampoo, rinse your hair and body thoroughly to remove the                      Shampoo.  4.  Use CHG as you would any other liquid soap.  You can apply chg directly       to the skin and wash gently with scrungie or a clean washcloth.  5.  Apply the CHG Soap to your body ONLY FROM THE NECK DOWN.        Do not use on open wounds or open sores.  Avoid contact with your eyes,       ears, mouth  and genitals (private parts).  Wash genitals (private parts)       with your normal soap.  6.  Wash thoroughly, paying special attention to the area where your surgery        will be performed.  7.  Thoroughly rinse your body with warm water from the neck down.  8.  DO NOT shower/wash with your normal soap after using and rinsing off       the CHG Soap.  9.  Pat yourself dry with a clean towel.            10.  Wear clean pajamas.            11.  Place clean sheets on your bed the night of your first shower and do not        sleep with pets.  Day of Surgery  Do not apply any lotions/deoderants the morning of surgery.  Please wear clean clothes to the hospital/surgery center.     Please read over the following fact sheets that you were given. MRSA Information and Surgical Site Infection Prevention

## 2017-05-16 ENCOUNTER — Encounter (HOSPITAL_COMMUNITY)
Admission: RE | Admit: 2017-05-16 | Discharge: 2017-05-16 | Disposition: A | Discharge status: Home / Self Care | Payer: Medicare Other | Source: Ambulatory Visit | Attending: Vascular Surgery | Admitting: Vascular Surgery

## 2017-05-16 ENCOUNTER — Encounter (HOSPITAL_COMMUNITY): Payer: Self-pay

## 2017-05-16 ENCOUNTER — Other Ambulatory Visit: Payer: Self-pay

## 2017-05-16 DIAGNOSIS — Z833 Family history of diabetes mellitus: Secondary | ICD-10-CM | POA: Diagnosis not present

## 2017-05-16 DIAGNOSIS — M47896 Other spondylosis, lumbar region: Secondary | ICD-10-CM | POA: Diagnosis not present

## 2017-05-16 DIAGNOSIS — J45909 Unspecified asthma, uncomplicated: Secondary | ICD-10-CM | POA: Diagnosis not present

## 2017-05-16 DIAGNOSIS — Z87891 Personal history of nicotine dependence: Secondary | ICD-10-CM | POA: Diagnosis not present

## 2017-05-16 DIAGNOSIS — Z885 Allergy status to narcotic agent status: Secondary | ICD-10-CM | POA: Diagnosis not present

## 2017-05-16 DIAGNOSIS — Z888 Allergy status to other drugs, medicaments and biological substances status: Secondary | ICD-10-CM | POA: Diagnosis not present

## 2017-05-16 DIAGNOSIS — E039 Hypothyroidism, unspecified: Secondary | ICD-10-CM | POA: Diagnosis not present

## 2017-05-16 DIAGNOSIS — M4854XA Collapsed vertebra, not elsewhere classified, thoracic region, initial encounter for fracture: Secondary | ICD-10-CM | POA: Diagnosis not present

## 2017-05-16 DIAGNOSIS — I6522 Occlusion and stenosis of left carotid artery: Secondary | ICD-10-CM | POA: Diagnosis not present

## 2017-05-16 DIAGNOSIS — M47897 Other spondylosis, lumbosacral region: Secondary | ICD-10-CM | POA: Diagnosis not present

## 2017-05-16 DIAGNOSIS — Z91041 Radiographic dye allergy status: Secondary | ICD-10-CM | POA: Diagnosis not present

## 2017-05-16 DIAGNOSIS — Z79890 Hormone replacement therapy (postmenopausal): Secondary | ICD-10-CM | POA: Diagnosis not present

## 2017-05-16 DIAGNOSIS — M5136 Other intervertebral disc degeneration, lumbar region: Secondary | ICD-10-CM | POA: Diagnosis not present

## 2017-05-16 DIAGNOSIS — K219 Gastro-esophageal reflux disease without esophagitis: Secondary | ICD-10-CM | POA: Diagnosis not present

## 2017-05-16 DIAGNOSIS — Z7983 Long term (current) use of bisphosphonates: Secondary | ICD-10-CM | POA: Diagnosis not present

## 2017-05-16 DIAGNOSIS — Z8249 Family history of ischemic heart disease and other diseases of the circulatory system: Secondary | ICD-10-CM | POA: Diagnosis not present

## 2017-05-16 DIAGNOSIS — Z881 Allergy status to other antibiotic agents status: Secondary | ICD-10-CM | POA: Diagnosis not present

## 2017-05-16 DIAGNOSIS — Z7982 Long term (current) use of aspirin: Secondary | ICD-10-CM | POA: Diagnosis not present

## 2017-05-16 DIAGNOSIS — E785 Hyperlipidemia, unspecified: Secondary | ICD-10-CM | POA: Diagnosis not present

## 2017-05-16 DIAGNOSIS — Z880 Allergy status to penicillin: Secondary | ICD-10-CM | POA: Diagnosis not present

## 2017-05-16 DIAGNOSIS — M48061 Spinal stenosis, lumbar region without neurogenic claudication: Secondary | ICD-10-CM | POA: Diagnosis not present

## 2017-05-16 DIAGNOSIS — M1990 Unspecified osteoarthritis, unspecified site: Secondary | ICD-10-CM | POA: Diagnosis not present

## 2017-05-16 DIAGNOSIS — I10 Essential (primary) hypertension: Secondary | ICD-10-CM | POA: Diagnosis not present

## 2017-05-16 DIAGNOSIS — Z882 Allergy status to sulfonamides status: Secondary | ICD-10-CM | POA: Diagnosis not present

## 2017-05-16 DIAGNOSIS — Z8349 Family history of other endocrine, nutritional and metabolic diseases: Secondary | ICD-10-CM | POA: Diagnosis not present

## 2017-05-16 DIAGNOSIS — H409 Unspecified glaucoma: Secondary | ICD-10-CM | POA: Diagnosis not present

## 2017-05-16 DIAGNOSIS — Z823 Family history of stroke: Secondary | ICD-10-CM | POA: Diagnosis not present

## 2017-05-16 HISTORY — DX: Anxiety disorder, unspecified: F41.9

## 2017-05-16 HISTORY — DX: Other specified postprocedural states: Z98.890

## 2017-05-16 HISTORY — DX: Nausea with vomiting, unspecified: R11.2

## 2017-05-16 HISTORY — DX: Other specified postprocedural states: R11.2

## 2017-05-16 LAB — COMPREHENSIVE METABOLIC PANEL
ALK PHOS: 98 U/L (ref 38–126)
ALT: 16 U/L (ref 14–54)
AST: 24 U/L (ref 15–41)
Albumin: 4.1 g/dL (ref 3.5–5.0)
Anion gap: 8 (ref 5–15)
BILIRUBIN TOTAL: 0.4 mg/dL (ref 0.3–1.2)
BUN: 13 mg/dL (ref 6–20)
CHLORIDE: 103 mmol/L (ref 101–111)
CO2: 26 mmol/L (ref 22–32)
Calcium: 9.7 mg/dL (ref 8.9–10.3)
Creatinine, Ser: 0.56 mg/dL (ref 0.44–1.00)
GFR calc Af Amer: >60 mL/min (ref >60)
Glucose, Bld: 92 mg/dL (ref 65–99)
Potassium: 4.2 mmol/L (ref 3.5–5.1)
Sodium: 137 mmol/L (ref 135–145)
TOTAL PROTEIN: 7.4 g/dL (ref 6.5–8.1)

## 2017-05-16 LAB — URINALYSIS, ROUTINE W REFLEX MICROSCOPIC
Bilirubin Urine: NEGATIVE
GLUCOSE, UA: NEGATIVE mg/dL
Hgb urine dipstick: NEGATIVE
KETONES UR: NEGATIVE mg/dL
LEUKOCYTES UA: NEGATIVE
Nitrite: NEGATIVE
PROTEIN: NEGATIVE mg/dL
Specific Gravity, Urine: 1.005 (ref 1.005–1.030)
pH: 7 (ref 5.0–8.0)

## 2017-05-16 LAB — PROTIME-INR
INR: 0.91
PROTHROMBIN TIME: 12.2 s (ref 11.4–15.2)

## 2017-05-16 LAB — SURGICAL PCR SCREEN
MRSA, PCR: NEGATIVE
Staphylococcus aureus: NEGATIVE

## 2017-05-16 LAB — CBC
HEMATOCRIT: 43.8 % (ref 36.0–46.0)
HEMOGLOBIN: 14.4 g/dL (ref 12.0–15.0)
MCH: 31 pg (ref 26.0–34.0)
MCHC: 32.9 g/dL (ref 30.0–36.0)
MCV: 94.2 fL (ref 78.0–100.0)
Platelets: 249 10*3/uL (ref 150–400)
RBC: 4.65 MIL/uL (ref 3.87–5.11)
RDW: 13.6 % (ref 11.5–15.5)
WBC: 9.2 10*3/uL (ref 4.0–10.5)

## 2017-05-16 LAB — APTT: aPTT: 26 seconds (ref 24–36)

## 2017-05-19 ENCOUNTER — Other Ambulatory Visit: Payer: Self-pay

## 2017-05-19 ENCOUNTER — Encounter (HOSPITAL_COMMUNITY)
Admission: RE | Disposition: A | Discharge status: Home / Self Care | Payer: Self-pay | Source: Ambulatory Visit | Attending: Vascular Surgery

## 2017-05-19 ENCOUNTER — Inpatient Hospital Stay (HOSPITAL_COMMUNITY): Payer: Medicare Other

## 2017-05-19 ENCOUNTER — Encounter (HOSPITAL_COMMUNITY): Payer: Self-pay | Admitting: Surgery

## 2017-05-19 ENCOUNTER — Inpatient Hospital Stay (HOSPITAL_COMMUNITY)
Admission: RE | Admit: 2017-05-19 | Discharge: 2017-05-20 | DRG: 039 | Disposition: A | Discharge status: Home / Self Care | Payer: Medicare Other | Source: Ambulatory Visit | Attending: Vascular Surgery | Admitting: Vascular Surgery

## 2017-05-19 DIAGNOSIS — Z882 Allergy status to sulfonamides status: Secondary | ICD-10-CM

## 2017-05-19 DIAGNOSIS — K219 Gastro-esophageal reflux disease without esophagitis: Secondary | ICD-10-CM | POA: Diagnosis not present

## 2017-05-19 DIAGNOSIS — Z8349 Family history of other endocrine, nutritional and metabolic diseases: Secondary | ICD-10-CM | POA: Diagnosis not present

## 2017-05-19 DIAGNOSIS — Z87891 Personal history of nicotine dependence: Secondary | ICD-10-CM

## 2017-05-19 DIAGNOSIS — E039 Hypothyroidism, unspecified: Secondary | ICD-10-CM | POA: Diagnosis present

## 2017-05-19 DIAGNOSIS — Z7982 Long term (current) use of aspirin: Secondary | ICD-10-CM | POA: Diagnosis not present

## 2017-05-19 DIAGNOSIS — H409 Unspecified glaucoma: Secondary | ICD-10-CM | POA: Diagnosis present

## 2017-05-19 DIAGNOSIS — Z888 Allergy status to other drugs, medicaments and biological substances status: Secondary | ICD-10-CM

## 2017-05-19 DIAGNOSIS — Z91041 Radiographic dye allergy status: Secondary | ICD-10-CM | POA: Diagnosis not present

## 2017-05-19 DIAGNOSIS — Z881 Allergy status to other antibiotic agents status: Secondary | ICD-10-CM | POA: Diagnosis not present

## 2017-05-19 DIAGNOSIS — Z880 Allergy status to penicillin: Secondary | ICD-10-CM

## 2017-05-19 DIAGNOSIS — I6529 Occlusion and stenosis of unspecified carotid artery: Secondary | ICD-10-CM | POA: Diagnosis present

## 2017-05-19 DIAGNOSIS — Z833 Family history of diabetes mellitus: Secondary | ICD-10-CM | POA: Diagnosis not present

## 2017-05-19 DIAGNOSIS — E785 Hyperlipidemia, unspecified: Secondary | ICD-10-CM | POA: Diagnosis not present

## 2017-05-19 DIAGNOSIS — Z8249 Family history of ischemic heart disease and other diseases of the circulatory system: Secondary | ICD-10-CM

## 2017-05-19 DIAGNOSIS — Z79890 Hormone replacement therapy (postmenopausal): Secondary | ICD-10-CM | POA: Diagnosis not present

## 2017-05-19 DIAGNOSIS — M1990 Unspecified osteoarthritis, unspecified site: Secondary | ICD-10-CM | POA: Diagnosis present

## 2017-05-19 DIAGNOSIS — Z885 Allergy status to narcotic agent status: Secondary | ICD-10-CM | POA: Diagnosis not present

## 2017-05-19 DIAGNOSIS — I10 Essential (primary) hypertension: Secondary | ICD-10-CM | POA: Diagnosis not present

## 2017-05-19 DIAGNOSIS — Z7983 Long term (current) use of bisphosphonates: Secondary | ICD-10-CM | POA: Diagnosis not present

## 2017-05-19 DIAGNOSIS — J45909 Unspecified asthma, uncomplicated: Secondary | ICD-10-CM | POA: Diagnosis present

## 2017-05-19 DIAGNOSIS — I2510 Atherosclerotic heart disease of native coronary artery without angina pectoris: Secondary | ICD-10-CM | POA: Diagnosis not present

## 2017-05-19 DIAGNOSIS — Z823 Family history of stroke: Secondary | ICD-10-CM | POA: Diagnosis not present

## 2017-05-19 DIAGNOSIS — I6522 Occlusion and stenosis of left carotid artery: Principal | ICD-10-CM | POA: Diagnosis present

## 2017-05-19 HISTORY — PX: ENDARTERECTOMY: SHX5162

## 2017-05-19 HISTORY — PX: PATCH ANGIOPLASTY: SHX6230

## 2017-05-19 LAB — TYPE AND SCREEN
ABO/RH(D): A POS
Antibody Screen: POSITIVE

## 2017-05-19 SURGERY — ENDARTERECTOMY, CAROTID
Anesthesia: General | Site: Neck | Laterality: Left

## 2017-05-19 MED ORDER — MULTIVITAMINS PO CAPS: 1.0000 | ORAL_CAPSULE | Freq: Every day | ORAL | Status: DC

## 2017-05-19 MED ORDER — PROPOFOL 10 MG/ML IV BOLUS
INTRAVENOUS | Status: AC
  Filled 2017-05-19: qty 20

## 2017-05-19 MED ORDER — AMLODIPINE BESYLATE 5 MG PO TABS
5.0000 mg | ORAL_TABLET | Freq: Every day | ORAL | Status: DC
  Administered 2017-05-20: 5 mg via ORAL
  Filled 2017-05-19: qty 1

## 2017-05-19 MED ORDER — FENTANYL CITRATE (PF) 100 MCG/2ML IJ SOLN
INTRAMUSCULAR | Status: AC
  Filled 2017-05-19: qty 2

## 2017-05-19 MED ORDER — GLYCOPYRROLATE 0.2 MG/ML IJ SOLN
INTRAMUSCULAR | Status: DC | PRN
  Administered 2017-05-19: 0.2 mg via INTRAVENOUS

## 2017-05-19 MED ORDER — SODIUM CHLORIDE 0.9 % IV SOLN: 500.0000 mL | Freq: Once | INTRAVENOUS | Status: DC | PRN

## 2017-05-19 MED ORDER — HYDROMORPHONE HCL 2 MG PO TABS
1.0000 mg | ORAL_TABLET | ORAL | Status: DC | PRN
  Administered 2017-05-19 – 2017-05-20 (×3): 1 mg via ORAL
  Filled 2017-05-19 (×3): qty 1

## 2017-05-19 MED ORDER — CHLORHEXIDINE GLUCONATE 4 % EX LIQD: 60.0000 mL | Freq: Once | CUTANEOUS | Status: DC

## 2017-05-19 MED ORDER — ADULT MULTIVITAMIN W/MINERALS CH
1.0000 | ORAL_TABLET | Freq: Every day | ORAL | Status: DC
  Administered 2017-05-20: 1 via ORAL
  Filled 2017-05-19: qty 1

## 2017-05-19 MED ORDER — LIDOCAINE 2% (20 MG/ML) 5 ML SYRINGE
INTRAMUSCULAR | Status: DC | PRN
  Administered 2017-05-19: 100 mg via INTRAVENOUS

## 2017-05-19 MED ORDER — EPHEDRINE SULFATE-NACL 50-0.9 MG/10ML-% IV SOSY
PREFILLED_SYRINGE | INTRAVENOUS | Status: DC | PRN
  Administered 2017-05-19: 5 mg via INTRAVENOUS

## 2017-05-19 MED ORDER — ALUM & MAG HYDROXIDE-SIMETH 200-200-20 MG/5ML PO SUSP
15.0000 mL | ORAL | Status: DC | PRN
  Administered 2017-05-19 – 2017-05-20 (×3): 30 mL via ORAL
  Filled 2017-05-19 (×3): qty 30

## 2017-05-19 MED ORDER — TRAMADOL HCL 50 MG PO TABS
50.0000 mg | ORAL_TABLET | Freq: Four times a day (QID) | ORAL | Status: DC | PRN
Start: 2017-05-19 — End: 2017-05-20

## 2017-05-19 MED ORDER — ASPIRIN EC 81 MG PO TBEC: 81.0000 mg | DELAYED_RELEASE_TABLET | Freq: Every day | ORAL | Status: DC

## 2017-05-19 MED ORDER — DIPHENHYDRAMINE HCL 50 MG/ML IJ SOLN
INTRAMUSCULAR | Status: AC
  Filled 2017-05-19: qty 1

## 2017-05-19 MED ORDER — PHENYLEPHRINE HCL 10 MG/ML IJ SOLN
INTRAVENOUS | Status: DC | PRN
  Administered 2017-05-19: 35 ug/min via INTRAVENOUS

## 2017-05-19 MED ORDER — HEPARIN SODIUM (PORCINE) 1000 UNIT/ML IJ SOLN
INTRAMUSCULAR | Status: DC | PRN
  Administered 2017-05-19: 5000 [IU] via INTRAVENOUS

## 2017-05-19 MED ORDER — VITAMIN C 500 MG PO TABS
1000.0000 mg | ORAL_TABLET | Freq: Every day | ORAL | Status: DC
  Administered 2017-05-19: 1000 mg via ORAL
  Filled 2017-05-19: qty 2

## 2017-05-19 MED ORDER — BISACODYL 10 MG RE SUPP: 10.0000 mg | Freq: Every day | RECTAL | Status: DC | PRN

## 2017-05-19 MED ORDER — PHENYLEPHRINE 40 MCG/ML (10ML) SYRINGE FOR IV PUSH (FOR BLOOD PRESSURE SUPPORT)
PREFILLED_SYRINGE | INTRAVENOUS | Status: DC | PRN
  Administered 2017-05-19 (×5): 80 ug via INTRAVENOUS

## 2017-05-19 MED ORDER — LEVOTHYROXINE SODIUM 112 MCG PO TABS
112.0000 ug | ORAL_TABLET | Freq: Every day | ORAL | Status: DC
  Administered 2017-05-20: 112 ug via ORAL
  Filled 2017-05-19: qty 1

## 2017-05-19 MED ORDER — CYCLOBENZAPRINE HCL 10 MG PO TABS
10.0000 mg | ORAL_TABLET | Freq: Three times a day (TID) | ORAL | Status: DC
  Administered 2017-05-19 – 2017-05-20 (×2): 10 mg via ORAL
  Filled 2017-05-19: qty 1

## 2017-05-19 MED ORDER — FENTANYL CITRATE (PF) 100 MCG/2ML IJ SOLN
25.0000 ug | INTRAMUSCULAR | Status: DC | PRN
  Administered 2017-05-19 (×3): 50 ug via INTRAVENOUS

## 2017-05-19 MED ORDER — ACETAMINOPHEN 325 MG RE SUPP: 325.0000 mg | RECTAL | Status: DC | PRN

## 2017-05-19 MED ORDER — ONDANSETRON HCL 4 MG/2ML IJ SOLN: 4.0000 mg | Freq: Once | INTRAMUSCULAR | Status: DC | PRN

## 2017-05-19 MED ORDER — DEXAMETHASONE SODIUM PHOSPHATE 10 MG/ML IJ SOLN
INTRAMUSCULAR | Status: DC | PRN
  Administered 2017-05-19: 5 mg via INTRAVENOUS

## 2017-05-19 MED ORDER — MAGNESIUM SULFATE 2 GM/50ML IV SOLN: 2.0000 g | Freq: Every day | INTRAVENOUS | Status: DC | PRN

## 2017-05-19 MED ORDER — PANTOPRAZOLE SODIUM 40 MG PO TBEC
40.0000 mg | DELAYED_RELEASE_TABLET | Freq: Every day | ORAL | Status: DC
  Administered 2017-05-20: 40 mg via ORAL
  Filled 2017-05-19: qty 1

## 2017-05-19 MED ORDER — ONDANSETRON HCL 4 MG/2ML IJ SOLN
INTRAMUSCULAR | Status: DC | PRN
  Administered 2017-05-19: 4 mg via INTRAVENOUS

## 2017-05-19 MED ORDER — LACTATED RINGERS IV SOLN
INTRAVENOUS | Status: DC
  Administered 2017-05-19 (×2): via INTRAVENOUS

## 2017-05-19 MED ORDER — ONDANSETRON HCL 4 MG/2ML IJ SOLN
4.0000 mg | Freq: Four times a day (QID) | INTRAMUSCULAR | Status: DC | PRN
  Administered 2017-05-20: 4 mg via INTRAVENOUS
  Filled 2017-05-19: qty 2

## 2017-05-19 MED ORDER — MONTELUKAST SODIUM 10 MG PO TABS
10.0000 mg | ORAL_TABLET | Freq: Every day | ORAL | Status: DC
  Administered 2017-05-20: 10 mg via ORAL
  Filled 2017-05-19: qty 1

## 2017-05-19 MED ORDER — VANCOMYCIN HCL IN DEXTROSE 1-5 GM/200ML-% IV SOLN
1000.0000 mg | Freq: Two times a day (BID) | INTRAVENOUS | Status: AC
  Administered 2017-05-19 – 2017-05-20 (×2): 1000 mg via INTRAVENOUS
  Filled 2017-05-19 (×2): qty 200

## 2017-05-19 MED ORDER — SODIUM CHLORIDE 0.9 % IV SOLN
0.1500 ug/kg/min | INTRAVENOUS | Status: DC
  Administered 2017-05-19: .2 ug/kg/min via INTRAVENOUS
  Filled 2017-05-19 (×3): qty 2000

## 2017-05-19 MED ORDER — ONDANSETRON HCL 4 MG/2ML IJ SOLN
INTRAMUSCULAR | Status: AC
  Filled 2017-05-19: qty 2

## 2017-05-19 MED ORDER — DOCUSATE SODIUM 100 MG PO CAPS
100.0000 mg | ORAL_CAPSULE | Freq: Every day | ORAL | Status: DC
  Administered 2017-05-20: 100 mg via ORAL
  Filled 2017-05-19: qty 1

## 2017-05-19 MED ORDER — GUAIFENESIN-DM 100-10 MG/5ML PO SYRP: 15.0000 mL | ORAL_SOLUTION | ORAL | Status: DC | PRN

## 2017-05-19 MED ORDER — ALBUTEROL SULFATE (2.5 MG/3ML) 0.083% IN NEBU: 2.5000 mg | INHALATION_SOLUTION | Freq: Four times a day (QID) | RESPIRATORY_TRACT | Status: DC | PRN

## 2017-05-19 MED ORDER — VANCOMYCIN HCL IN DEXTROSE 1-5 GM/200ML-% IV SOLN
1000.0000 mg | INTRAVENOUS | Status: AC
  Administered 2017-05-19: 1000 mg via INTRAVENOUS

## 2017-05-19 MED ORDER — 0.9 % SODIUM CHLORIDE (POUR BTL) OPTIME
TOPICAL | Status: DC | PRN
  Administered 2017-05-19: 2000 mL

## 2017-05-19 MED ORDER — POTASSIUM CHLORIDE CRYS ER 20 MEQ PO TBCR: 20.0000 meq | EXTENDED_RELEASE_TABLET | Freq: Every day | ORAL | Status: DC | PRN

## 2017-05-19 MED ORDER — ESMOLOL HCL 100 MG/10ML IV SOLN
INTRAVENOUS | Status: DC | PRN
  Administered 2017-05-19: 30 mg via INTRAVENOUS

## 2017-05-19 MED ORDER — B-12 5000 MCG PO TBDP: 5000.0000 ug | ORAL_TABLET | Freq: Every day | ORAL | Status: DC

## 2017-05-19 MED ORDER — SODIUM CHLORIDE 0.9 % IV SOLN
INTRAVENOUS | Status: DC
  Administered 2017-05-19: 20:00:00 via INTRAVENOUS

## 2017-05-19 MED ORDER — HYDRALAZINE HCL 20 MG/ML IJ SOLN: 5.0000 mg | INTRAMUSCULAR | Status: DC | PRN

## 2017-05-19 MED ORDER — MIDAZOLAM HCL 5 MG/5ML IJ SOLN
INTRAMUSCULAR | Status: DC | PRN
  Administered 2017-05-19: 2 mg via INTRAVENOUS

## 2017-05-19 MED ORDER — SUGAMMADEX SODIUM 500 MG/5ML IV SOLN
INTRAVENOUS | Status: AC
Start: 2017-05-19 — End: 2017-05-19
  Filled 2017-05-19: qty 5

## 2017-05-19 MED ORDER — ENALAPRIL MALEATE 10 MG PO TABS
40.0000 mg | ORAL_TABLET | Freq: Every day | ORAL | Status: DC
  Administered 2017-05-20: 40 mg via ORAL
  Filled 2017-05-19 (×2): qty 4

## 2017-05-19 MED ORDER — SODIUM CHLORIDE 0.9 % IV SOLN: INTRAVENOUS | Status: DC

## 2017-05-19 MED ORDER — PHENOL 1.4 % MT LIQD: 1.0000 | OROMUCOSAL | Status: DC | PRN

## 2017-05-19 MED ORDER — MIDAZOLAM HCL 2 MG/2ML IJ SOLN
INTRAMUSCULAR | Status: AC
  Filled 2017-05-19: qty 2

## 2017-05-19 MED ORDER — POLYETHYLENE GLYCOL 3350 17 G PO PACK: 17.0000 g | PACK | Freq: Every day | ORAL | Status: DC | PRN

## 2017-05-19 MED ORDER — PROTAMINE SULFATE 10 MG/ML IV SOLN
INTRAVENOUS | Status: AC
  Filled 2017-05-19: qty 5

## 2017-05-19 MED ORDER — METOPROLOL TARTRATE 5 MG/5ML IV SOLN: 2.0000 mg | INTRAVENOUS | Status: DC | PRN

## 2017-05-19 MED ORDER — HYDROMORPHONE HCL 1 MG/ML IJ SOLN: 0.5000 mg | INTRAMUSCULAR | Status: DC | PRN

## 2017-05-19 MED ORDER — PROTAMINE SULFATE 10 MG/ML IV SOLN
INTRAVENOUS | Status: DC | PRN
  Administered 2017-05-19: 50 mg via INTRAVENOUS

## 2017-05-19 MED ORDER — METOPROLOL SUCCINATE ER 25 MG PO TB24
25.0000 mg | ORAL_TABLET | Freq: Every day | ORAL | Status: DC
  Administered 2017-05-20: 25 mg via ORAL
  Filled 2017-05-19: qty 1

## 2017-05-19 MED ORDER — VITAMIN D 1000 UNITS PO TABS
2000.0000 [IU] | ORAL_TABLET | Freq: Every day | ORAL | Status: DC
  Administered 2017-05-19: 2000 [IU] via ORAL
  Filled 2017-05-19: qty 2

## 2017-05-19 MED ORDER — DEXAMETHASONE SODIUM PHOSPHATE 10 MG/ML IJ SOLN
INTRAMUSCULAR | Status: AC
  Filled 2017-05-19: qty 1

## 2017-05-19 MED ORDER — LABETALOL HCL 5 MG/ML IV SOLN
10.0000 mg | INTRAVENOUS | Status: DC | PRN
  Administered 2017-05-19: 10 mg via INTRAVENOUS

## 2017-05-19 MED ORDER — PROPOFOL 10 MG/ML IV BOLUS
INTRAVENOUS | Status: DC | PRN
  Administered 2017-05-19: 150 mg via INTRAVENOUS

## 2017-05-19 MED ORDER — VITAMIN B-6 100 MG PO TABS
100.0000 mg | ORAL_TABLET | Freq: Every day | ORAL | Status: DC
  Administered 2017-05-20: 100 mg via ORAL
  Filled 2017-05-19: qty 1

## 2017-05-19 MED ORDER — FOLIC ACID 1 MG PO TABS
1.0000 mg | ORAL_TABLET | Freq: Every day | ORAL | Status: DC
  Administered 2017-05-19: 1 mg via ORAL
  Filled 2017-05-19: qty 1

## 2017-05-19 MED ORDER — SUGAMMADEX SODIUM 200 MG/2ML IV SOLN
INTRAVENOUS | Status: DC | PRN
  Administered 2017-05-19: 100 mg via INTRAVENOUS

## 2017-05-19 MED ORDER — LORAZEPAM 0.5 MG PO TABS
0.5000 mg | ORAL_TABLET | Freq: Three times a day (TID) | ORAL | Status: DC | PRN
  Administered 2017-05-19 (×2): 0.5 mg via ORAL
  Filled 2017-05-19 (×3): qty 1

## 2017-05-19 MED ORDER — ATORVASTATIN CALCIUM 40 MG PO TABS
40.0000 mg | ORAL_TABLET | Freq: Every evening | ORAL | Status: DC
  Administered 2017-05-19: 40 mg via ORAL
  Filled 2017-05-19: qty 1

## 2017-05-19 MED ORDER — FENTANYL CITRATE (PF) 250 MCG/5ML IJ SOLN
INTRAMUSCULAR | Status: AC
  Filled 2017-05-19: qty 5

## 2017-05-19 MED ORDER — ROCURONIUM BROMIDE 10 MG/ML (PF) SYRINGE
PREFILLED_SYRINGE | INTRAVENOUS | Status: DC | PRN
  Administered 2017-05-19: 50 mg via INTRAVENOUS

## 2017-05-19 MED ORDER — VANCOMYCIN HCL IN DEXTROSE 1-5 GM/200ML-% IV SOLN
INTRAVENOUS | Status: AC
  Administered 2017-05-19: 1000 mg via INTRAVENOUS
  Filled 2017-05-19: qty 200

## 2017-05-19 MED ORDER — LABETALOL HCL 5 MG/ML IV SOLN
INTRAVENOUS | Status: AC
  Filled 2017-05-19: qty 4

## 2017-05-19 MED ORDER — SODIUM CHLORIDE 0.9 % IV SOLN
INTRAVENOUS | Status: DC | PRN
  Administered 2017-05-19: 10:00:00

## 2017-05-19 MED ORDER — DIPHENHYDRAMINE HCL 50 MG/ML IJ SOLN
INTRAMUSCULAR | Status: DC | PRN
  Administered 2017-05-19: 12.5 mg via INTRAVENOUS

## 2017-05-19 MED ORDER — ACETAMINOPHEN 325 MG PO TABS
325.0000 mg | ORAL_TABLET | ORAL | Status: DC | PRN
  Administered 2017-05-20: 650 mg via ORAL
  Filled 2017-05-19: qty 2

## 2017-05-19 MED ORDER — LIDOCAINE HCL (PF) 1 % IJ SOLN
INTRAMUSCULAR | Status: AC
  Filled 2017-05-19: qty 30

## 2017-05-19 MED ORDER — FENTANYL CITRATE (PF) 250 MCG/5ML IJ SOLN
INTRAMUSCULAR | Status: DC | PRN
  Administered 2017-05-19: 50 ug via INTRAVENOUS
  Administered 2017-05-19: 25 ug via INTRAVENOUS
  Administered 2017-05-19: 75 ug via INTRAVENOUS

## 2017-05-19 SURGICAL SUPPLY — 37 items
ADH SKN CLS APL DERMABOND .7 (GAUZE/BANDAGES/DRESSINGS) ×2
CANISTER SUCT 3000ML PPV (MISCELLANEOUS) ×3 IMPLANT
CANNULA VESSEL 3MM 2 BLNT TIP (CANNULA) ×6 IMPLANT
CATH ROBINSON RED A/P 18FR (CATHETERS) ×3 IMPLANT
CLIP LIGATING EXTRA MED SLVR (CLIP) ×3 IMPLANT
CLIP LIGATING EXTRA SM BLUE (MISCELLANEOUS) ×3 IMPLANT
CRADLE DONUT ADULT HEAD (MISCELLANEOUS) ×3 IMPLANT
DERMABOND ADVANCED (GAUZE/BANDAGES/DRESSINGS) ×1
DERMABOND ADVANCED .7 DNX12 (GAUZE/BANDAGES/DRESSINGS) ×2 IMPLANT
DRAIN HEMOVAC 1/8 X 5 (WOUND CARE) IMPLANT
ELECT REM PT RETURN 9FT ADLT (ELECTROSURGICAL) ×3
ELECTRODE REM PT RTRN 9FT ADLT (ELECTROSURGICAL) ×2 IMPLANT
EVACUATOR SILICONE 100CC (DRAIN) IMPLANT
GLOVE BIO SURGEON STRL SZ 6.5 (GLOVE) ×2 IMPLANT
GLOVE BIOGEL PI IND STRL 6.5 (GLOVE) IMPLANT
GLOVE BIOGEL PI IND STRL 7.0 (GLOVE) IMPLANT
GLOVE BIOGEL PI INDICATOR 6.5 (GLOVE) ×2
GLOVE BIOGEL PI INDICATOR 7.0 (GLOVE) ×1
GLOVE ECLIPSE 6.5 STRL STRAW (GLOVE) ×2 IMPLANT
GLOVE SS BIOGEL STRL SZ 7.5 (GLOVE) ×2 IMPLANT
GLOVE SUPERSENSE BIOGEL SZ 7.5 (GLOVE) ×1
GOWN STRL REUS W/ TWL LRG LVL3 (GOWN DISPOSABLE) ×6 IMPLANT
GOWN STRL REUS W/TWL LRG LVL3 (GOWN DISPOSABLE) ×12
KIT BASIN OR (CUSTOM PROCEDURE TRAY) ×3 IMPLANT
KIT ROOM TURNOVER OR (KITS) ×3 IMPLANT
NS IRRIG 1000ML POUR BTL (IV SOLUTION) ×6 IMPLANT
PACK CAROTID (CUSTOM PROCEDURE TRAY) ×3 IMPLANT
PAD ARMBOARD 7.5X6 YLW CONV (MISCELLANEOUS) ×6 IMPLANT
PATCH HEMASHIELD 8X75 (Vascular Products) ×1 IMPLANT
SHUNT CAROTID BYPASS 10 (VASCULAR PRODUCTS) ×1 IMPLANT
SUT ETHILON 3 0 PS 1 (SUTURE) IMPLANT
SUT PROLENE 6 0 CC (SUTURE) ×3 IMPLANT
SUT VIC AB 3-0 SH 27 (SUTURE) ×6
SUT VIC AB 3-0 SH 27X BRD (SUTURE) ×4 IMPLANT
SUT VICRYL 4-0 PS2 18IN ABS (SUTURE) ×3 IMPLANT
TOWEL GREEN STERILE (TOWEL DISPOSABLE) ×3 IMPLANT
WATER STERILE IRR 1000ML POUR (IV SOLUTION) ×3 IMPLANT

## 2017-05-19 NOTE — Anesthesia Procedure Notes (Signed)
Arterial Line Insertion Start/End3/25/2019 9:05 AM, 05/19/2017 9:16 AM Performed by: Wilburn Cornelia, CRNA, CRNA  Patient location: Pre-op. Preanesthetic checklist: patient identified, IV checked, site marked, risks and benefits discussed, surgical consent, monitors and equipment checked, pre-op evaluation and timeout performed Lidocaine 1% used for infiltration Right, radial was placed Catheter size: 20 G Hand hygiene performed  and maximum sterile barriers used  Allen's test indicative of satisfactory collateral circulation Attempts: 2 Procedure performed without using ultrasound guided technique. Following insertion, dressing applied and Biopatch. Post procedure assessment: normal  Patient tolerated the procedure well with no immediate complications.

## 2017-05-19 NOTE — Interval H&P Note (Signed)
History and Physical Interval Note:  05/19/2017 9:32 AM  Belinda Clark  has presented today for surgery, with the diagnosis of LEFT CAROTID STENOSIS  The various methods of treatment have been discussed with the patient and family. After consideration of risks, benefits and other options for treatment, the patient has consented to  Procedure(s): ENDARTERECTOMY CAROTID LEFT (Left) as a surgical intervention .  The patient's history has been reviewed, patient examined, no change in status, stable for surgery.  I have reviewed the patient's chart and labs.  Questions were answered to the patient's satisfaction.     Curt Jews

## 2017-05-19 NOTE — Anesthesia Procedure Notes (Signed)
Procedure Name: Intubation Date/Time: 05/19/2017 9:45 AM Performed by: Wilburn Cornelia, CRNA Pre-anesthesia Checklist: Patient identified, Emergency Drugs available, Suction available, Patient being monitored and Timeout performed Patient Re-evaluated:Patient Re-evaluated prior to induction Oxygen Delivery Method: Circle system utilized Preoxygenation: Pre-oxygenation with 100% oxygen Induction Type: IV induction Ventilation: Mask ventilation without difficulty Laryngoscope Size: Mac and 3 Grade View: Grade I Tube type: Oral Number of attempts: 1 Airway Equipment and Method: Stylet and LTA kit utilized Placement Confirmation: ETT inserted through vocal cords under direct vision,  positive ETCO2,  CO2 detector and breath sounds checked- equal and bilateral Secured at: 21 cm Tube secured with: Tape Dental Injury: Teeth and Oropharynx as per pre-operative assessment

## 2017-05-19 NOTE — Progress Notes (Signed)
Patient extremely aggravated and anxious, voicing that she is upset with the hold time for a room and will " walk out of here if no room is ready by 4pm." Educated on the bed situation and multiple apologies given. Ordered PRN dose of Ativan for patient via pharmacy- awaiting dose to be delivered and will give patient her dose per her request.

## 2017-05-19 NOTE — Anesthesia Preprocedure Evaluation (Addendum)
Anesthesia Evaluation  Patient identified by MRN, date of birth, ID band Patient awake    Reviewed: Allergy & Precautions, NPO status , Patient's Chart, lab work & pertinent test results, reviewed documented beta blocker date and time   History of Anesthesia Complications (+) PONV and history of anesthetic complications  Airway Mallampati: II  TM Distance: >3 FB Neck ROM: Full    Dental  (+) Partial Upper, Partial Lower   Pulmonary asthma , former smoker,    Pulmonary exam normal breath sounds clear to auscultation       Cardiovascular hypertension, Pt. on medications and Pt. on home beta blockers Normal cardiovascular exam Rhythm:Regular Rate:Normal  ECG: NSR, rate 75 Negative stress test   Neuro/Psych Anxiety negative neurological ROS     GI/Hepatic Neg liver ROS, GERD  ,  Endo/Other  Hypothyroidism   Renal/GU negative Renal ROS     Musculoskeletal chronic back pain on narcotics   Abdominal   Peds  Hematology HLD    Anesthesia Other Findings LEFT CAROTID STENOSIS  Reproductive/Obstetrics                            Anesthesia Physical Anesthesia Plan  ASA: IV  Anesthesia Plan: General   Post-op Pain Management:    Induction: Intravenous  PONV Risk Score and Plan: 4 or greater and Ondansetron, Dexamethasone, Treatment may vary due to age or medical condition and Midazolam  Airway Management Planned: Oral ETT  Additional Equipment: Arterial line  Intra-op Plan:   Post-operative Plan: Extubation in OR  Informed Consent: I have reviewed the patients History and Physical, chart, labs and discussed the procedure including the risks, benefits and alternatives for the proposed anesthesia with the patient or authorized representative who has indicated his/her understanding and acceptance.   Dental advisory given  Plan Discussed with: CRNA  Anesthesia Plan Comments:         Anesthesia Quick Evaluation

## 2017-05-19 NOTE — Transfer of Care (Signed)
Immediate Anesthesia Transfer of Care Note  Patient: CHERONDA ERCK  Procedure(s) Performed: ENDARTERECTOMY CAROTID LEFT (Left ) PATCH ANGIOPLASTY LEFT CAROTID ARTERY USING HEMASHIELD PLATINUM FINESSE PATCH (Left Neck)  Patient Location: PACU  Anesthesia Type:General  Level of Consciousness: awake, alert  and oriented  Airway & Oxygen Therapy: Patient Spontanous Breathing and Patient connected to nasal cannula oxygen  Post-op Assessment: Report given to RN, Post -op Vital signs reviewed and stable, Patient moving all extremities X 4 and Patient able to stick tongue midline  Post vital signs: Reviewed and stable  Last Vitals:  Vitals Value Taken Time  BP 163/80 05/19/2017 11:46 AM  Temp    Pulse 109 05/19/2017 11:47 AM  Resp 27 05/19/2017 11:47 AM  SpO2 94 % 05/19/2017 11:47 AM  Vitals shown include unvalidated device data.  Last Pain:  Vitals:   05/19/17 0749  TempSrc: Oral         Complications: No apparent anesthesia complications

## 2017-05-19 NOTE — Op Note (Signed)
    OPERATIVE REPORT  DATE OF SURGERY: 05/19/2017  PATIENT: Belinda Clark, 77 y.o. female MRN: [id number]  DOB: 1941/02/15  PRE-OPERATIVE DIAGNOSIS: Severe asymptomatic left internal carotid artery stenosis  POST-OPERATIVE DIAGNOSIS:  Same  PROCEDURE: Left carotid endarterectomy and Dacron patch angioplasty  SURGEON:  Curt Jews, M.D.  PHYSICIAN ASSISTANT: Liana Crocker PA-C  ANESTHESIA: General  EBL: 50 ml  Total I/O In: 1900 [I.V.:1900] Out: 750 [Urine:700; Blood:50]  BLOOD ADMINISTERED: None  DRAINS: None  SPECIMEN: None  COUNTS CORRECT:  YES  PLAN OF CARE: PACU neurologically intact  PATIENT DISPOSITION:  PACU - hemodynamically stable  PROCEDURE DETAILS: The patient was taken to the operating placed supine position where the area of the left neck was prepped and draped in usual sterile fashion.  Incision was made anterior sternocleidomastoid and carried down through the platysma with electrocautery.  Sternocleidomastoid reflected posteriorly and the carotid sheath was opened.  Facial vein was ligated with 3-0 silk ties and divided.  The vagus and hypoglossal nerves were identified and preserved.  The common carotid artery was encircled with an umbilical tape and Rummel tourniquet.  Dissection was changed onto the bifurcation.  The superior thyroid artery was encircled with a 2-0 silk Potts tie, the external carotid was encircled with a blue vessel loop and the internal was encircled with an umbilical tape and Rummel tourniquet.  The patient was given 5000 units intravenous heparin and after adequate circulation time the internal/external and common carotid arteries were occluded.  The common carotid artery was opened with an 11 blade and extended longitudinally with Potts scissors.  The patient had minimal atherosclerotic change above the bifurcation.  A 10 shunt was passed up the internal carotid and allowed to backbleed then down the common carotid where it was secured  with a Rummel tourniquet.  The endarterectomy was gone on the common carotid artery and the plaque was divided proximally with Potts scissors.  The endarterectomy was controlled to the bifurcation and the external carotid was endarterectomized with an eversion technique and the internal was endarterectomized with an open technique.  Remaining atheromatous cerumen debris was removed from the endarterectomy plane.  The Finesse Hemashield Dacron patch was brought onto the field and was sewn as a patch angioplasty with a running 6-0 Prolene suture.  Prior to completion of closure the clamps were removed in the usual flushing maneuvers were undertaken.  The anastomosis was completed and flow restored first to the external then the internal carotid arteries.  Excellent flow characteristics were noted with hand-held Doppler in the internal and external carotid arteries.  Patient was given 50 mg of protamine to reverse the heparin.  The irrigated with saline.  Hemostasis obtained with cautery.  Wounds were closed with several 3-0 Vicryl sutures to reapproximate the sternocleidomastoid over the carotid sheath.  Next the platysma was closed with a running 3-0 Vicryl suture and finally the skin was closed with 4 septic or Vicryl stitch.  The patient was awake and neurologically intact in the operating room and transferred to the recovery room in stable condition   Rosetta Posner, M.D., [facility] 05/19/2017 2:26 PM

## 2017-05-19 NOTE — Progress Notes (Signed)
  Day of Surgery Note    Subjective:  Says she hurts   Vitals:   05/19/17 1201 05/19/17 1215  BP: (!) 143/71 133/67  Pulse: 94 95  Resp: 17 17  Temp:    SpO2: 99% 100%    Incisions:   Clean and dry without hematoma Extremities:  Moving all extremities equally Cardiac:  regular Lungs:  Non labored Neuro:  In tact; tongue is midline   Assessment/Plan:  This is a 77 y.o. female who is s/p  Left carotid endarterectomy  -pt doing well in recovery -neuro in tact; to [place] when bed available -if uneventful night, anticipate discharge tomorrow   Leontine Locket, PA-C 05/19/2017 12:31 PM [phone]

## 2017-05-19 NOTE — Anesthesia Postprocedure Evaluation (Signed)
Anesthesia Post Note  Patient: Belinda Clark  Procedure(s) Performed: ENDARTERECTOMY CAROTID LEFT (Left ) PATCH ANGIOPLASTY LEFT CAROTID ARTERY USING HEMASHIELD PLATINUM FINESSE PATCH (Left Neck)     Patient location during evaluation: PACU Anesthesia Type: General Level of consciousness: awake and alert Pain management: pain level controlled Vital Signs Assessment: post-procedure vital signs reviewed and stable Respiratory status: spontaneous breathing, nonlabored ventilation, respiratory function stable and patient connected to nasal cannula oxygen Cardiovascular status: blood pressure returned to baseline and stable Postop Assessment: no apparent nausea or vomiting Anesthetic complications: no    Last Vitals:  Vitals:   05/19/17 1605 05/19/17 1929  BP: (!) 155/73 138/63  Pulse: 87 87  Resp:  (!) 22  Temp: 37.3 C 37.2 C  SpO2:  98%    Last Pain:  Vitals:   05/19/17 1933  TempSrc:   PainSc: 5                  Annison Birchard P Bellarose Burtt

## 2017-05-19 NOTE — Discharge Instructions (Signed)
° °  [facility] ° °Discharge Instructions °  °Carotid Endarterectomy (CEA) ° °Please refer to the following instructions for your post-procedure care. Your surgeon or physician assistant will discuss any changes with you. ° °Activity ° °You are encouraged to walk as much as you can. You can slowly return to normal activities but must avoid strenuous activity and heavy lifting until your doctor tell you it's okay. Avoid activities such as vacuuming or swinging a golf club. You can drive after one week if you are comfortable and you are no longer taking prescription pain medications. It is normal to feel tired for serval weeks after your surgery. It is also normal to have difficulty with sleep habits, eating, and bowel movements after surgery. These will go away with time. ° °Bathing/Showering ° °Shower daily after you go home. Do not soak in a bathtub, hot tub, or swim until the incision heals completely. ° °Incision Care ° °Shower every day. Clean your incision with mild soap and water. Pat the area dry with a clean towel. You do not need a bandage unless otherwise instructed. Do not apply any ointments or creams to your incision. You may have skin glue on your incision. Do not peel it off. It will come off on its own in about one week. Your incision may feel thickened and raised for several weeks after your surgery. This is normal and the skin will soften over time.  ° °For Men Only: It's okay to shave around the incision but do not shave the incision itself for 2 weeks. It is common to have numbness under your chin that could last for several months. ° °Diet ° °Resume your normal diet. There are no special food restrictions following this procedure. A low fat/low cholesterol diet is recommended for all patients with vascular disease. In order to heal from your surgery, it is CRITICAL to get adequate nutrition. Your body requires vitamins, minerals, and protein. Vegetables are the  best source of vitamins and minerals. Vegetables also provide the perfect balance of protein. Processed food has little nutritional value, so try to avoid this. ° °Medications ° °Resume taking all of your medications unless your doctor or physician assistant tells you not to. If your incision is causing pain, you may take over-the- counter pain relievers such as acetaminophen (Tylenol). If you were prescribed a stronger pain medication, please be aware these medications can cause nausea and constipation. Prevent nausea by taking the medication with a snack or meal. Avoid constipation by drinking plenty of fluids and eating foods with a high amount of fiber, such as fruits, vegetables, and grains.  °Do not take Tylenol if you are taking prescription pain medications. ° °Follow Up ° °Our office will schedule a follow up appointment 2-3 weeks following discharge. ° °Please call us immediately for any of the following conditions ° °Increased pain, redness, drainage (pus) from your incision site. °Fever of 101 degrees or higher. °If you should develop stroke (slurred speech, difficulty swallowing, weakness on one side of your body, loss of vision) you should call 911 and go to the nearest emergency room. ° °Reduce your risk of vascular disease: ° °Stop smoking. If you would like help call [facility] at 1-800-QUIT-NOW (1-800-784-8669) or [place] at 336-586-4000. °Manage your cholesterol °Maintain a desired weight °Control your diabetes °Keep your blood pressure down ° °If you have any questions, please call the office at [phone]. ° °

## 2017-05-20 ENCOUNTER — Encounter (HOSPITAL_COMMUNITY): Payer: Self-pay | Admitting: Vascular Surgery

## 2017-05-20 LAB — BASIC METABOLIC PANEL
ANION GAP: 8 (ref 5–15)
BUN: 11 mg/dL (ref 6–20)
CO2: 24 mmol/L (ref 22–32)
Calcium: 8.1 mg/dL — ABNORMAL LOW (ref 8.9–10.3)
Chloride: 107 mmol/L (ref 101–111)
Creatinine, Ser: 0.44 mg/dL (ref 0.44–1.00)
GFR calc Af Amer: >60 mL/min (ref >60)
Glucose, Bld: 127 mg/dL — ABNORMAL HIGH (ref 65–99)
POTASSIUM: 3.5 mmol/L (ref 3.5–5.1)
SODIUM: 139 mmol/L (ref 135–145)

## 2017-05-20 LAB — CBC
HCT: 32.5 % — ABNORMAL LOW (ref 36.0–46.0)
HEMOGLOBIN: 10.3 g/dL — AB (ref 12.0–15.0)
MCH: 29.6 pg (ref 26.0–34.0)
MCHC: 31.7 g/dL (ref 30.0–36.0)
MCV: 93.4 fL (ref 78.0–100.0)
PLATELETS: 189 10*3/uL (ref 150–400)
RBC: 3.48 MIL/uL — AB (ref 3.87–5.11)
RDW: 13.5 % (ref 11.5–15.5)
WBC: 9.2 10*3/uL (ref 4.0–10.5)

## 2017-05-20 NOTE — Care Management Note (Signed)
Case Management Note Marvetta Gibbons RN, BSN Unit 4E-Case Manager [phone]  Patient Details  Name: Belinda Clark MRN: [id number] Date of Birth: 06-13-1940  Subjective/Objective:  Pt admitted s/p CEA                 Action/Plan: PTA pt lived at home, plan to return home at discharge- no CM needs noted for transition home.   Expected Discharge Date:  05/20/17               Expected Discharge Plan:  Home/Self Care  In-House Referral:  NA  Discharge planning Services  CM Consult  Post Acute Care Choice:  NA Choice offered to:  NA  DME Arranged:    DME Agency:     HH Arranged:    HH Agency:     Status of Service:  Completed, signed off  If discussed at [place] of Stay Meetings, dates discussed:    Discharge Disposition: home/self care   Additional Comments:  Dawayne Patricia, RN 05/20/2017, 11:14 AM

## 2017-05-20 NOTE — Progress Notes (Signed)
05/20/2017 Discharge AVS meds taken today and those due this evening reviewed with SWAT nurse.  Follow-up appointments and when to call md reviewed.  D/C IV and TELE.  Questions and concerns addressed.   D/C home per orders. [place]

## 2017-05-20 NOTE — Progress Notes (Signed)
Discharge instructions (including medications) discussed with and copy provided to patient/caregiver 

## 2017-05-20 NOTE — Progress Notes (Addendum)
  Progress Note    05/20/2017 7:33 AM 1 Day Post-Op  Subjective:  Says she is sore at her incision and her throat  Tm 99 afebrile 758'I-325'Q systolic HR 98'Y-641'R  83% RA  Vitals:   05/19/17 1929 05/20/17 0023  BP: 138/63 101/89  Pulse: 87 90  Resp: (!) 22 (!) 26  Temp: 99 F (37.2 C) 98.8 F (37.1 C)  SpO2: 98% 92%    Physical Exam: General:  In no distress Lungs:  Non labored Incisions:  Clean and dry without hematoma Extremities:  Moving all extremities equally   CBC    Component Value Date/Time   WBC 9.2 05/20/2017 0230   RBC 3.48 (L) 05/20/2017 0230   HGB 10.3 (L) 05/20/2017 0230   HCT 32.5 (L) 05/20/2017 0230   PLT 189 05/20/2017 0230   MCV 93.4 05/20/2017 0230   MCH 29.6 05/20/2017 0230   MCHC 31.7 05/20/2017 0230   RDW 13.5 05/20/2017 0230    BMET    Component Value Date/Time   NA 139 05/20/2017 0230   K 3.5 05/20/2017 0230   CL 107 05/20/2017 0230   CO2 24 05/20/2017 0230   GLUCOSE 127 (H) 05/20/2017 0230   BUN 11 05/20/2017 0230   CREATININE 0.44 05/20/2017 0230   CALCIUM 8.1 (L) 05/20/2017 0230   GFRNONAA >60 05/20/2017 0230   GFRAA >60 05/20/2017 0230    INR    Component Value Date/Time   INR 0.91 05/16/2017 1329     Intake/Output Summary (Last 24 hours) at 05/20/2017 0733 Last data filed at 05/20/2017 0300 Gross per 24 hour  Intake 2613.83 ml  Output 1650 ml  Net 963.83 ml     Assessment:  77 y.o. female is s/p:  Left carotid endarterectomy  1 Day Post-Op  Plan: -neuro in tact -pt doing well and has ambulated. -will discharge home today -okay to proceed with back surgery when she is ready    Leontine Locket, PA-C [facility] [phone] 05/20/2017 7:33 AM  I have examined the patient, reviewed and agree with above.  Looks good this morning.  Comfortable.  Has ambulated and no neuro changes.  For discharge this morning  Curt Jews, MD 05/20/2017 8:10 AM

## 2017-05-20 NOTE — Discharge Summary (Signed)
Discharge Summary     Belinda Clark 1940-11-29 77 y.o. female  [id number]  Admission Date: 05/19/2017  Discharge Date: 05/20/17  Physician: Rosetta Posner, MD  Admission Diagnosis: Asymptomatic stenosis of left carotid artery [I65.22]   HPI:   This is a 77 y.o. female  here today for his continued discussion.  She had been scheduled for left carotid endarterectomy but had markedly worsening back pain and had to cancel.  She has seen Dr. Glenna Fellows and has had 2 separate spine injections and has another spine injection planned for this Thursday, March 15.  She continued to have difficulty with her spine and may need to have spine surgery but cannot have this done until she has carotid endarterectomy.  Hospital Course:  The patient was admitted to the hospital and taken to the operating room on 05/19/2017 and underwent left carotid endarterectomy.  The pt tolerated the procedure well and was transported to the PACU in good condition.   By POD 1, the pt neuro status was in tact.  She was doing well and discharged home.  It is okay for her to proceed with back surgery when she is ready.  The remainder of the hospital course consisted of increasing mobilization and increasing intake of solids without difficulty.   Recent Labs    05/20/17 0230  NA 139  K 3.5  CL 107  CO2 24  GLUCOSE 127*  BUN 11  CALCIUM 8.1*   Recent Labs    05/20/17 0230  WBC 9.2  HGB 10.3*  HCT 32.5*  PLT 189   No results for input(s): INR in the last 72 hours.   Discharge Instructions    Discharge patient   Complete by:  As directed    Discharge disposition:  01-Home or Self Care   Discharge patient date:  05/20/2017      Discharge Diagnosis:  Asymptomatic stenosis of left carotid artery [I65.22]  Secondary Diagnosis: Patient Active Problem List   Diagnosis Date Noted  . Asymptomatic stenosis of left carotid artery 05/19/2017  . SOB (shortness of breath) 05/02/2014  . Fullness of  breast 02/14/2014  . Hyperlipidemia 11/04/2012  . Occlusion and stenosis of carotid artery without mention of cerebral infarction 10/03/2011  . Coronary artery disease involving native coronary artery 01/04/2011  . PVD (peripheral vascular disease) ([place]) 01/04/2011  . ANKLE SPRAIN 04/30/2007   Past Medical History:  Diagnosis Date  . Anxiety   . Arthritis   . Asthma   . Carotid artery stenosis    40-59 % -Left  . GERD (gastroesophageal reflux disease)   . Glaucoma   . High blood pressure   . HTN (hypertension)   . Hyperlipidemia   . Hypothyroidism   . PONV (postoperative nausea and vomiting)     Allergies as of 05/20/2017      Reactions   Ciprofloxacin Hives   Cleocin [clindamycin Hcl] Hives   Clindamycin Hcl Hives   Iodinated Diagnostic Agents Hives   Iohexol   Sulfa Antibiotics Hives   Cefuroxime Axetil    Clarithromycin    Codeine    Cymbalta [duloxetine Hcl]    Levofloxacin    Metoclopramide Hcl    Penicillins       Medication List    TAKE these medications   alendronate 70 MG tablet Commonly known as:  FOSAMAX Take 70 mg by mouth once a week. Take with a full glass of water on an empty stomach.  Sundays   amLODipine 5 MG  tablet Commonly known as:  NORVASC Take 5 mg by mouth daily.   aspirin 81 MG tablet Take 81 mg by mouth at bedtime.   B-12 5000 MCG Tbdp Take 5,000 mcg by mouth at bedtime.   calcium carbonate 500 MG chewable tablet Commonly known as:  TUMS - dosed in mg elemental calcium Chew 1 tablet by mouth daily as needed for indigestion or heartburn.   calcium carbonate 600 MG Tabs tablet Commonly known as:  OS-CAL Take 1,200 mg by mouth at bedtime.   cyclobenzaprine 10 MG tablet Commonly known as:  FLEXERIL Take 10 mg by mouth 3 (three) times daily.   D3-1000 1000 units tablet Generic drug:  Cholecalciferol Take 2,000 Units by mouth at bedtime.   diphenhydrAMINE 50 MG capsule Commonly known as:  BENADRYL Take 50 mg by mouth 1 hour  prior to your procedure.   enalapril 20 MG tablet Commonly known as:  VASOTEC Take 2 tablets (40 mg total) by mouth daily.   folic acid 283 MCG tablet Commonly known as:  FOLVITE Take 800 mcg by mouth at bedtime.   Garlic 1517 MG Caps Take 1,000 mg by mouth at bedtime.   HYDROmorphone 2 MG tablet Commonly known as:  DILAUDID Take 1 mg by mouth every 4 (four) hours as needed for moderate pain or severe pain.   ibuprofen 200 MG tablet Commonly known as:  ADVIL,MOTRIN Take 400 mg by mouth every 4 (four) hours as needed for pain.   levothyroxine 112 MCG tablet Commonly known as:  SYNTHROID, LEVOTHROID Take 112 mcg by mouth daily before breakfast.   LIPITOR 40 MG tablet Generic drug:  atorvastatin Take 40 mg by mouth every evening.   LORazepam 0.5 MG tablet Commonly known as:  ATIVAN Take 0.5 mg by mouth 3 (three) times daily as needed for sleep.   metoprolol succinate 25 MG 24 hr tablet Commonly known as:  TOPROL-XL Take 25 mg by mouth daily.   multivitamin capsule Take 1 capsule by mouth daily.   Omega 3 1000 MG Caps Take 1,000 mg by mouth at bedtime.   Potassium 99 MG Tabs Take 99 mg by mouth daily.   PROAIR HFA 108 (90 Base) MCG/ACT inhaler Generic drug:  albuterol Inhale 2 puffs into the lungs every 6 (six) hours as needed for wheezing or shortness of breath.   pyridOXINE 100 MG tablet Commonly known as:  VITAMIN B-6 Take 100 mg by mouth daily.   SINGULAIR 10 MG tablet Generic drug:  montelukast Take 10 mg by mouth daily.   traMADol 50 MG tablet Commonly known as:  ULTRAM Take 1 tablet (50 mg total) by mouth every 6 (six) hours as needed.   vitamin C 1000 MG tablet Take 1,000 mg by mouth at bedtime.   vitamin E 400 UNIT capsule Take 400 Units by mouth at bedtime.        Discharge Instructions: .sjrdc  [facility] Discharge Instructions Carotid Endarterectomy (CEA)  Please refer to the following instructions  for your post-procedure care. Your surgeon or physician assistant will discuss any changes with you.  Activity  You are encouraged to walk as much as you can. You can slowly return to normal activities but must avoid strenuous activity and heavy lifting until your doctor tell you it's OK. Avoid activities such as vacuuming or swinging a golf club. You can drive after one week if you are comfortable and you are no longer taking prescription pain medications. It is normal  to feel tired for serval weeks after your surgery. It is also normal to have difficulty with sleep habits, eating, and bowel movements after surgery. These will go away with time.  Bathing/Showering  You may shower after you come home. Do not soak in a bathtub, hot tub, or swim until the incision heals completely.  Incision Care  Shower every day. Clean your incision with mild soap and water. Pat the area dry with a clean towel. You do not need a bandage unless otherwise instructed. Do not apply any ointments or creams to your incision. You may have skin glue on your incision. Do not peel it off. It will come off on its own in about one week. Your incision may feel thickened and raised for several weeks after your surgery. This is normal and the skin will soften over time. For Men Only: It's OK to shave around the incision but do not shave the incision itself for 2 weeks. It is common to have numbness under your chin that could last for several months.  Diet  Resume your normal diet. There are no special food restrictions following this procedure. A low fat/low cholesterol diet is recommended for all patients with vascular disease. In order to heal from your surgery, it is CRITICAL to get adequate nutrition. Your body requires vitamins, minerals, and protein. Vegetables are the best source of vitamins and minerals. Vegetables also provide the perfect balance of protein. Processed food has little nutritional value, so try to avoid  this.  Medications  Resume taking all of your medications unless your doctor or physician assistant tells you not to.  If your incision is causing pain, you may take over-the- counter pain relievers such as acetaminophen (Tylenol). If you were prescribed a stronger pain medication, please be aware these medications can cause nausea and constipation.  Prevent nausea by taking the medication with a snack or meal. Avoid constipation by drinking plenty of fluids and eating foods with a high amount of fiber, such as fruits, vegetables, and grains. Do not take Tylenol if you are taking prescription pain medications.  Follow Up  Our office will schedule a follow up appointment 2-3 weeks following discharge.  Please call us immediately for any of the following conditions  Increased pain, redness, drainage (pus) from your incision site. Fever of 101 degrees or higher. If you should develop stroke (slurred speech, difficulty swallowing, weakness on one side of your body, loss of vision) you should call 911 and go to the nearest emergency room.  Reduce your risk of vascular disease:  Stop smoking. If you would like help call [facility] at 1-800-QUIT-NOW 334-326-2509) or [place] at [phone]. Manage your cholesterol Maintain a desired weight Control your diabetes Keep your blood pressure down  If you have any questions, please call the office at [phone].  Prescriptions given: None given-pt on Tramadol and po dilaudid at home.  Disposition: home  Patient's condition: is Good  Follow up: 1. Dr. Donnetta Hutching in 2 weeks.   Leontine Locket, PA-C [facility] [phone]   --- For [facility] use ---   Modified Rankin score at D/C (0-6): 0  IV medication needed for:  1. Hypertension: No 2. Hypotension: No  Post-op Complications: No  1. Post-op CVA or TIA: No  If yes: Event classification (right eye, left eye, right cortical, left cortical, verterobasilar,  other): n/a  If yes: Timing of event (intra-op, <6 hrs post-op, >=6 hrs post-op, unknown): n/a  2. CN injury: No  If yes: CN n/a injuried   3. Myocardial infarction: No  If yes: Dx by (EKG or clinical, Troponin): n/a  4.  CHF: No  5.  Dysrhythmia (new): No  6. Wound infection: No  7. Reperfusion symptoms: No  8. Return to OR: No  If yes: return to OR for (bleeding, neurologic, other CEA incision, other): n/a  Discharge medications: Statin use:  Yes ASA use:  Yes   Beta blocker use:  Yes ACE-Inhibitor use:  Yes  ARB use:  No CCB use: No P2Y12 Antagonist use: No, [ ]  Plavix, [ ]  Plasugrel, [ ]  Ticlopinine, [ ]  Ticagrelor, [ ]  Other, [ ]  No for medical reason, [ ]  Non-compliant, [ ]  Not-indicated Anti-coagulant use:  No, [ ]  Warfarin, [ ]  Rivaroxaban, [ ]  Dabigatran,

## 2017-05-21 ENCOUNTER — Telehealth: Payer: Self-pay | Admitting: Vascular Surgery

## 2017-05-21 LAB — TYPE AND SCREEN
ABO/RH(D): A POS
Antibody Screen: POSITIVE
DONOR AG TYPE: NEGATIVE
DONOR AG TYPE: NEGATIVE
PT AG TYPE: NEGATIVE
Unit division: 0
Unit division: 0

## 2017-05-21 LAB — BPAM RBC
BLOOD PRODUCT EXPIRATION DATE: 201904022359
Blood Product Expiration Date: 201904022359
UNIT TYPE AND RH: 6200
Unit Type and Rh: 6200

## 2017-05-21 NOTE — Telephone Encounter (Signed)
-----   Message from Mena Goes, RN sent at 05/19/2017  5:19 PM EDT ----- Regarding: 2-3 weeks    ----- Message ----- From: Gabriel Earing, PA-C Sent: 05/19/2017  11:26 AM To: [facility] Charge Pool  S/p left CEA 05/19/17.  F/u with Dr. Donnetta Hutching in 2-3 weeks.  Thanks

## 2017-05-21 NOTE — Consult Note (Signed)
            [facility] CM Primary Care Navigator  05/21/2017  Belinda Clark 1940/09/27 [id number]  Attempt to seepatient at the bedside to identify possible discharge needsbutshe was already dischargedhomeperstaffreport.  Per chart review, patient was admitted for asymptomatic stenosis of left carotid artery (status post left carotid endarterectomy).   Primary care provider's officeis listed as providingtransition of care (TOC)follow-up.   Patient has discharge instruction to follow-up with vascular surgery (Dr. Donnetta Hutching) in 2 weeks.   For additional questions please contact:  Edwena Felty A. Clemmie Marxen, BSN, RN-BC [facility] PRIMARY CARE Navigator Cell: [phone]

## 2017-05-21 NOTE — Telephone Encounter (Signed)
Left pt vm regarding 06/17/17 appt . Mailed letter,

## 2017-05-22 DIAGNOSIS — J449 Chronic obstructive pulmonary disease, unspecified: Secondary | ICD-10-CM | POA: Diagnosis not present

## 2017-05-22 DIAGNOSIS — I10 Essential (primary) hypertension: Secondary | ICD-10-CM | POA: Diagnosis not present

## 2017-06-02 ENCOUNTER — Ambulatory Visit (HOSPITAL_COMMUNITY)
Admission: RE | Admit: 2017-06-02 | Discharge: 2017-06-02 | Disposition: A | Discharge status: Home / Self Care | Payer: Medicare Other | Source: Ambulatory Visit | Attending: Internal Medicine | Admitting: Internal Medicine

## 2017-06-02 DIAGNOSIS — E2839 Other primary ovarian failure: Secondary | ICD-10-CM | POA: Diagnosis not present

## 2017-06-02 DIAGNOSIS — Z780 Asymptomatic menopausal state: Secondary | ICD-10-CM | POA: Diagnosis not present

## 2017-06-02 DIAGNOSIS — M810 Age-related osteoporosis without current pathological fracture: Secondary | ICD-10-CM | POA: Insufficient documentation

## 2017-06-03 ENCOUNTER — Ambulatory Visit: Payer: Medicare Other | Admitting: Vascular Surgery

## 2017-06-17 ENCOUNTER — Ambulatory Visit (INDEPENDENT_AMBULATORY_CARE_PROVIDER_SITE_OTHER): Payer: Medicare Other | Admitting: Vascular Surgery

## 2017-06-17 ENCOUNTER — Encounter: Payer: Self-pay | Admitting: Vascular Surgery

## 2017-06-17 ENCOUNTER — Other Ambulatory Visit: Payer: Self-pay

## 2017-06-17 VITALS — BP 128/76 | HR 85 | Temp 98.0°F | Resp 20 | Ht 63.0 in | Wt 111.0 lb

## 2017-06-17 DIAGNOSIS — I6522 Occlusion and stenosis of left carotid artery: Secondary | ICD-10-CM

## 2017-06-17 NOTE — Progress Notes (Signed)
hyj                                     Patient name: Belinda Clark MRN: [id number] DOB: 15-Apr-1940 Sex: female  REASON FOR VISIT: Follow-up left carotid endarterectomy 05/19/2017  HPI: Belinda Clark is a 77 y.o. female here for follow-up.  She had severe asymptomatic left internal carotid stenosis and underwent uneventful endarterectomy and was discharged home on postoperative day 1.  She reports some generalized tiredness but no focal deficits.  She actually looks quite good today.  Had been having severe back pain and this is somewhat better than it had been.  She does report the typical peri-incisional numbness around the incision  Current Outpatient Medications  Medication Sig Dispense Refill  . alendronate (FOSAMAX) 70 MG tablet Take 70 mg by mouth once a week. Take with a full glass of water on an empty stomach.  Sundays    . amLODipine (NORVASC) 5 MG tablet Take 5 mg by mouth daily.      . Ascorbic Acid (VITAMIN C) 1000 MG tablet Take 1,000 mg by mouth at bedtime.     Marland Kitchen aspirin 81 MG tablet Take 81 mg by mouth at bedtime.     Marland Kitchen atorvastatin (LIPITOR) 40 MG tablet Take 40 mg by mouth every evening.     . calcium carbonate (OS-CAL) 600 MG TABS Take 1,200 mg by mouth at bedtime.     . calcium carbonate (TUMS - DOSED IN MG ELEMENTAL CALCIUM) 500 MG chewable tablet Chew 1 tablet by mouth daily as needed for indigestion or heartburn.    . Cholecalciferol (D3-1000) 1000 units tablet Take 2,000 Units by mouth at bedtime.     . cyclobenzaprine (FLEXERIL) 10 MG tablet Take 10 mg by mouth 3 (three) times daily.    . diphenhydrAMINE (BENADRYL) 50 MG capsule Take 50 mg by mouth 1 hour prior to your procedure. 1 capsule 0  . enalapril (VASOTEC) 20 MG tablet Take 2 tablets (40 mg total) by mouth daily. 245 tablet 3  . folic acid (FOLVITE) 809 MCG tablet Take 800 mcg by mouth at bedtime.    . Garlic 9833 MG CAPS Take 1,000 mg by mouth at bedtime.     Marland Kitchen HYDROmorphone (DILAUDID) 2 MG tablet Take 1  mg by mouth every 4 (four) hours as needed for moderate pain or severe pain.     Marland Kitchen ibuprofen (ADVIL,MOTRIN) 200 MG tablet Take 400 mg by mouth every 4 (four) hours as needed for pain.     Marland Kitchen levothyroxine (SYNTHROID, LEVOTHROID) 112 MCG tablet Take 112 mcg by mouth daily before breakfast.     . LORazepam (ATIVAN) 0.5 MG tablet Take 0.5 mg by mouth 3 (three) times daily as needed for sleep.     . Methylcobalamin (B-12) 5000 MCG TBDP Take 5,000 mcg by mouth at bedtime.    . metoprolol succinate (TOPROL-XL) 25 MG 24 hr tablet Take 25 mg by mouth daily.    . montelukast (SINGULAIR) 10 MG tablet Take 10 mg by mouth daily.     . Multiple Vitamin (MULTIVITAMIN) capsule Take 1 capsule by mouth daily.     . Omega 3 1000 MG CAPS Take 1,000 mg by mouth at bedtime.     . Potassium 99 MG TABS Take 99 mg by mouth daily.    Marland Kitchen PROAIR HFA 108 (90 BASE) MCG/ACT inhaler Inhale 2 puffs into  the lungs every 6 (six) hours as needed for wheezing or shortness of breath.     . pyridOXINE (VITAMIN B-6) 100 MG tablet Take 100 mg by mouth daily.    . traMADol (ULTRAM) 50 MG tablet Take 1 tablet (50 mg total) by mouth every 6 (six) hours as needed. 60 tablet 1  . vitamin E 400 UNIT capsule Take 400 Units by mouth at bedtime.      No current facility-administered medications for this visit.      PHYSICAL EXAM: Vitals:   06/17/17 1320 06/17/17 1324  BP: 130/75 128/76  Pulse: 85   Resp: 20   Temp: 98 F (36.7 C)   TempSrc: Oral   SpO2: 96%   Weight: 111 lb (50.3 kg)   Height: 5\' 3"  (1.6 m)     GENERAL: The patient is a well-nourished female, in no acute distress. The vital signs are documented above. Left neck with a healing ridge.  No bruits bilaterally Grossly intact neurologically  MEDICAL ISSUES: Stable status post left carotid endarterectomy for severe asymptomatic disease.  She does have known moderate right carotid stenosis.  We will see her again in the office in 9 months for repeat carotid duplex.  She  will notify us of any wound issues or neurologic deficits   Rosetta Posner, MD [facility] Tel [phone] Pager [phone]

## 2017-07-04 DIAGNOSIS — M5126 Other intervertebral disc displacement, lumbar region: Secondary | ICD-10-CM | POA: Diagnosis not present

## 2017-07-04 DIAGNOSIS — S22000A Wedge compression fracture of unspecified thoracic vertebra, initial encounter for closed fracture: Secondary | ICD-10-CM | POA: Diagnosis not present

## 2017-07-07 ENCOUNTER — Telehealth: Payer: Self-pay | Admitting: Obstetrics and Gynecology

## 2017-07-07 DIAGNOSIS — M810 Age-related osteoporosis without current pathological fracture: Secondary | ICD-10-CM | POA: Insufficient documentation

## 2017-07-07 NOTE — Telephone Encounter (Signed)
Pt has been diagnosed with osteoporosis. Neurosurgeon wants to start on treatment. Pt would like to see Dr. Glo Herring to discuss this. Only available appt is on 5/30. Front desk put her on cancellation list and will call her if another appt. Opens sooner. Patient agreeable to this.

## 2017-07-11 DIAGNOSIS — M4854XA Collapsed vertebra, not elsewhere classified, thoracic region, initial encounter for fracture: Secondary | ICD-10-CM | POA: Diagnosis not present

## 2017-07-11 DIAGNOSIS — I700 Atherosclerosis of aorta: Secondary | ICD-10-CM | POA: Diagnosis not present

## 2017-07-11 DIAGNOSIS — S22080A Wedge compression fracture of T11-T12 vertebra, initial encounter for closed fracture: Secondary | ICD-10-CM | POA: Diagnosis not present

## 2017-07-11 DIAGNOSIS — M4856XD Collapsed vertebra, not elsewhere classified, lumbar region, subsequent encounter for fracture with routine healing: Secondary | ICD-10-CM | POA: Diagnosis not present

## 2017-07-11 DIAGNOSIS — M5126 Other intervertebral disc displacement, lumbar region: Secondary | ICD-10-CM | POA: Diagnosis not present

## 2017-07-15 DIAGNOSIS — M810 Age-related osteoporosis without current pathological fracture: Secondary | ICD-10-CM | POA: Diagnosis not present

## 2017-07-15 DIAGNOSIS — S22000A Wedge compression fracture of unspecified thoracic vertebra, initial encounter for closed fracture: Secondary | ICD-10-CM | POA: Diagnosis not present

## 2017-07-23 DIAGNOSIS — M810 Age-related osteoporosis without current pathological fracture: Secondary | ICD-10-CM | POA: Diagnosis not present

## 2017-07-24 ENCOUNTER — Encounter

## 2017-07-24 ENCOUNTER — Encounter: Payer: Medicare Other | Admitting: Obstetrics and Gynecology

## 2017-08-04 ENCOUNTER — Other Ambulatory Visit: Payer: Self-pay | Admitting: Obstetrics and Gynecology

## 2017-08-04 DIAGNOSIS — Z1231 Encounter for screening mammogram for malignant neoplasm of breast: Secondary | ICD-10-CM

## 2017-08-07 DIAGNOSIS — Z0001 Encounter for general adult medical examination with abnormal findings: Secondary | ICD-10-CM | POA: Diagnosis not present

## 2017-08-07 DIAGNOSIS — I739 Peripheral vascular disease, unspecified: Secondary | ICD-10-CM | POA: Diagnosis not present

## 2017-08-07 DIAGNOSIS — M810 Age-related osteoporosis without current pathological fracture: Secondary | ICD-10-CM | POA: Diagnosis not present

## 2017-08-07 DIAGNOSIS — I10 Essential (primary) hypertension: Secondary | ICD-10-CM | POA: Diagnosis not present

## 2017-08-11 ENCOUNTER — Ambulatory Visit (HOSPITAL_COMMUNITY)
Admission: RE | Admit: 2017-08-11 | Discharge: 2017-08-11 | Disposition: A | Discharge status: Home / Self Care | Payer: Medicare Other | Source: Ambulatory Visit | Attending: Obstetrics and Gynecology | Admitting: Obstetrics and Gynecology

## 2017-08-11 DIAGNOSIS — Z1231 Encounter for screening mammogram for malignant neoplasm of breast: Secondary | ICD-10-CM | POA: Diagnosis not present

## 2017-08-12 DIAGNOSIS — Z0001 Encounter for general adult medical examination with abnormal findings: Secondary | ICD-10-CM | POA: Diagnosis not present

## 2017-08-12 DIAGNOSIS — Z1389 Encounter for screening for other disorder: Secondary | ICD-10-CM | POA: Diagnosis not present

## 2017-08-12 DIAGNOSIS — Z0000 Encounter for general adult medical examination without abnormal findings: Secondary | ICD-10-CM | POA: Diagnosis not present

## 2017-08-13 DIAGNOSIS — M5126 Other intervertebral disc displacement, lumbar region: Secondary | ICD-10-CM | POA: Diagnosis not present

## 2017-08-13 DIAGNOSIS — M810 Age-related osteoporosis without current pathological fracture: Secondary | ICD-10-CM | POA: Diagnosis not present

## 2017-08-15 DIAGNOSIS — I739 Peripheral vascular disease, unspecified: Secondary | ICD-10-CM | POA: Diagnosis not present

## 2017-08-15 DIAGNOSIS — J449 Chronic obstructive pulmonary disease, unspecified: Secondary | ICD-10-CM | POA: Diagnosis not present

## 2017-08-15 DIAGNOSIS — K219 Gastro-esophageal reflux disease without esophagitis: Secondary | ICD-10-CM | POA: Diagnosis not present

## 2017-08-18 DIAGNOSIS — H40013 Open angle with borderline findings, low risk, bilateral: Secondary | ICD-10-CM | POA: Diagnosis not present

## 2017-09-04 DIAGNOSIS — I872 Venous insufficiency (chronic) (peripheral): Secondary | ICD-10-CM | POA: Diagnosis not present

## 2017-09-04 DIAGNOSIS — J449 Chronic obstructive pulmonary disease, unspecified: Secondary | ICD-10-CM | POA: Diagnosis not present

## 2017-09-04 DIAGNOSIS — R600 Localized edema: Secondary | ICD-10-CM | POA: Diagnosis not present

## 2017-09-04 DIAGNOSIS — I10 Essential (primary) hypertension: Secondary | ICD-10-CM | POA: Diagnosis not present

## 2017-09-10 DIAGNOSIS — M5126 Other intervertebral disc displacement, lumbar region: Secondary | ICD-10-CM | POA: Diagnosis not present

## 2017-09-10 DIAGNOSIS — M810 Age-related osteoporosis without current pathological fracture: Secondary | ICD-10-CM | POA: Diagnosis not present

## 2017-09-10 DIAGNOSIS — M461 Sacroiliitis, not elsewhere classified: Secondary | ICD-10-CM | POA: Diagnosis not present

## 2017-09-11 ENCOUNTER — Other Ambulatory Visit: Payer: Self-pay | Admitting: Neurosurgery

## 2017-09-11 DIAGNOSIS — M461 Sacroiliitis, not elsewhere classified: Secondary | ICD-10-CM

## 2017-09-24 ENCOUNTER — Other Ambulatory Visit: Payer: Medicare Other

## 2017-09-24 ENCOUNTER — Ambulatory Visit
Admission: RE | Admit: 2017-09-24 | Discharge: 2017-09-24 | Disposition: A | Discharge status: Home / Self Care | Payer: Medicare Other | Source: Ambulatory Visit | Attending: Neurosurgery | Admitting: Neurosurgery

## 2017-09-24 DIAGNOSIS — M461 Sacroiliitis, not elsewhere classified: Secondary | ICD-10-CM

## 2017-09-24 DIAGNOSIS — M533 Sacrococcygeal disorders, not elsewhere classified: Secondary | ICD-10-CM | POA: Diagnosis not present

## 2017-09-24 MED ORDER — METHYLPREDNISOLONE ACETATE 40 MG/ML INJ SUSP (RADIOLOG
120.0000 mg | Freq: Once | INTRAMUSCULAR | Status: AC
  Administered 2017-09-24: 120 mg via INTRA_ARTICULAR

## 2017-09-24 MED ORDER — IOPAMIDOL (ISOVUE-M 200) INJECTION 41%
1.0000 mL | Freq: Once | INTRAMUSCULAR | Status: AC
  Administered 2017-09-24: 1 mL via INTRA_ARTICULAR

## 2017-09-24 NOTE — Discharge Instructions (Signed)

## 2017-09-30 ENCOUNTER — Other Ambulatory Visit: Payer: Medicare Other

## 2017-10-03 ENCOUNTER — Other Ambulatory Visit: Payer: Medicare Other

## 2017-10-14 DIAGNOSIS — M810 Age-related osteoporosis without current pathological fracture: Secondary | ICD-10-CM | POA: Diagnosis not present

## 2017-10-14 DIAGNOSIS — S22000A Wedge compression fracture of unspecified thoracic vertebra, initial encounter for closed fracture: Secondary | ICD-10-CM | POA: Diagnosis not present

## 2017-10-14 DIAGNOSIS — M461 Sacroiliitis, not elsewhere classified: Secondary | ICD-10-CM | POA: Diagnosis not present

## 2017-10-22 DIAGNOSIS — R531 Weakness: Secondary | ICD-10-CM | POA: Diagnosis not present

## 2017-10-22 DIAGNOSIS — M461 Sacroiliitis, not elsewhere classified: Secondary | ICD-10-CM | POA: Diagnosis not present

## 2017-10-22 DIAGNOSIS — R262 Difficulty in walking, not elsewhere classified: Secondary | ICD-10-CM | POA: Diagnosis not present

## 2017-10-22 DIAGNOSIS — M545 Low back pain: Secondary | ICD-10-CM | POA: Diagnosis not present

## 2017-10-22 DIAGNOSIS — M25551 Pain in right hip: Secondary | ICD-10-CM | POA: Diagnosis not present

## 2017-10-30 DIAGNOSIS — R531 Weakness: Secondary | ICD-10-CM | POA: Diagnosis not present

## 2017-10-30 DIAGNOSIS — M461 Sacroiliitis, not elsewhere classified: Secondary | ICD-10-CM | POA: Diagnosis not present

## 2017-10-30 DIAGNOSIS — R262 Difficulty in walking, not elsewhere classified: Secondary | ICD-10-CM | POA: Diagnosis not present

## 2017-11-11 DIAGNOSIS — R531 Weakness: Secondary | ICD-10-CM | POA: Diagnosis not present

## 2017-11-11 DIAGNOSIS — R262 Difficulty in walking, not elsewhere classified: Secondary | ICD-10-CM | POA: Diagnosis not present

## 2017-11-11 DIAGNOSIS — M461 Sacroiliitis, not elsewhere classified: Secondary | ICD-10-CM | POA: Diagnosis not present

## 2017-11-21 DIAGNOSIS — M461 Sacroiliitis, not elsewhere classified: Secondary | ICD-10-CM | POA: Diagnosis not present

## 2017-11-21 DIAGNOSIS — R531 Weakness: Secondary | ICD-10-CM | POA: Diagnosis not present

## 2017-11-21 DIAGNOSIS — R262 Difficulty in walking, not elsewhere classified: Secondary | ICD-10-CM | POA: Diagnosis not present

## 2017-11-27 DIAGNOSIS — M461 Sacroiliitis, not elsewhere classified: Secondary | ICD-10-CM | POA: Diagnosis not present

## 2017-11-27 DIAGNOSIS — R262 Difficulty in walking, not elsewhere classified: Secondary | ICD-10-CM | POA: Diagnosis not present

## 2017-11-27 DIAGNOSIS — R531 Weakness: Secondary | ICD-10-CM | POA: Diagnosis not present

## 2018-01-05 DIAGNOSIS — Z1389 Encounter for screening for other disorder: Secondary | ICD-10-CM | POA: Diagnosis not present

## 2018-01-05 DIAGNOSIS — I10 Essential (primary) hypertension: Secondary | ICD-10-CM | POA: Diagnosis not present

## 2018-01-05 DIAGNOSIS — K047 Periapical abscess without sinus: Secondary | ICD-10-CM | POA: Diagnosis not present

## 2018-01-05 DIAGNOSIS — G4700 Insomnia, unspecified: Secondary | ICD-10-CM | POA: Diagnosis not present

## 2018-01-20 DIAGNOSIS — M810 Age-related osteoporosis without current pathological fracture: Secondary | ICD-10-CM | POA: Diagnosis not present

## 2018-01-20 DIAGNOSIS — M5126 Other intervertebral disc displacement, lumbar region: Secondary | ICD-10-CM | POA: Diagnosis not present

## 2018-01-20 DIAGNOSIS — S22000A Wedge compression fracture of unspecified thoracic vertebra, initial encounter for closed fracture: Secondary | ICD-10-CM | POA: Diagnosis not present

## 2018-02-13 DIAGNOSIS — H40013 Open angle with borderline findings, low risk, bilateral: Secondary | ICD-10-CM | POA: Diagnosis not present

## 2018-02-13 DIAGNOSIS — H524 Presbyopia: Secondary | ICD-10-CM | POA: Diagnosis not present

## 2018-02-13 DIAGNOSIS — H02401 Unspecified ptosis of right eyelid: Secondary | ICD-10-CM | POA: Diagnosis not present

## 2018-02-13 DIAGNOSIS — H04123 Dry eye syndrome of bilateral lacrimal glands: Secondary | ICD-10-CM | POA: Diagnosis not present

## 2018-02-23 DIAGNOSIS — H02831 Dermatochalasis of right upper eyelid: Secondary | ICD-10-CM | POA: Diagnosis not present

## 2018-02-23 DIAGNOSIS — H02413 Mechanical ptosis of bilateral eyelids: Secondary | ICD-10-CM | POA: Diagnosis not present

## 2018-02-23 DIAGNOSIS — E039 Hypothyroidism, unspecified: Secondary | ICD-10-CM | POA: Insufficient documentation

## 2018-02-23 DIAGNOSIS — H02423 Myogenic ptosis of bilateral eyelids: Secondary | ICD-10-CM | POA: Diagnosis not present

## 2018-02-23 DIAGNOSIS — H57813 Brow ptosis, bilateral: Secondary | ICD-10-CM | POA: Diagnosis not present

## 2018-02-23 DIAGNOSIS — H02834 Dermatochalasis of left upper eyelid: Secondary | ICD-10-CM | POA: Diagnosis not present

## 2018-03-05 DIAGNOSIS — M2550 Pain in unspecified joint: Secondary | ICD-10-CM | POA: Diagnosis not present

## 2018-03-05 DIAGNOSIS — I10 Essential (primary) hypertension: Secondary | ICD-10-CM | POA: Diagnosis not present

## 2018-03-05 DIAGNOSIS — I739 Peripheral vascular disease, unspecified: Secondary | ICD-10-CM | POA: Diagnosis not present

## 2018-03-05 DIAGNOSIS — J329 Chronic sinusitis, unspecified: Secondary | ICD-10-CM | POA: Diagnosis not present

## 2018-03-05 DIAGNOSIS — M810 Age-related osteoporosis without current pathological fracture: Secondary | ICD-10-CM | POA: Diagnosis not present

## 2018-03-23 ENCOUNTER — Other Ambulatory Visit: Payer: Self-pay

## 2018-03-23 DIAGNOSIS — L219 Seborrheic dermatitis, unspecified: Secondary | ICD-10-CM | POA: Diagnosis not present

## 2018-03-23 DIAGNOSIS — I6522 Occlusion and stenosis of left carotid artery: Secondary | ICD-10-CM

## 2018-03-23 DIAGNOSIS — D229 Melanocytic nevi, unspecified: Secondary | ICD-10-CM | POA: Diagnosis not present

## 2018-03-24 ENCOUNTER — Ambulatory Visit (HOSPITAL_COMMUNITY)
Admission: RE | Admit: 2018-03-24 | Discharge: 2018-03-24 | Disposition: A | Discharge status: Home / Self Care | Payer: Medicare Other | Source: Ambulatory Visit | Attending: Family | Admitting: Family

## 2018-03-24 ENCOUNTER — Ambulatory Visit (INDEPENDENT_AMBULATORY_CARE_PROVIDER_SITE_OTHER): Payer: Medicare Other | Admitting: Vascular Surgery

## 2018-03-24 ENCOUNTER — Encounter: Payer: Self-pay | Admitting: Vascular Surgery

## 2018-03-24 VITALS — BP 154/74 | HR 76 | Temp 98.1°F | Resp 20 | Ht 63.6 in | Wt 112.0 lb

## 2018-03-24 DIAGNOSIS — I6522 Occlusion and stenosis of left carotid artery: Secondary | ICD-10-CM | POA: Diagnosis not present

## 2018-03-24 NOTE — Progress Notes (Signed)
[facility] [place]  Patient name: Belinda Clark MRN: [id number] DOB: [birth year] Sex: female  REASON FOR VISIT: 50-month follow-up of left carotid endarterectomy  HPI: Belinda Clark is a 78 y.o. female here today for follow-up.  She had progressed to critical asymptomatic left carotid stenosis and underwent uneventful endarterectomy on 05/19/2017.  She has had no neurologic deficits.  Her main difficulty continues to be degenerative disc disease.  She is felt not to be a reoperative candidate due to osteoporosis.  Past Medical History:  Diagnosis Date  . Anxiety   . Arthritis   . Asthma   . Carotid artery stenosis    40-59 % -Left  . GERD (gastroesophageal reflux disease)   . Glaucoma   . High blood pressure   . HTN (hypertension)   . Hyperlipidemia   . Hypothyroidism   . PONV (postoperative nausea and vomiting)     Family History  Problem Relation Age of Onset  . Diabetes Mother   . Hypertension Mother   . Hyperlipidemia Mother   . Heart attack Father   . Heart disease Father   . Hyperlipidemia Father   . Heart disease Unknown   . Diabetes Unknown   . Stroke Brother   . Heart disease Brother   . Heart attack Brother   . Hyperlipidemia Brother   . Stroke Maternal Grandmother     SOCIAL HISTORY: Social History   Tobacco Use  . Smoking status: Former Smoker    Packs/day: 1.00    Years: 30.00    Pack years: 30.00    Types: Cigarettes    Last attempt to quit: 12/26/1993    Years since quitting: 24.2  . Smokeless tobacco: Never Used  Substance Use Topics  . Alcohol use: No    Allergies  Allergen Reactions  . Ciprofloxacin Hives  . Cleocin [Clindamycin Hcl] Hives  . Clindamycin Hcl Hives  . Iodinated Diagnostic Agents Hives    Iohexol  . Sulfa Antibiotics Hives  . Cefuroxime Axetil   . Clarithromycin   . Codeine   . Cymbalta [Duloxetine Hcl]   . Levofloxacin   . Metoclopramide Hcl   .  Penicillins     Current Outpatient Medications  Medication Sig Dispense Refill  . amLODipine (NORVASC) 5 MG tablet Take 5 mg by mouth daily.      . Ascorbic Acid (VITAMIN C) 1000 MG tablet Take 1,000 mg by mouth at bedtime.     Marland Kitchen aspirin 81 MG tablet Take 81 mg by mouth at bedtime.     Marland Kitchen atorvastatin (LIPITOR) 40 MG tablet Take 40 mg by mouth every evening.     . calcium carbonate (OS-CAL) 600 MG TABS Take 1,200 mg by mouth at bedtime.     . calcium carbonate (TUMS - DOSED IN MG ELEMENTAL CALCIUM) 500 MG chewable tablet Chew 1 tablet by mouth daily as needed for indigestion or heartburn.    . Cholecalciferol (D3-1000) 1000 units tablet Take 2,000 Units by mouth at bedtime.     . cyclobenzaprine (FLEXERIL) 10 MG tablet Take 10 mg by mouth 3 (three) times daily.    . enalapril (VASOTEC) 20 MG tablet Take 2 tablets (40 mg total) by mouth daily. 144 tablet 3  . folic acid (FOLVITE) 818 MCG tablet Take 800 mcg by mouth at bedtime.    . Garlic 5631 MG CAPS Take 1,000 mg by mouth at bedtime.     Marland Kitchen HYDROmorphone (DILAUDID) 2 MG tablet Take  1 mg by mouth every 4 (four) hours as needed for moderate pain or severe pain.     Marland Kitchen ibuprofen (ADVIL,MOTRIN) 200 MG tablet Take 400 mg by mouth every 4 (four) hours as needed for pain.     Marland Kitchen levothyroxine (SYNTHROID, LEVOTHROID) 112 MCG tablet Take 112 mcg by mouth daily before breakfast.     . LORazepam (ATIVAN) 0.5 MG tablet Take 0.5 mg by mouth 3 (three) times daily as needed for sleep.     . Methylcobalamin (B-12) 5000 MCG TBDP Take 5,000 mcg by mouth at bedtime.    . metoprolol succinate (TOPROL-XL) 25 MG 24 hr tablet Take 25 mg by mouth daily.    . montelukast (SINGULAIR) 10 MG tablet Take 10 mg by mouth daily.     . Multiple Vitamin (MULTIVITAMIN) capsule Take 1 capsule by mouth daily.     . Omega 3 1000 MG CAPS Take 1,000 mg by mouth at bedtime.     . Potassium 99 MG TABS Take 99 mg by mouth daily.    Marland Kitchen PROAIR HFA 108 (90 BASE) MCG/ACT inhaler Inhale 2  puffs into the lungs every 6 (six) hours as needed for wheezing or shortness of breath.     . pyridOXINE (VITAMIN B-6) 100 MG tablet Take 100 mg by mouth daily.    . traMADol (ULTRAM) 50 MG tablet Take 1 tablet (50 mg total) by mouth every 6 (six) hours as needed. (Patient not taking: Reported on 03/24/2018) 60 tablet 1  . vitamin E 400 UNIT capsule Take 400 Units by mouth at bedtime.      No current facility-administered medications for this visit.     REVIEW OF SYSTEMS:  [X]  denotes positive finding, [ ]  denotes negative finding Cardiac  Comments:  Chest pain or chest pressure:    Shortness of breath upon exertion:    Short of breath when lying flat:    Irregular heart rhythm:        Vascular    Pain in calf, thigh, or hip brought on by ambulation:    Pain in feet at night that wakes you up from your sleep:     Blood clot in your veins:    Leg swelling:           PHYSICAL EXAM: Vitals:   03/24/18 1000 03/24/18 1002  BP: (!) 181/82 (!) 154/74  Pulse: 76   Resp: 20   Temp: 98.1 F (36.7 C)   TempSrc: Oral   SpO2: 98%   Weight: 112 lb (50.8 kg)   Height: 5' 3.6" (1.615 m)     GENERAL: The patient is a well-nourished female, in no acute distress. The vital signs are documented above. CARDIOVASCULAR: Her left neck incision is completely healed.  She does have some peri-incisional numbness.  No carotid bruits bilaterally.  2+ radial pulses bilaterally PULMONARY: There is good air exchange  MUSCULOSKELETAL: There are no major deformities or cyanosis. NEUROLOGIC: No focal weakness or paresthesias are detected. SKIN: There are no ulcers or rashes noted. PSYCHIATRIC: The patient has a normal affect.  DATA:  Carotid duplex today shows widely patent endarterectomy with no evidence of recurrent stenosis and no significant right carotid disease  MEDICAL ISSUES: Stable 9 months following her endarterectomy.  We will see her again in 1 year for annual follow-up.  She will let us  know if she has any neurologic changes and if this is significant will present immediately to the emergency room    Arvilla Meres.  Early, MD FACS [facility] Tel [phone] Pager [phone]

## 2018-04-02 DIAGNOSIS — I2510 Atherosclerotic heart disease of native coronary artery without angina pectoris: Secondary | ICD-10-CM | POA: Diagnosis not present

## 2018-04-08 DIAGNOSIS — L309 Dermatitis, unspecified: Secondary | ICD-10-CM | POA: Diagnosis not present

## 2018-04-08 DIAGNOSIS — L603 Nail dystrophy: Secondary | ICD-10-CM | POA: Diagnosis not present

## 2018-04-08 DIAGNOSIS — L669 Cicatricial alopecia, unspecified: Secondary | ICD-10-CM | POA: Diagnosis not present

## 2018-04-10 DIAGNOSIS — I2510 Atherosclerotic heart disease of native coronary artery without angina pectoris: Secondary | ICD-10-CM | POA: Diagnosis not present

## 2018-04-23 DIAGNOSIS — M810 Age-related osteoporosis without current pathological fracture: Secondary | ICD-10-CM | POA: Diagnosis not present

## 2018-04-23 DIAGNOSIS — S22000A Wedge compression fracture of unspecified thoracic vertebra, initial encounter for closed fracture: Secondary | ICD-10-CM | POA: Diagnosis not present

## 2018-05-26 DIAGNOSIS — J449 Chronic obstructive pulmonary disease, unspecified: Secondary | ICD-10-CM | POA: Diagnosis not present

## 2018-05-26 DIAGNOSIS — Z1389 Encounter for screening for other disorder: Secondary | ICD-10-CM | POA: Diagnosis not present

## 2018-05-26 DIAGNOSIS — M1991 Primary osteoarthritis, unspecified site: Secondary | ICD-10-CM | POA: Diagnosis not present

## 2018-05-26 DIAGNOSIS — I10 Essential (primary) hypertension: Secondary | ICD-10-CM | POA: Diagnosis not present

## 2018-05-26 DIAGNOSIS — G894 Chronic pain syndrome: Secondary | ICD-10-CM | POA: Diagnosis not present

## 2018-05-26 DIAGNOSIS — Z0001 Encounter for general adult medical examination with abnormal findings: Secondary | ICD-10-CM | POA: Diagnosis not present

## 2018-05-26 IMAGING — MG 2D DIGITAL SCREENING BILATERAL MAMMOGRAM WITH CAD AND ADJUNCT TO
8 series · 9 of 24 positions shown · non-contrast
Comparison: Previous exam(s).

CLINICAL DATA: Screening.

EXAM:
2D DIGITAL SCREENING BILATERAL MAMMOGRAM WITH CAD AND ADJUNCT TOMO

[L MLO]
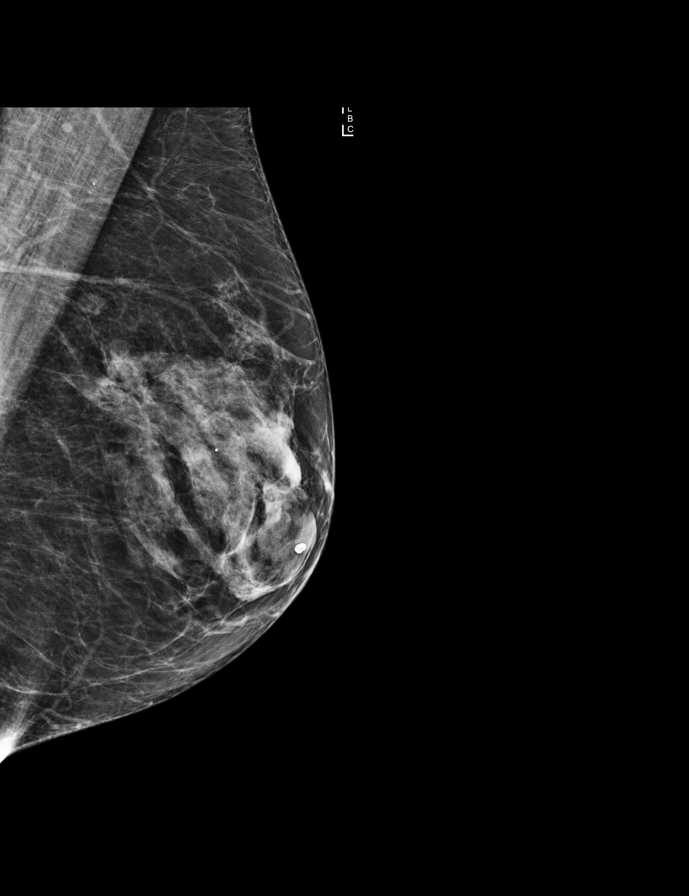

[R CC]
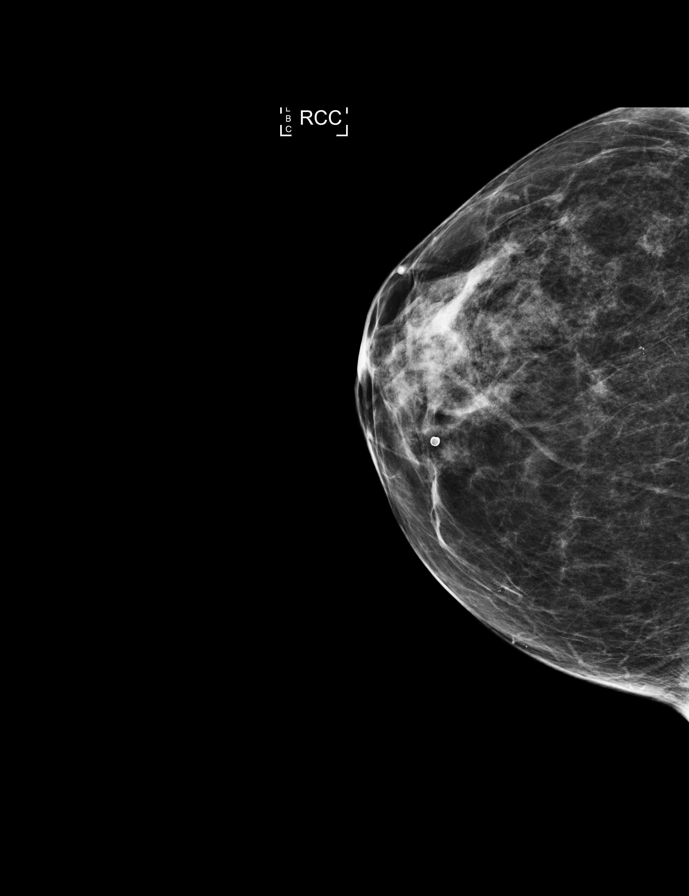

[R MLO]
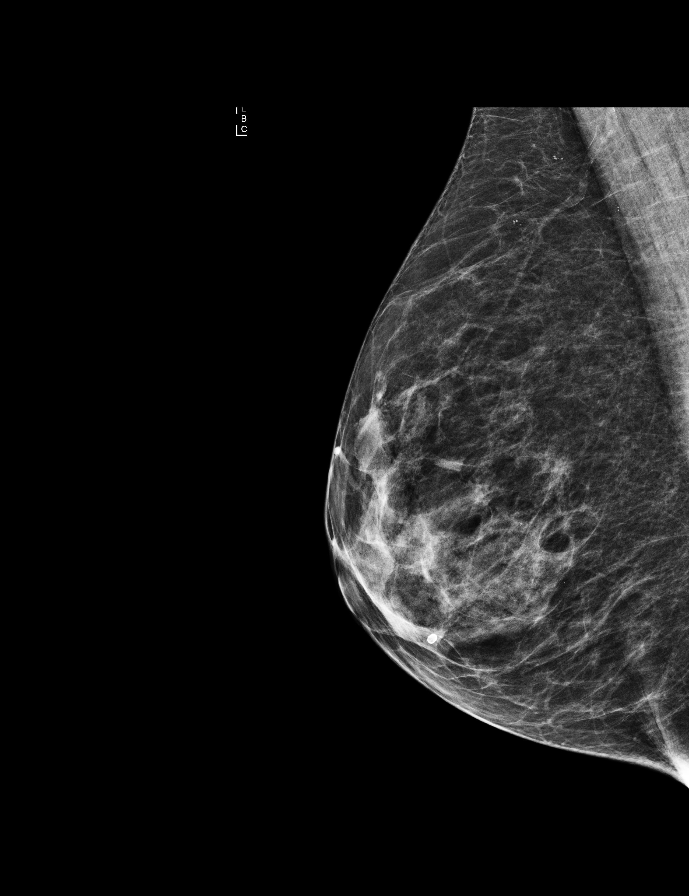

[L CC]
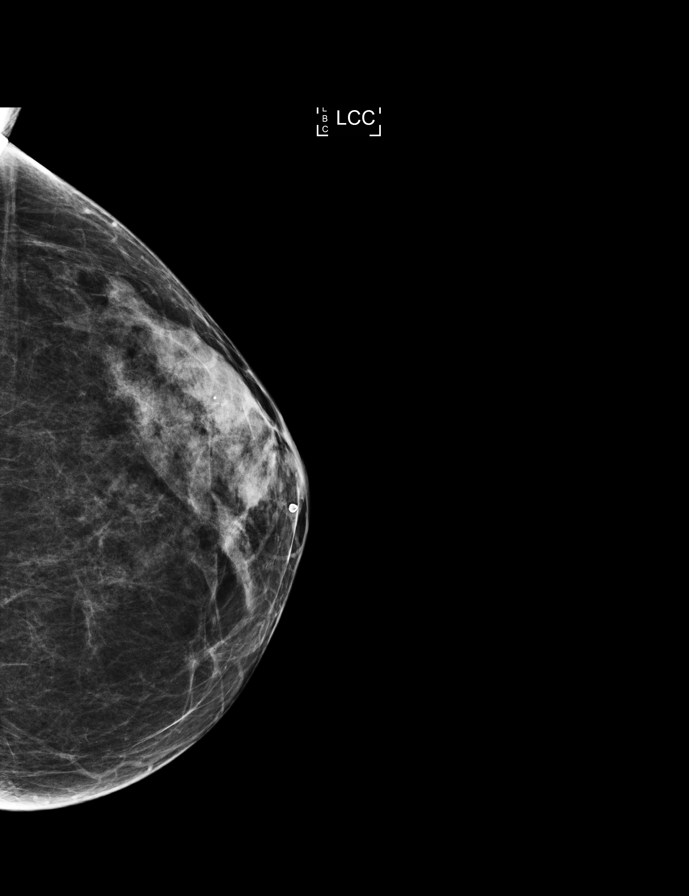

[L MLO tomo · 2 of 57 frames shown]
[frame 19/57]
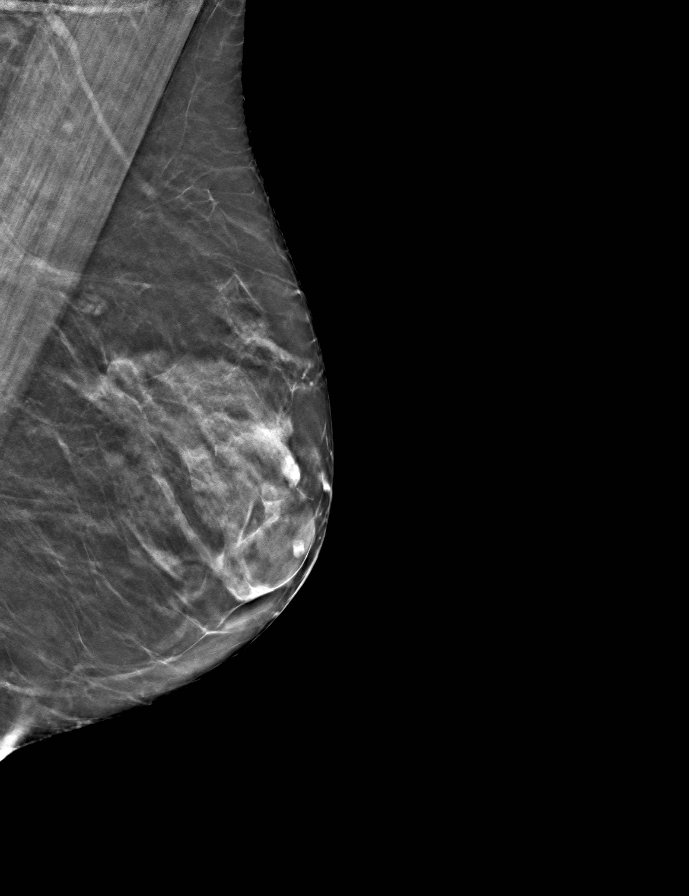
[frame 29/57]
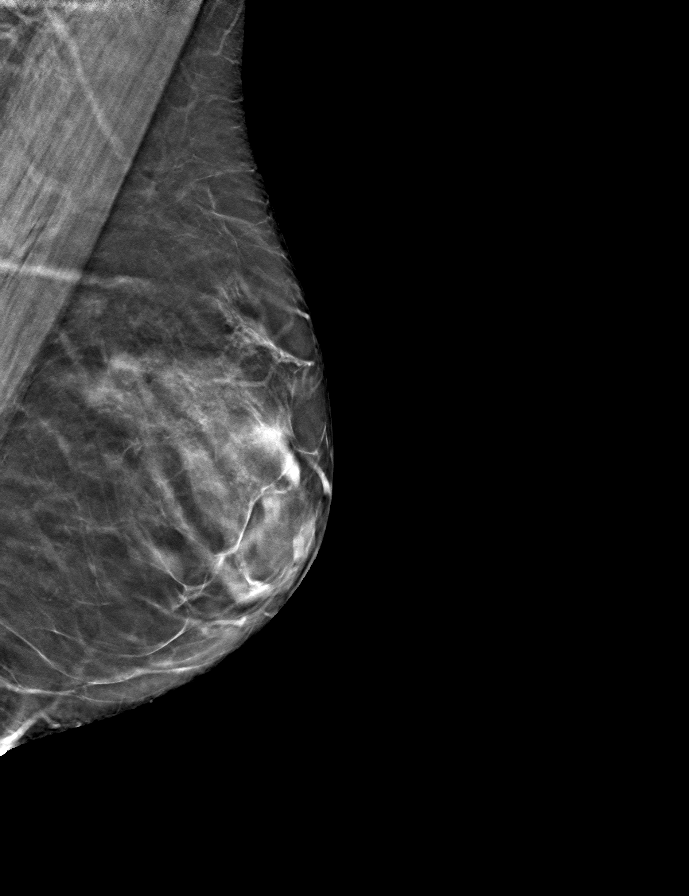

[R CC tomo · tomo slice 25/48.0]
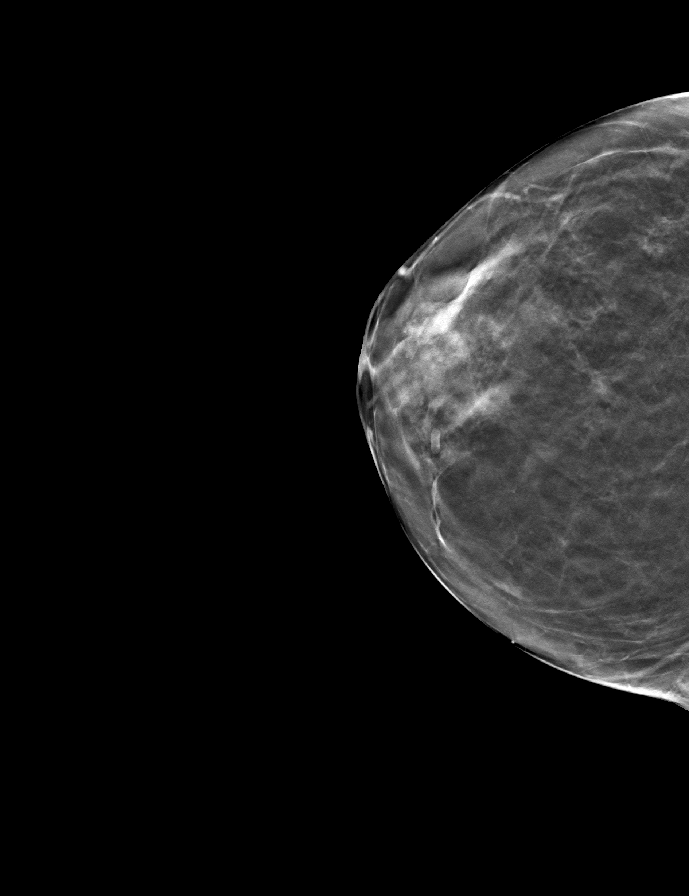

[L CC tomo · tomo slice 26/51.0]
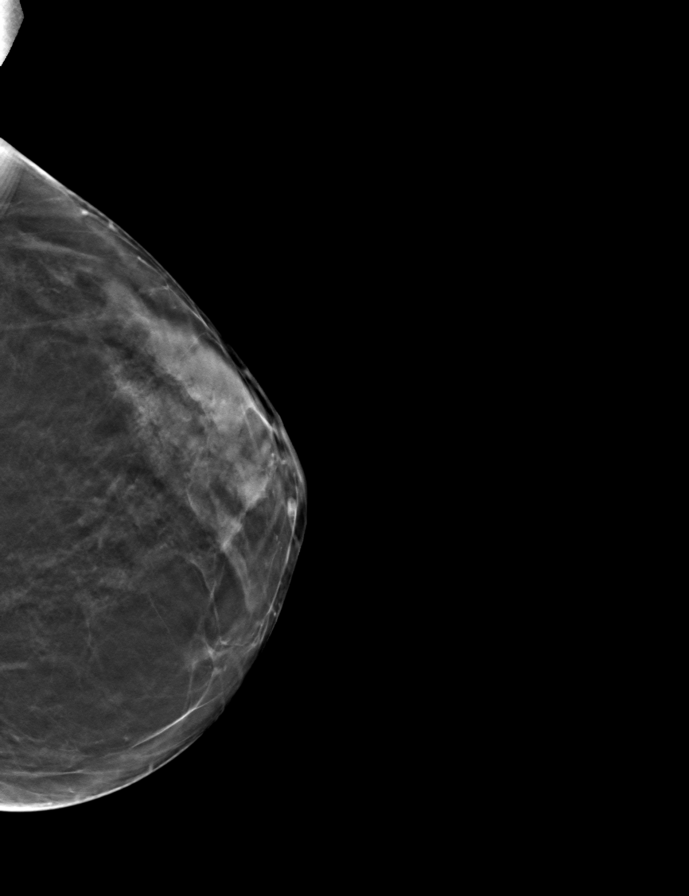

[R MLO tomo · tomo slice 24/47.0]
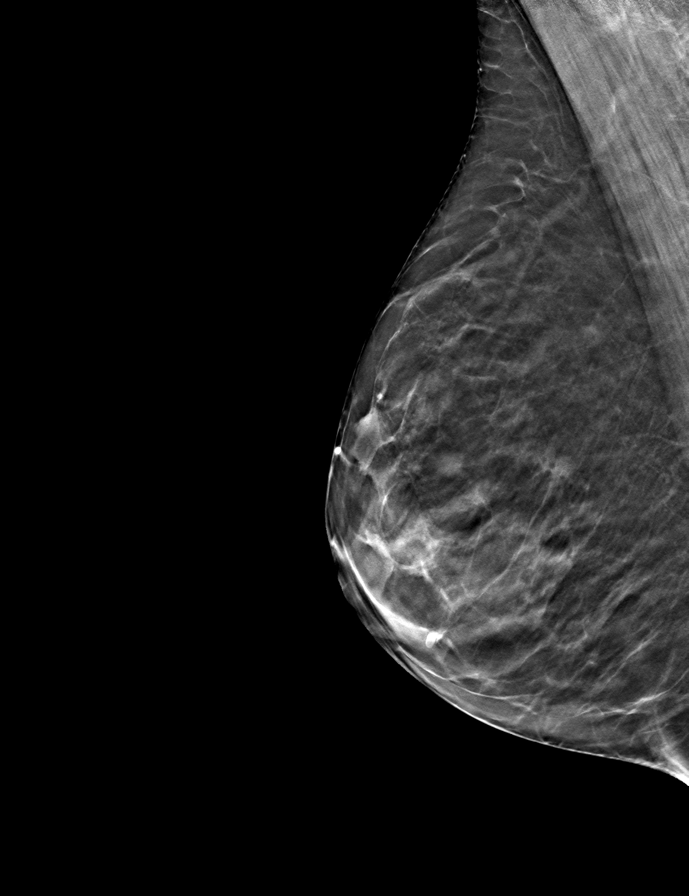

[9 of 24 positions shown; findings below may reference images not displayed]

ACR Breast Density Category c: The breast tissue is heterogeneously
dense, which may obscure small masses.
FINDINGS: There are no findings suspicious for malignancy. Images were
processed with CAD.
IMPRESSION: No mammographic evidence of malignancy. A result letter of this
screening mammogram will be mailed directly to the patient.

RECOMMENDATION:
Screening mammogram in one year. (Code:[id number]-[id number])

BI-RADS CATEGORY  1: Negative.

## 2018-05-27 DIAGNOSIS — Z1389 Encounter for screening for other disorder: Secondary | ICD-10-CM | POA: Diagnosis not present

## 2018-05-27 DIAGNOSIS — Z0001 Encounter for general adult medical examination with abnormal findings: Secondary | ICD-10-CM | POA: Diagnosis not present

## 2018-05-29 DIAGNOSIS — E063 Autoimmune thyroiditis: Secondary | ICD-10-CM | POA: Diagnosis not present

## 2018-05-29 DIAGNOSIS — I10 Essential (primary) hypertension: Secondary | ICD-10-CM | POA: Diagnosis not present

## 2018-05-29 DIAGNOSIS — M810 Age-related osteoporosis without current pathological fracture: Secondary | ICD-10-CM | POA: Diagnosis not present

## 2018-05-29 DIAGNOSIS — I739 Peripheral vascular disease, unspecified: Secondary | ICD-10-CM | POA: Diagnosis not present

## 2018-06-23 DIAGNOSIS — S22080A Wedge compression fracture of T11-T12 vertebra, initial encounter for closed fracture: Secondary | ICD-10-CM | POA: Diagnosis not present

## 2018-06-23 DIAGNOSIS — S32030A Wedge compression fracture of third lumbar vertebra, initial encounter for closed fracture: Secondary | ICD-10-CM | POA: Diagnosis not present

## 2018-06-23 DIAGNOSIS — M545 Low back pain: Secondary | ICD-10-CM | POA: Diagnosis not present

## 2018-06-23 DIAGNOSIS — M4856XA Collapsed vertebra, not elsewhere classified, lumbar region, initial encounter for fracture: Secondary | ICD-10-CM | POA: Insufficient documentation

## 2018-06-24 DIAGNOSIS — E039 Hypothyroidism, unspecified: Secondary | ICD-10-CM | POA: Diagnosis not present

## 2018-06-25 DIAGNOSIS — J449 Chronic obstructive pulmonary disease, unspecified: Secondary | ICD-10-CM | POA: Diagnosis not present

## 2018-06-25 DIAGNOSIS — E063 Autoimmune thyroiditis: Secondary | ICD-10-CM | POA: Diagnosis not present

## 2018-06-25 DIAGNOSIS — E538 Deficiency of other specified B group vitamins: Secondary | ICD-10-CM | POA: Diagnosis not present

## 2018-06-25 DIAGNOSIS — G894 Chronic pain syndrome: Secondary | ICD-10-CM | POA: Diagnosis not present

## 2018-06-25 DIAGNOSIS — I10 Essential (primary) hypertension: Secondary | ICD-10-CM | POA: Diagnosis not present

## 2018-07-07 DIAGNOSIS — K219 Gastro-esophageal reflux disease without esophagitis: Secondary | ICD-10-CM | POA: Diagnosis not present

## 2018-07-07 DIAGNOSIS — J329 Chronic sinusitis, unspecified: Secondary | ICD-10-CM | POA: Diagnosis not present

## 2018-07-07 DIAGNOSIS — J449 Chronic obstructive pulmonary disease, unspecified: Secondary | ICD-10-CM | POA: Diagnosis not present

## 2018-07-07 DIAGNOSIS — I10 Essential (primary) hypertension: Secondary | ICD-10-CM | POA: Diagnosis not present

## 2018-07-07 DIAGNOSIS — G4762 Sleep related leg cramps: Secondary | ICD-10-CM | POA: Diagnosis not present

## 2018-08-05 DIAGNOSIS — E039 Hypothyroidism, unspecified: Secondary | ICD-10-CM | POA: Diagnosis not present

## 2018-08-05 DIAGNOSIS — M810 Age-related osteoporosis without current pathological fracture: Secondary | ICD-10-CM | POA: Diagnosis not present

## 2018-08-05 DIAGNOSIS — M1991 Primary osteoarthritis, unspecified site: Secondary | ICD-10-CM | POA: Diagnosis not present

## 2018-08-05 DIAGNOSIS — I10 Essential (primary) hypertension: Secondary | ICD-10-CM | POA: Diagnosis not present

## 2018-08-06 DIAGNOSIS — M810 Age-related osteoporosis without current pathological fracture: Secondary | ICD-10-CM | POA: Diagnosis not present

## 2018-08-18 DIAGNOSIS — K0889 Other specified disorders of teeth and supporting structures: Secondary | ICD-10-CM | POA: Diagnosis not present

## 2018-09-07 DIAGNOSIS — J329 Chronic sinusitis, unspecified: Secondary | ICD-10-CM | POA: Diagnosis not present

## 2018-09-07 DIAGNOSIS — I10 Essential (primary) hypertension: Secondary | ICD-10-CM | POA: Diagnosis not present

## 2018-09-24 ENCOUNTER — Other Ambulatory Visit (HOSPITAL_COMMUNITY): Payer: Self-pay | Admitting: Obstetrics and Gynecology

## 2018-09-24 DIAGNOSIS — Z1231 Encounter for screening mammogram for malignant neoplasm of breast: Secondary | ICD-10-CM

## 2018-10-01 DIAGNOSIS — I10 Essential (primary) hypertension: Secondary | ICD-10-CM | POA: Diagnosis not present

## 2018-10-01 DIAGNOSIS — E063 Autoimmune thyroiditis: Secondary | ICD-10-CM | POA: Diagnosis not present

## 2018-10-13 DIAGNOSIS — H53483 Generalized contraction of visual field, bilateral: Secondary | ICD-10-CM | POA: Diagnosis not present

## 2018-10-29 DIAGNOSIS — L603 Nail dystrophy: Secondary | ICD-10-CM | POA: Diagnosis not present

## 2018-10-29 DIAGNOSIS — E063 Autoimmune thyroiditis: Secondary | ICD-10-CM | POA: Diagnosis not present

## 2018-10-29 DIAGNOSIS — M810 Age-related osteoporosis without current pathological fracture: Secondary | ICD-10-CM | POA: Diagnosis not present

## 2018-10-29 DIAGNOSIS — I10 Essential (primary) hypertension: Secondary | ICD-10-CM | POA: Diagnosis not present

## 2018-11-16 DIAGNOSIS — G894 Chronic pain syndrome: Secondary | ICD-10-CM | POA: Diagnosis not present

## 2018-12-10 DIAGNOSIS — T50905A Adverse effect of unspecified drugs, medicaments and biological substances, initial encounter: Secondary | ICD-10-CM | POA: Diagnosis not present

## 2018-12-10 DIAGNOSIS — I10 Essential (primary) hypertension: Secondary | ICD-10-CM | POA: Diagnosis not present

## 2019-01-25 ENCOUNTER — Ambulatory Visit (HOSPITAL_COMMUNITY)
Admission: RE | Admit: 2019-01-25 | Discharge: 2019-01-25 | Disposition: A | Discharge status: Home / Self Care | Payer: Medicare Other | Source: Ambulatory Visit | Attending: Obstetrics and Gynecology | Admitting: Obstetrics and Gynecology

## 2019-01-25 ENCOUNTER — Other Ambulatory Visit: Payer: Self-pay

## 2019-01-25 DIAGNOSIS — Z1231 Encounter for screening mammogram for malignant neoplasm of breast: Secondary | ICD-10-CM | POA: Diagnosis not present

## 2019-03-04 DIAGNOSIS — M1991 Primary osteoarthritis, unspecified site: Secondary | ICD-10-CM | POA: Diagnosis not present

## 2019-03-04 DIAGNOSIS — J449 Chronic obstructive pulmonary disease, unspecified: Secondary | ICD-10-CM | POA: Diagnosis not present

## 2019-03-04 DIAGNOSIS — I739 Peripheral vascular disease, unspecified: Secondary | ICD-10-CM | POA: Diagnosis not present

## 2019-03-04 DIAGNOSIS — I10 Essential (primary) hypertension: Secondary | ICD-10-CM | POA: Diagnosis not present

## 2019-03-12 DIAGNOSIS — Z8781 Personal history of (healed) traumatic fracture: Secondary | ICD-10-CM | POA: Diagnosis not present

## 2019-03-12 DIAGNOSIS — M47816 Spondylosis without myelopathy or radiculopathy, lumbar region: Secondary | ICD-10-CM | POA: Diagnosis not present

## 2019-03-15 DIAGNOSIS — E063 Autoimmune thyroiditis: Secondary | ICD-10-CM | POA: Diagnosis not present

## 2019-03-15 DIAGNOSIS — I10 Essential (primary) hypertension: Secondary | ICD-10-CM | POA: Diagnosis not present

## 2019-03-15 DIAGNOSIS — Z1389 Encounter for screening for other disorder: Secondary | ICD-10-CM | POA: Diagnosis not present

## 2019-03-15 DIAGNOSIS — J449 Chronic obstructive pulmonary disease, unspecified: Secondary | ICD-10-CM | POA: Diagnosis not present

## 2019-03-15 DIAGNOSIS — Z0001 Encounter for general adult medical examination with abnormal findings: Secondary | ICD-10-CM | POA: Diagnosis not present

## 2019-03-16 ENCOUNTER — Other Ambulatory Visit (HOSPITAL_COMMUNITY): Payer: Self-pay | Admitting: Internal Medicine

## 2019-03-16 DIAGNOSIS — E2839 Other primary ovarian failure: Secondary | ICD-10-CM

## 2019-03-18 DIAGNOSIS — M545 Low back pain: Secondary | ICD-10-CM | POA: Diagnosis not present

## 2019-03-29 DIAGNOSIS — L439 Lichen planus, unspecified: Secondary | ICD-10-CM | POA: Insufficient documentation

## 2019-03-29 DIAGNOSIS — L661 Lichen planopilaris: Secondary | ICD-10-CM | POA: Insufficient documentation

## 2019-03-29 DIAGNOSIS — L570 Actinic keratosis: Secondary | ICD-10-CM | POA: Diagnosis not present

## 2019-03-29 DIAGNOSIS — Z79899 Other long term (current) drug therapy: Secondary | ICD-10-CM | POA: Diagnosis not present

## 2019-03-31 DIAGNOSIS — M545 Low back pain, unspecified: Secondary | ICD-10-CM | POA: Insufficient documentation

## 2019-03-31 DIAGNOSIS — M4180 Other forms of scoliosis, site unspecified: Secondary | ICD-10-CM | POA: Insufficient documentation

## 2019-03-31 DIAGNOSIS — Z681 Body mass index (BMI) 19 or less, adult: Secondary | ICD-10-CM | POA: Insufficient documentation

## 2019-03-31 DIAGNOSIS — I10 Essential (primary) hypertension: Secondary | ICD-10-CM | POA: Diagnosis not present

## 2019-03-31 DIAGNOSIS — M4150 Other secondary scoliosis, site unspecified: Secondary | ICD-10-CM | POA: Insufficient documentation

## 2019-04-08 DIAGNOSIS — I2510 Atherosclerotic heart disease of native coronary artery without angina pectoris: Secondary | ICD-10-CM | POA: Diagnosis not present

## 2019-04-08 DIAGNOSIS — I739 Peripheral vascular disease, unspecified: Secondary | ICD-10-CM | POA: Diagnosis not present

## 2019-04-08 DIAGNOSIS — Z87891 Personal history of nicotine dependence: Secondary | ICD-10-CM | POA: Diagnosis not present

## 2019-04-13 ENCOUNTER — Ambulatory Visit: Payer: Medicare Other | Admitting: Vascular Surgery

## 2019-04-13 ENCOUNTER — Other Ambulatory Visit: Payer: Self-pay

## 2019-04-13 ENCOUNTER — Encounter: Payer: Self-pay | Admitting: *Deleted

## 2019-04-13 ENCOUNTER — Ambulatory Visit (HOSPITAL_COMMUNITY)
Admission: RE | Admit: 2019-04-13 | Discharge: 2019-04-13 | Disposition: A | Discharge status: Home / Self Care | Payer: Medicare Other | Source: Ambulatory Visit | Attending: Surgery | Admitting: Surgery

## 2019-04-13 ENCOUNTER — Encounter: Payer: Self-pay | Admitting: Vascular Surgery

## 2019-04-13 VITALS — BP 136/76 | HR 79 | Temp 97.3°F | Resp 20 | Ht 63.0 in | Wt 113.0 lb

## 2019-04-13 DIAGNOSIS — I6522 Occlusion and stenosis of left carotid artery: Secondary | ICD-10-CM

## 2019-04-13 NOTE — Progress Notes (Signed)
[facility] [place]  Patient name: Belinda Clark MRN: [id number] DOB: 06-01-1940 Sex: female  REASON FOR VISIT: Follow-up of carotid disease  HPI: Belinda Clark is a 79 y.o. female here today for follow-up.  Underwent left carotid endarterectomy for high-grade asymptomatic disease on March 2019.  She has had no neurologic deficits.  She does report that she had a elevated calcium score on a cardiac screen but had normal echocardiogram.  She is now on low-dose Xarelto and aspirin due to this.  She has had no issues of arrhythmia.  No amaurosis fugax, transient ischemic attack or stroke.  She does report that she has an upcoming blepharoplasty and is questioning whether there is any issue regarding her carotid disease and I explained that there would not be.  Past Medical History:  Diagnosis Date  . Anxiety   . Arthritis   . Asthma   . Carotid artery stenosis    40-59 % -Left  . GERD (gastroesophageal reflux disease)   . Glaucoma   . High blood pressure   . HTN (hypertension)   . Hyperlipidemia   . Hypothyroidism   . PONV (postoperative nausea and vomiting)     Family History  Problem Relation Age of Onset  . Diabetes Mother   . Hypertension Mother   . Hyperlipidemia Mother   . Heart attack Father   . Heart disease Father   . Hyperlipidemia Father   . Heart disease Other   . Diabetes Other   . Stroke Brother   . Heart disease Brother   . Heart attack Brother   . Hyperlipidemia Brother   . Stroke Maternal Grandmother     SOCIAL HISTORY: Social History   Tobacco Use  . Smoking status: Former Smoker    Packs/day: 1.00    Years: 30.00    Pack years: 30.00    Types: Cigarettes    Quit date: 12/26/1993    Years since quitting: 25.3  . Smokeless tobacco: Never Used  Substance Use Topics  . Alcohol use: No    Allergies  Allergen Reactions  . Ciprofloxacin Hives  . Cleocin  [Clindamycin] Hives  .  Cleocin [Clindamycin Hcl] Hives  . Clindamycin Hcl Hives  . Iodinated Diagnostic Agents Hives    Iohexol  . Sulfa Antibiotics Hives  . Ceftin  [Cefuroxime] Hives  . Cefuroxime Axetil   . Clarithromycin   . Codeine   . Cymbalta [Duloxetine Hcl]   . Levofloxacin   . Metoclopramide Hcl   . Penicillins     Current Outpatient Medications  Medication Sig Dispense Refill  . ALPRAZolam (XANAX XR) 0.5 MG 24 hr tablet Take 0.5 mg by mouth daily.    Marland Kitchen amLODipine (NORVASC) 5 MG tablet Take 5 mg by mouth daily.      . Ascorbic Acid (VITAMIN C) 1000 MG tablet Take 1,000 mg by mouth at bedtime.     Marland Kitchen aspirin 81 MG tablet Take 81 mg by mouth at bedtime.     Marland Kitchen atorvastatin (LIPITOR) 40 MG tablet Take 40 mg by mouth every evening.     . calcium carbonate (OS-CAL) 600 MG TABS Take 1,200 mg by mouth at bedtime.     . calcium carbonate (TUMS - DOSED IN MG ELEMENTAL CALCIUM) 500 MG chewable tablet Chew 1 tablet by mouth daily as needed for indigestion or heartburn.    . Cholecalciferol (D3-1000) 1000 units tablet Take 2,000 Units by mouth at bedtime.     Marland Kitchen  cyclobenzaprine (FLEXERIL) 10 MG tablet Take 10 mg by mouth 3 (three) times daily.    . enalapril (VASOTEC) 20 MG tablet Take 2 tablets (40 mg total) by mouth daily. 99991111 tablet 3  . folic acid (FOLVITE) Q000111Q MCG tablet Take 800 mcg by mouth at bedtime.    . Garlic 123XX123 MG CAPS Take 1,000 mg by mouth at bedtime.     Marland Kitchen HYDROmorphone (DILAUDID) 2 MG tablet Take 1 mg by mouth every 4 (four) hours as needed for moderate pain or severe pain.     Marland Kitchen ibuprofen (ADVIL,MOTRIN) 200 MG tablet Take 400 mg by mouth every 4 (four) hours as needed for pain.     Marland Kitchen levothyroxine (SYNTHROID, LEVOTHROID) 112 MCG tablet Take 112 mcg by mouth daily before breakfast.     . LORazepam (ATIVAN) 0.5 MG tablet Take 0.5 mg by mouth 3 (three) times daily as needed for sleep.     . Methylcobalamin (B-12) 5000 MCG TBDP Take 5,000 mcg by mouth at bedtime.    . metoprolol succinate  (TOPROL-XL) 25 MG 24 hr tablet Take 25 mg by mouth daily.    . montelukast (SINGULAIR) 10 MG tablet Take 10 mg by mouth daily.     . Multiple Vitamin (MULTIVITAMIN) capsule Take 1 capsule by mouth daily.     . Omega 3 1000 MG CAPS Take 1,000 mg by mouth at bedtime.     . Potassium 99 MG TABS Take 99 mg by mouth daily.    Marland Kitchen PROAIR HFA 108 (90 BASE) MCG/ACT inhaler Inhale 2 puffs into the lungs every 6 (six) hours as needed for wheezing or shortness of breath.     . pyridOXINE (VITAMIN B-6) 100 MG tablet Take 100 mg by mouth daily.    . rivaroxaban (XARELTO) 2.5 MG TABS tablet Take 2.5 mg by mouth 2 (two) times daily.    . traMADol (ULTRAM) 50 MG tablet Take 1 tablet (50 mg total) by mouth every 6 (six) hours as needed. 60 tablet 1  . vitamin E 400 UNIT capsule Take 400 Units by mouth at bedtime.      No current facility-administered medications for this visit.    REVIEW OF SYSTEMS:  [X]  denotes positive finding, [ ]  denotes negative finding Cardiac  Comments:  Chest pain or chest pressure:    Shortness of breath upon exertion:    Short of breath when lying flat:    Irregular heart rhythm:        Vascular    Pain in calf, thigh, or hip brought on by ambulation:    Pain in feet at night that wakes you up from your sleep:     Blood clot in your veins:    Leg swelling:           PHYSICAL EXAM: Vitals:   04/13/19 1540 04/13/19 1544  BP: (!) 142/78 136/76  Pulse: 79   Resp: 20   Temp: (!) 97.3 F (36.3 C)   SpO2: 98%   Weight: 113 lb (51.3 kg)   Height: 5\' 3"  (1.6 m)     GENERAL: The patient is a well-nourished female, in no acute distress. The vital signs are documented above. CARDIOVASCULAR: Carotid arteries without bruits bilaterally.  Well-healed left neck incision.  2+ radial pulses bilaterally PULMONARY: There is good air exchange  MUSCULOSKELETAL: There are no major deformities or cyanosis. NEUROLOGIC: No focal weakness or paresthesias are detected. SKIN: There are no  ulcers or rashes noted. PSYCHIATRIC: The patient has  a normal affect.  DATA:  Carotid duplex in our day office today reveals no evidence of carotid stenosis in her endarterectomy site or her right carotid  MEDICAL ISSUES: Stable nearly 2 years status post left carotid endarterectomy for severe asymptomatic disease.  We will continue full activity.  We will see her again in 1 year with repeat carotid duplex    Rosetta Posner, MD [facility] Tel [phone] Pager [phone]

## 2019-04-14 ENCOUNTER — Other Ambulatory Visit: Payer: Self-pay | Admitting: *Deleted

## 2019-04-14 DIAGNOSIS — I6522 Occlusion and stenosis of left carotid artery: Secondary | ICD-10-CM

## 2019-04-19 DIAGNOSIS — H02423 Myogenic ptosis of bilateral eyelids: Secondary | ICD-10-CM | POA: Diagnosis not present

## 2019-04-19 DIAGNOSIS — H02413 Mechanical ptosis of bilateral eyelids: Secondary | ICD-10-CM | POA: Diagnosis not present

## 2019-04-19 DIAGNOSIS — H02834 Dermatochalasis of left upper eyelid: Secondary | ICD-10-CM | POA: Diagnosis not present

## 2019-04-19 DIAGNOSIS — H02831 Dermatochalasis of right upper eyelid: Secondary | ICD-10-CM | POA: Diagnosis not present

## 2019-04-19 DIAGNOSIS — H57813 Brow ptosis, bilateral: Secondary | ICD-10-CM | POA: Diagnosis not present

## 2019-04-23 ENCOUNTER — Encounter (HOSPITAL_COMMUNITY): Admission: RE | Admit: 2019-04-23 | Payer: Medicare Other | Source: Ambulatory Visit

## 2019-04-27 ENCOUNTER — Ambulatory Visit: Payer: Medicare Other | Admitting: Vascular Surgery

## 2019-04-27 ENCOUNTER — Encounter (HOSPITAL_COMMUNITY): Payer: Medicare Other

## 2019-04-27 ENCOUNTER — Encounter (HOSPITAL_COMMUNITY): Admission: RE | Admit: 2019-04-27 | Payer: Medicare Other | Source: Ambulatory Visit

## 2019-04-28 ENCOUNTER — Other Ambulatory Visit: Payer: Self-pay

## 2019-04-28 ENCOUNTER — Encounter (HOSPITAL_COMMUNITY)
Admission: RE | Admit: 2019-04-28 | Discharge: 2019-04-28 | Disposition: A | Discharge status: Home / Self Care | Payer: Medicare Other | Source: Ambulatory Visit | Attending: Internal Medicine | Admitting: Internal Medicine

## 2019-04-28 ENCOUNTER — Encounter (HOSPITAL_COMMUNITY): Payer: Self-pay

## 2019-04-28 DIAGNOSIS — M810 Age-related osteoporosis without current pathological fracture: Secondary | ICD-10-CM | POA: Insufficient documentation

## 2019-04-28 LAB — POCT I-STAT, CHEM 8
BUN: 17 mg/dL (ref 8–23)
Calcium, Ion: 1.29 mmol/L (ref 1.15–1.40)
Chloride: 105 mmol/L (ref 98–111)
Creatinine, Ser: 0.6 mg/dL (ref 0.44–1.00)
Glucose, Bld: 79 mg/dL (ref 70–99)
HCT: 37 % (ref 36.0–46.0)
Hemoglobin: 12.6 g/dL (ref 12.0–15.0)
Potassium: 4.6 mmol/L (ref 3.5–5.1)
Sodium: 140 mmol/L (ref 135–145)
TCO2: 26 mmol/L (ref 22–32)

## 2019-04-28 MED ORDER — SODIUM CHLORIDE 0.9 % IV SOLN: Freq: Once | INTRAVENOUS | Status: AC

## 2019-04-28 MED ORDER — ZOLEDRONIC ACID 5 MG/100ML IV SOLN
5.0000 mg | Freq: Once | INTRAVENOUS | Status: AC
  Administered 2019-04-28: 5 mg via INTRAVENOUS

## 2019-05-20 DIAGNOSIS — I10 Essential (primary) hypertension: Secondary | ICD-10-CM | POA: Diagnosis not present

## 2019-05-20 DIAGNOSIS — M1991 Primary osteoarthritis, unspecified site: Secondary | ICD-10-CM | POA: Diagnosis not present

## 2019-05-24 DIAGNOSIS — H02831 Dermatochalasis of right upper eyelid: Secondary | ICD-10-CM | POA: Diagnosis not present

## 2019-05-24 DIAGNOSIS — H02423 Myogenic ptosis of bilateral eyelids: Secondary | ICD-10-CM | POA: Diagnosis not present

## 2019-05-24 DIAGNOSIS — H02834 Dermatochalasis of left upper eyelid: Secondary | ICD-10-CM | POA: Diagnosis not present

## 2019-06-04 DIAGNOSIS — Z09 Encounter for follow-up examination after completed treatment for conditions other than malignant neoplasm: Secondary | ICD-10-CM | POA: Diagnosis not present

## 2019-06-04 DIAGNOSIS — H5712 Ocular pain, left eye: Secondary | ICD-10-CM | POA: Diagnosis not present

## 2019-06-04 DIAGNOSIS — H5702 Anisocoria: Secondary | ICD-10-CM | POA: Diagnosis not present

## 2019-06-07 DIAGNOSIS — I10 Essential (primary) hypertension: Secondary | ICD-10-CM | POA: Diagnosis not present

## 2019-06-07 DIAGNOSIS — J449 Chronic obstructive pulmonary disease, unspecified: Secondary | ICD-10-CM | POA: Diagnosis not present

## 2019-06-07 DIAGNOSIS — H5702 Anisocoria: Secondary | ICD-10-CM | POA: Diagnosis not present

## 2019-06-11 DIAGNOSIS — I10 Essential (primary) hypertension: Secondary | ICD-10-CM | POA: Diagnosis not present

## 2019-06-11 DIAGNOSIS — K219 Gastro-esophageal reflux disease without esophagitis: Secondary | ICD-10-CM | POA: Diagnosis not present

## 2019-06-11 DIAGNOSIS — M5410 Radiculopathy, site unspecified: Secondary | ICD-10-CM | POA: Diagnosis not present

## 2019-06-14 DIAGNOSIS — H5702 Anisocoria: Secondary | ICD-10-CM | POA: Diagnosis not present

## 2019-07-02 DIAGNOSIS — M810 Age-related osteoporosis without current pathological fracture: Secondary | ICD-10-CM | POA: Diagnosis not present

## 2019-07-02 DIAGNOSIS — E7849 Other hyperlipidemia: Secondary | ICD-10-CM | POA: Diagnosis not present

## 2019-07-02 DIAGNOSIS — Z79899 Other long term (current) drug therapy: Secondary | ICD-10-CM | POA: Diagnosis not present

## 2019-07-02 DIAGNOSIS — I10 Essential (primary) hypertension: Secondary | ICD-10-CM | POA: Diagnosis not present

## 2019-07-06 DIAGNOSIS — M1991 Primary osteoarthritis, unspecified site: Secondary | ICD-10-CM | POA: Diagnosis not present

## 2019-07-06 DIAGNOSIS — E063 Autoimmune thyroiditis: Secondary | ICD-10-CM | POA: Diagnosis not present

## 2019-07-06 DIAGNOSIS — J449 Chronic obstructive pulmonary disease, unspecified: Secondary | ICD-10-CM | POA: Diagnosis not present

## 2019-07-21 DIAGNOSIS — T1511XA Foreign body in conjunctival sac, right eye, initial encounter: Secondary | ICD-10-CM | POA: Diagnosis not present

## 2019-07-28 DIAGNOSIS — G4700 Insomnia, unspecified: Secondary | ICD-10-CM | POA: Diagnosis not present

## 2019-07-28 DIAGNOSIS — J449 Chronic obstructive pulmonary disease, unspecified: Secondary | ICD-10-CM | POA: Diagnosis not present

## 2019-07-28 DIAGNOSIS — E063 Autoimmune thyroiditis: Secondary | ICD-10-CM | POA: Diagnosis not present

## 2019-07-28 DIAGNOSIS — T50905A Adverse effect of unspecified drugs, medicaments and biological substances, initial encounter: Secondary | ICD-10-CM | POA: Diagnosis not present

## 2019-07-28 DIAGNOSIS — J9801 Acute bronchospasm: Secondary | ICD-10-CM | POA: Diagnosis not present

## 2019-08-18 DIAGNOSIS — I10 Essential (primary) hypertension: Secondary | ICD-10-CM | POA: Diagnosis not present

## 2019-08-18 DIAGNOSIS — M62830 Muscle spasm of back: Secondary | ICD-10-CM | POA: Diagnosis not present

## 2019-08-18 DIAGNOSIS — M7061 Trochanteric bursitis, right hip: Secondary | ICD-10-CM | POA: Diagnosis not present

## 2019-08-18 DIAGNOSIS — T148XXA Other injury of unspecified body region, initial encounter: Secondary | ICD-10-CM | POA: Diagnosis not present

## 2019-09-08 DIAGNOSIS — H52203 Unspecified astigmatism, bilateral: Secondary | ICD-10-CM | POA: Diagnosis not present

## 2019-09-08 DIAGNOSIS — H40013 Open angle with borderline findings, low risk, bilateral: Secondary | ICD-10-CM | POA: Diagnosis not present

## 2019-09-08 DIAGNOSIS — H04123 Dry eye syndrome of bilateral lacrimal glands: Secondary | ICD-10-CM | POA: Diagnosis not present

## 2019-09-08 DIAGNOSIS — H524 Presbyopia: Secondary | ICD-10-CM | POA: Diagnosis not present

## 2019-10-25 DIAGNOSIS — H02421 Myogenic ptosis of right eyelid: Secondary | ICD-10-CM | POA: Diagnosis not present

## 2019-10-25 DIAGNOSIS — H02821 Cysts of right upper eyelid: Secondary | ICD-10-CM | POA: Diagnosis not present

## 2019-10-25 DIAGNOSIS — H02824 Cysts of left upper eyelid: Secondary | ICD-10-CM | POA: Diagnosis not present

## 2019-10-25 DIAGNOSIS — H5702 Anisocoria: Secondary | ICD-10-CM | POA: Diagnosis not present

## 2019-10-25 DIAGNOSIS — H57813 Brow ptosis, bilateral: Secondary | ICD-10-CM | POA: Diagnosis not present

## 2019-11-02 DIAGNOSIS — L720 Epidermal cyst: Secondary | ICD-10-CM | POA: Diagnosis not present

## 2019-11-02 DIAGNOSIS — H0011 Chalazion right upper eyelid: Secondary | ICD-10-CM | POA: Diagnosis not present

## 2019-11-02 DIAGNOSIS — H57813 Brow ptosis, bilateral: Secondary | ICD-10-CM | POA: Diagnosis not present

## 2019-11-02 DIAGNOSIS — H02824 Cysts of left upper eyelid: Secondary | ICD-10-CM | POA: Diagnosis not present

## 2019-11-02 DIAGNOSIS — H02421 Myogenic ptosis of right eyelid: Secondary | ICD-10-CM | POA: Diagnosis not present

## 2019-11-02 DIAGNOSIS — H5702 Anisocoria: Secondary | ICD-10-CM | POA: Diagnosis not present

## 2019-11-02 DIAGNOSIS — H02201 Unspecified lagophthalmos right upper eyelid: Secondary | ICD-10-CM | POA: Diagnosis not present

## 2019-11-02 DIAGNOSIS — D485 Neoplasm of uncertain behavior of skin: Secondary | ICD-10-CM | POA: Diagnosis not present

## 2019-11-29 ENCOUNTER — Other Ambulatory Visit (HOSPITAL_COMMUNITY): Payer: Self-pay | Admitting: Internal Medicine

## 2019-11-29 DIAGNOSIS — Z23 Encounter for immunization: Secondary | ICD-10-CM | POA: Diagnosis not present

## 2019-11-29 DIAGNOSIS — M47816 Spondylosis without myelopathy or radiculopathy, lumbar region: Secondary | ICD-10-CM | POA: Diagnosis not present

## 2019-11-29 DIAGNOSIS — J449 Chronic obstructive pulmonary disease, unspecified: Secondary | ICD-10-CM | POA: Diagnosis not present

## 2019-11-29 DIAGNOSIS — M25561 Pain in right knee: Secondary | ICD-10-CM

## 2019-11-29 DIAGNOSIS — I10 Essential (primary) hypertension: Secondary | ICD-10-CM | POA: Diagnosis not present

## 2019-11-29 DIAGNOSIS — K219 Gastro-esophageal reflux disease without esophagitis: Secondary | ICD-10-CM | POA: Diagnosis not present

## 2019-11-29 DIAGNOSIS — Z681 Body mass index (BMI) 19 or less, adult: Secondary | ICD-10-CM | POA: Diagnosis not present

## 2019-12-13 DIAGNOSIS — Z681 Body mass index (BMI) 19 or less, adult: Secondary | ICD-10-CM | POA: Diagnosis not present

## 2019-12-13 DIAGNOSIS — J449 Chronic obstructive pulmonary disease, unspecified: Secondary | ICD-10-CM | POA: Diagnosis not present

## 2019-12-13 DIAGNOSIS — E063 Autoimmune thyroiditis: Secondary | ICD-10-CM | POA: Diagnosis not present

## 2019-12-13 DIAGNOSIS — K219 Gastro-esophageal reflux disease without esophagitis: Secondary | ICD-10-CM | POA: Diagnosis not present

## 2019-12-13 DIAGNOSIS — J069 Acute upper respiratory infection, unspecified: Secondary | ICD-10-CM | POA: Diagnosis not present

## 2019-12-13 DIAGNOSIS — E039 Hypothyroidism, unspecified: Secondary | ICD-10-CM | POA: Diagnosis not present

## 2019-12-20 ENCOUNTER — Other Ambulatory Visit (HOSPITAL_COMMUNITY): Payer: Self-pay | Admitting: Internal Medicine

## 2019-12-20 DIAGNOSIS — M545 Low back pain, unspecified: Secondary | ICD-10-CM

## 2020-01-26 DIAGNOSIS — M5417 Radiculopathy, lumbosacral region: Secondary | ICD-10-CM | POA: Diagnosis not present

## 2020-01-26 DIAGNOSIS — M5136 Other intervertebral disc degeneration, lumbar region: Secondary | ICD-10-CM | POA: Diagnosis not present

## 2020-01-26 DIAGNOSIS — Z681 Body mass index (BMI) 19 or less, adult: Secondary | ICD-10-CM | POA: Diagnosis not present

## 2020-01-26 DIAGNOSIS — M47816 Spondylosis without myelopathy or radiculopathy, lumbar region: Secondary | ICD-10-CM | POA: Diagnosis not present

## 2020-01-26 DIAGNOSIS — I10 Essential (primary) hypertension: Secondary | ICD-10-CM | POA: Diagnosis not present

## 2020-02-04 DIAGNOSIS — S22080D Wedge compression fracture of T11-T12 vertebra, subsequent encounter for fracture with routine healing: Secondary | ICD-10-CM | POA: Diagnosis not present

## 2020-02-04 DIAGNOSIS — S32038D Other fracture of third lumbar vertebra, subsequent encounter for fracture with routine healing: Secondary | ICD-10-CM | POA: Diagnosis not present

## 2020-02-04 DIAGNOSIS — M5136 Other intervertebral disc degeneration, lumbar region: Secondary | ICD-10-CM | POA: Diagnosis not present

## 2020-02-04 DIAGNOSIS — M47816 Spondylosis without myelopathy or radiculopathy, lumbar region: Secondary | ICD-10-CM | POA: Diagnosis not present

## 2020-02-04 DIAGNOSIS — M5135 Other intervertebral disc degeneration, thoracolumbar region: Secondary | ICD-10-CM | POA: Diagnosis not present

## 2020-02-14 ENCOUNTER — Other Ambulatory Visit (HOSPITAL_COMMUNITY): Payer: Self-pay | Admitting: Internal Medicine

## 2020-02-14 DIAGNOSIS — Z1231 Encounter for screening mammogram for malignant neoplasm of breast: Secondary | ICD-10-CM

## 2020-02-23 ENCOUNTER — Other Ambulatory Visit: Payer: Self-pay | Admitting: Internal Medicine

## 2020-02-23 DIAGNOSIS — E2839 Other primary ovarian failure: Secondary | ICD-10-CM

## 2020-02-23 DIAGNOSIS — M810 Age-related osteoporosis without current pathological fracture: Secondary | ICD-10-CM | POA: Diagnosis not present

## 2020-02-23 DIAGNOSIS — Z681 Body mass index (BMI) 19 or less, adult: Secondary | ICD-10-CM | POA: Diagnosis not present

## 2020-02-23 DIAGNOSIS — G4700 Insomnia, unspecified: Secondary | ICD-10-CM | POA: Diagnosis not present

## 2020-02-23 DIAGNOSIS — I10 Essential (primary) hypertension: Secondary | ICD-10-CM | POA: Diagnosis not present

## 2020-02-23 DIAGNOSIS — G894 Chronic pain syndrome: Secondary | ICD-10-CM | POA: Diagnosis not present

## 2020-03-08 ENCOUNTER — Ambulatory Visit (HOSPITAL_COMMUNITY): Payer: Medicare Other

## 2020-03-16 ENCOUNTER — Other Ambulatory Visit: Payer: Self-pay

## 2020-03-16 ENCOUNTER — Ambulatory Visit (HOSPITAL_COMMUNITY)
Admission: RE | Admit: 2020-03-16 | Discharge: 2020-03-16 | Disposition: A | Discharge status: Home / Self Care | Payer: Medicare Other | Source: Ambulatory Visit | Attending: Internal Medicine | Admitting: Internal Medicine

## 2020-03-16 DIAGNOSIS — Z1231 Encounter for screening mammogram for malignant neoplasm of breast: Secondary | ICD-10-CM | POA: Diagnosis not present

## 2020-04-06 DIAGNOSIS — I2510 Atherosclerotic heart disease of native coronary artery without angina pectoris: Secondary | ICD-10-CM | POA: Diagnosis not present

## 2020-04-06 DIAGNOSIS — Z87891 Personal history of nicotine dependence: Secondary | ICD-10-CM | POA: Diagnosis not present

## 2020-04-17 ENCOUNTER — Other Ambulatory Visit (HOSPITAL_COMMUNITY): Payer: Self-pay | Admitting: Vascular Surgery

## 2020-04-17 ENCOUNTER — Other Ambulatory Visit: Payer: Self-pay

## 2020-04-17 ENCOUNTER — Ambulatory Visit: Payer: Medicare Other

## 2020-04-17 ENCOUNTER — Ambulatory Visit: Payer: Medicare Other | Admitting: Vascular Surgery

## 2020-04-17 ENCOUNTER — Encounter: Payer: Self-pay | Admitting: Vascular Surgery

## 2020-04-17 VITALS — BP 153/80 | HR 77 | Temp 99.3°F | Resp 14 | Ht 63.5 in | Wt 113.0 lb

## 2020-04-17 DIAGNOSIS — I6529 Occlusion and stenosis of unspecified carotid artery: Secondary | ICD-10-CM | POA: Diagnosis not present

## 2020-04-17 DIAGNOSIS — I6522 Occlusion and stenosis of left carotid artery: Secondary | ICD-10-CM | POA: Diagnosis not present

## 2020-04-17 NOTE — Progress Notes (Signed)
[facility]ist of [place]  Patient name: Belinda Clark MRN: [id number] DOB: 11/30/1940 Sex: female  REASON FOR VISIT: Follow-up carotid disease  HPI: Belinda Clark is a 80 y.o. female here today for follow-up.  She looks quite good today.  Her only complaint is of numbness from her right knee distally into her foot.  She reports numbness and occasional achy discomfort as well.  She has no neurologic deficits.  Past Medical History:  Diagnosis Date  . Anxiety   . Arthritis   . Asthma   . Carotid artery stenosis    40-59 % -Left  . GERD (gastroesophageal reflux disease)   . Glaucoma   . High blood pressure   . HTN (hypertension)   . Hyperlipidemia   . Hypothyroidism   . PONV (postoperative nausea and vomiting)     Family History  Problem Relation Age of Onset  . Diabetes Mother   . Hypertension Mother   . Hyperlipidemia Mother   . Heart attack Father   . Heart disease Father   . Hyperlipidemia Father   . Heart disease Other   . Diabetes Other   . Stroke Brother   . Heart disease Brother   . Heart attack Brother   . Hyperlipidemia Brother   . Stroke Maternal Grandmother     SOCIAL HISTORY: Social History   Tobacco Use  . Smoking status: Former Smoker    Packs/day: 1.00    Years: 30.00    Pack years: 30.00    Types: Cigarettes    Quit date: 12/26/1993    Years since quitting: 26.3  . Smokeless tobacco: Never Used  Substance Use Topics  . Alcohol use: No    Allergies  Allergen Reactions  . Ciprofloxacin Hives  . Cleocin  [Clindamycin] Hives  . Cleocin [Clindamycin Hcl] Hives  . Clindamycin Hcl Hives  . Iodinated Diagnostic Agents Hives    Iohexol  . Sulfa Antibiotics Hives  . Ceftin  [Cefuroxime] Hives  . Cefuroxime Axetil   . Clarithromycin   . Clindamycin Phosphate   . Codeine   . Cymbalta [Duloxetine Hcl]   . Doxycycline   . Iohexol      Desc: hives   . Levofloxacin   .  Metoclopramide Hcl   . Other   . Penicillin G   . Penicillins     Current Outpatient Medications  Medication Sig Dispense Refill  . ALPRAZolam (XANAX XR) 0.5 MG 24 hr tablet Take 0.5 mg by mouth daily.    Marland Kitchen amLODipine (NORVASC) 5 MG tablet Take 5 mg by mouth daily.    . Ascorbic Acid (VITAMIN C) 1000 MG tablet Take 1,000 mg by mouth at bedtime.    Marland Kitchen aspirin 81 MG tablet Take 81 mg by mouth at bedtime.    Marland Kitchen atorvastatin (LIPITOR) 40 MG tablet Take 40 mg by mouth every evening.    . calcium carbonate (OS-CAL) 600 MG TABS Take 1,200 mg by mouth at bedtime.     . calcium carbonate (TUMS - DOSED IN MG ELEMENTAL CALCIUM) 500 MG chewable tablet Chew 1 tablet by mouth daily as needed for indigestion or heartburn.    . Cholecalciferol 25 MCG (1000 UT) tablet Take 2,000 Units by mouth at bedtime.     . clopidogrel (PLAVIX) 75 MG tablet Take 75 mg by mouth daily.    . cyclobenzaprine (FLEXERIL) 10 MG tablet Take 10 mg by mouth 3 (three) times daily.    . enalapril (VASOTEC)  20 MG tablet Take 2 tablets (40 mg total) by mouth daily. 086 tablet 3  . folic acid (FOLVITE) 578 MCG tablet Take 800 mcg by mouth at bedtime.    . Garlic 4696 MG CAPS Take 1,000 mg by mouth at bedtime.    Marland Kitchen ibuprofen (ADVIL,MOTRIN) 200 MG tablet Take 400 mg by mouth every 4 (four) hours as needed for pain.     Marland Kitchen levothyroxine (SYNTHROID, LEVOTHROID) 112 MCG tablet Take 112 mcg by mouth daily before breakfast.     . LORazepam (ATIVAN) 0.5 MG tablet Take 0.5 mg by mouth 3 (three) times daily as needed for sleep.     . Methylcobalamin (B-12) 5000 MCG TBDP Take 5,000 mcg by mouth at bedtime.    . metoprolol succinate (TOPROL-XL) 25 MG 24 hr tablet Take 25 mg by mouth daily.    . montelukast (SINGULAIR) 10 MG tablet Take 10 mg by mouth daily.    . Multiple Vitamin (MULTIVITAMIN) capsule Take 1 capsule by mouth daily.    . Omega 3 1000 MG CAPS Take 1,000 mg by mouth at bedtime.    . Potassium 99 MG TABS Take 99 mg by mouth daily.     Marland Kitchen PROAIR HFA 108 (90 BASE) MCG/ACT inhaler Inhale 2 puffs into the lungs every 6 (six) hours as needed for wheezing or shortness of breath.     . pyridOXINE (VITAMIN B-6) 100 MG tablet Take 100 mg by mouth daily.    . traMADol (ULTRAM) 50 MG tablet Take 1 tablet (50 mg total) by mouth every 6 (six) hours as needed. 60 tablet 1  . vitamin E 400 UNIT capsule Take 400 Units by mouth at bedtime.    Marland Kitchen HYDROmorphone (DILAUDID) 2 MG tablet Take 1 mg by mouth every 4 (four) hours as needed for moderate pain or severe pain.  (Patient not taking: Reported on 04/17/2020)    . rivaroxaban (XARELTO) 2.5 MG TABS tablet Take 2.5 mg by mouth 2 (two) times daily. (Patient not taking: Reported on 04/17/2020)     No current facility-administered medications for this visit.    REVIEW OF SYSTEMS:  [X]  denotes positive finding, [ ]  denotes negative finding Cardiac  Comments:  Chest pain or chest pressure:    Shortness of breath upon exertion:    Short of breath when lying flat:    Irregular heart rhythm:        Vascular    Pain in calf, thigh, or hip brought on by ambulation:    Pain in feet at night that wakes you up from your sleep:     Blood clot in your veins:    Leg swelling:           PHYSICAL EXAM: Vitals:   04/17/20 1343 04/17/20 1348  BP: (!) 148/78 (!) 153/80  Pulse: 77 77  Resp: 14   Temp: 99.3 F (37.4 C)   TempSrc: Temporal   SpO2: 97%   Weight: 113 lb (51.3 kg)   Height: 5' 3.5" (1.613 m)     GENERAL: The patient is a well-nourished female, in no acute distress. The vital signs are documented above. CARDIOVASCULAR: Carotid arteries without bruits bilaterally.  Well-healed left carotid incision.  2+ posterior tibial pulses bilaterally PULMONARY: There is good air exchange  MUSCULOSKELETAL: There are no major deformities or cyanosis. NEUROLOGIC: No focal weakness or paresthesias are detected. SKIN: There are no ulcers or rashes noted. PSYCHIATRIC: The patient has a normal  affect.  DATA:  Carotid duplex  today reveals widely patent left endarterectomy and no stenosis in her right carotid artery  MEDICAL ISSUES: Stable status post left carotid endarterectomy for severe asymptomatic disease in March 2019.  She will continue full activities without limitation we will see her again in 1 year with repeat carotid duplex.  She is to see orthopedic surgery tomorrow and will mention her right knee and leg pain to them.  She has normal arterial flow    Rosetta Posner, MD FACS [facility] [place] Office Tel [phone]  Note: Portions of this report may have been transcribed using voice recognition software.  Every effort has been made to ensure accuracy; however, inadvertent computerized transcription errors may still be present.

## 2020-04-18 ENCOUNTER — Encounter: Payer: Self-pay | Admitting: Orthopaedic Surgery

## 2020-04-18 ENCOUNTER — Ambulatory Visit: Payer: Medicare Other | Admitting: Orthopaedic Surgery

## 2020-04-18 VITALS — BP 140/76 | HR 83 | Ht 63.5 in | Wt 113.0 lb

## 2020-04-18 DIAGNOSIS — M25551 Pain in right hip: Secondary | ICD-10-CM

## 2020-04-18 DIAGNOSIS — G8929 Other chronic pain: Secondary | ICD-10-CM

## 2020-04-18 DIAGNOSIS — M545 Low back pain, unspecified: Secondary | ICD-10-CM

## 2020-04-18 DIAGNOSIS — M25561 Pain in right knee: Secondary | ICD-10-CM

## 2020-04-18 NOTE — Progress Notes (Signed)
Subjective:    Patient ID: Belinda Clark, female    DOB: 04/19/1940, 80 y.o.   MRN: [id number]  HPI She has long history of lower back pain.  She was seen by me in 2018 for this.  She has had recurrence of pain in the lower back and right hip and right knee.  She had MRI done in December of 2021 at [facility].  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  She has more pain in the right knee with swelling at times, but no giving way. She has popping.  She has tried Tylenol and ice and heat with little relief.   Review of Systems  Constitutional: Positive for activity change.  Musculoskeletal: Positive for arthralgias, back pain, gait problem, joint swelling and myalgias.  All other systems reviewed and are negative.  For Review of Systems, all other systems reviewed and are negative.  The following is a summary of the past history medically, past history surgically, known current medicines, social history and family history.  This information is gathered electronically by the computer from prior information and documentation.  I review this each visit and have found including this information at this point in the chart is beneficial and informative.   Past Medical History:  Diagnosis Date  . Anxiety   . Arthritis   . Asthma   . Carotid artery stenosis    40-59 % -Left  . GERD (gastroesophageal reflux disease)   . Glaucoma   . High blood pressure   . HTN (hypertension)   . Hyperlipidemia   . Hypothyroidism   . PONV (postoperative nausea and vomiting)     Past Surgical History:  Procedure Laterality Date  . BREAST BIOPSY     x3  . CATARACT EXTRACTION W/ INTRAOCULAR LENS  IMPLANT, BILATERAL    . COLONOSCOPY N/A 10/05/2013   Procedure: COLONOSCOPY;  Surgeon: Jamesetta So, MD;  Location: [facility];  Service: Gastroenterology;  Laterality: N/A;  .  ENDARTERECTOMY Left 05/19/2017   Procedure: ENDARTERECTOMY CAROTID LEFT;  Surgeon: Rosetta Posner, MD;  Location: [place];  Service: Vascular;  Laterality: Left;  . FRACTURE SURGERY     Spinal Fx 06/30/11  . NECK MASS EXCISION    . PATCH ANGIOPLASTY Left 05/19/2017   Procedure: PATCH ANGIOPLASTY LEFT CAROTID ARTERY USING HEMASHIELD PLATINUM FINESSE PATCH;  Surgeon: Rosetta Posner, MD;  Location: [place];  Service: Vascular;  Laterality: Left;  . RHINOPLASTY     3 total surgeries  . SPINE SURGERY  06/30/2011   pt states surgery was 2002   . THYROIDECTOMY    . TONSILLECTOMY      Current Outpatient Medications on File Prior to Visit  Medication Sig Dispense Refill  . ALPRAZolam (XANAX XR) 0.5 MG 24 hr tablet Take 0.5 mg by mouth daily.    Marland Kitchen amLODipine (NORVASC) 5 MG tablet Take 5 mg by mouth daily.    . Ascorbic Acid (VITAMIN C) 1000 MG tablet Take 1,000 mg by mouth at bedtime.    Marland Kitchen aspirin 81 MG tablet Take 81 mg by mouth at bedtime.    Marland Kitchen atorvastatin (LIPITOR) 40 MG tablet Take 40 mg by mouth every evening.    . calcium carbonate (OS-CAL) 600 MG TABS Take 1,200 mg by mouth at bedtime.     . calcium carbonate (TUMS - DOSED IN MG ELEMENTAL CALCIUM) 500 MG  chewable tablet Chew 1 tablet by mouth daily as needed for indigestion or heartburn. (Patient not taking: Reported on 04/18/2020)    . Cholecalciferol 25 MCG (1000 UT) tablet Take 2,000 Units by mouth at bedtime.     . clopidogrel (PLAVIX) 75 MG tablet Take 75 mg by mouth daily.    . cyclobenzaprine (FLEXERIL) 10 MG tablet Take 10 mg by mouth 3 (three) times daily. (Patient not taking: Reported on 04/18/2020)    . enalapril (VASOTEC) 20 MG tablet Take 2 tablets (40 mg total) by mouth daily. 010 tablet 3  . folic acid (FOLVITE) 272 MCG tablet Take 800 mcg by mouth at bedtime.    . Garlic 5366 MG CAPS Take 1,000 mg by mouth at bedtime.    Marland Kitchen HYDROmorphone (DILAUDID) 2 MG tablet Take 1 mg by mouth every 4 (four) hours as needed for moderate pain or  severe pain.  (Patient not taking: No sig reported)    . ibuprofen (ADVIL,MOTRIN) 200 MG tablet Take 400 mg by mouth every 4 (four) hours as needed for pain.     Marland Kitchen levothyroxine (SYNTHROID) 125 MCG tablet Take 125 mcg by mouth daily.    Marland Kitchen LORazepam (ATIVAN) 0.5 MG tablet Take 0.5 mg by mouth 3 (three) times daily as needed for sleep.     . Methylcobalamin (B-12) 5000 MCG TBDP Take 5,000 mcg by mouth at bedtime.    . metoprolol succinate (TOPROL-XL) 25 MG 24 hr tablet Take 25 mg by mouth daily.    . montelukast (SINGULAIR) 10 MG tablet Take 10 mg by mouth daily.    . Multiple Vitamin (MULTIVITAMIN) capsule Take 1 capsule by mouth daily.    . Omega 3 1000 MG CAPS Take 1,000 mg by mouth at bedtime.    . Potassium 99 MG TABS Take 99 mg by mouth daily.    Marland Kitchen PROAIR HFA 108 (90 BASE) MCG/ACT inhaler Inhale 2 puffs into the lungs every 6 (six) hours as needed for wheezing or shortness of breath.     . pyridOXINE (VITAMIN B-6) 100 MG tablet Take 100 mg by mouth daily.    . rivaroxaban (XARELTO) 2.5 MG TABS tablet Take 2.5 mg by mouth 2 (two) times daily. (Patient not taking: Reported on 04/17/2020)    . traMADol (ULTRAM) 50 MG tablet Take 1 tablet (50 mg total) by mouth every 6 (six) hours as needed. 60 tablet 1  . vitamin E 400 UNIT capsule Take 400 Units by mouth at bedtime.     No current facility-administered medications on file prior to visit.    Social History   Socioeconomic History  . Marital status: Widowed    Spouse name: Not on file  . Number of children: Not on file  . Years of education: Not on file  . Highest education level: Not on file  Occupational History  . Not on file  Tobacco Use  . Smoking status: Former Smoker    Packs/day: 1.00    Years: 30.00    Pack years: 30.00    Types: Cigarettes    Quit date: 12/26/1993    Years since quitting: 26.3  . Smokeless tobacco: Never Used  Vaping Use  . Vaping Use: Never used  Substance and Sexual Activity  . Alcohol use: No  .  Drug use: No  . Sexual activity: Not on file  Other Topics Concern  . Not on file  Social History Narrative  . Not on file   Social Determinants of Health  Financial Resource Strain: Not on file  Food Insecurity: Not on file  Transportation Needs: Not on file  Physical Activity: Not on file  Stress: Not on file  Social Connections: Not on file  Intimate Partner Violence: Not on file    Family History  Problem Relation Age of Onset  . Diabetes Mother   . Hypertension Mother   . Hyperlipidemia Mother   . Heart attack Father   . Heart disease Father   . Hyperlipidemia Father   . Heart disease Other   . Diabetes Other   . Stroke Brother   . Heart disease Brother   . Heart attack Brother   . Hyperlipidemia Brother   . Stroke Maternal Grandmother     BP 140/76   Pulse 83   Ht 5' 3.5" (1.613 m)   Wt 113 lb (51.3 kg)   BMI 19.70 kg/m   Body mass index is 19.7 kg/m.      Objective:   Physical Exam Vitals and nursing note reviewed. Exam conducted with a chaperone present.  Constitutional:      Appearance: She is well-developed and well-nourished.  HENT:     Head: Normocephalic and atraumatic.  Eyes:     Extraocular Movements: EOM normal.     Conjunctiva/sclera: Conjunctivae normal.     Pupils: Pupils are equal, round, and reactive to light.  Cardiovascular:     Rate and Rhythm: Normal rate and regular rhythm.     Pulses: Intact distal pulses.  Pulmonary:     Effort: Pulmonary effort is normal.  Abdominal:     Palpations: Abdomen is soft.  Musculoskeletal:     Cervical back: Normal range of motion and neck supple.       Legs:  Skin:    General: Skin is warm and dry.  Neurological:     Mental Status: She is alert and oriented to person, place, and time.     Cranial Nerves: No cranial nerve deficit.     Motor: No abnormal muscle tone.     Coordination: Coordination normal.     Deep Tendon Reflexes: Reflexes are normal and symmetric. Reflexes normal.   Psychiatric:        Mood and Affect: Mood and affect normal.        Behavior: Behavior normal.        Thought Content: Thought content normal.        Judgment: Judgment normal.           Assessment & Plan:   Encounter Diagnoses  Name Primary?  . Chronic pain of right knee Yes  . Chronic right hip pain   . Lumbar pain    PROCEDURE NOTE:  The patient requests injections of the right knee , verbal consent was obtained.  The right knee was prepped appropriately after time out was performed.   Sterile technique was observed and injection of 1 cc of Celestone 6 mg with several cc's of plain xylocaine. Anesthesia was provided by ethyl chloride and a 20-gauge needle was used to inject the knee area. The injection was tolerated well.  A band aid dressing was applied.  The patient was advised to apply ice later today and tomorrow to the injection sight as needed.  Begin Aleve one po bid for several days and if tolerated well, increase to two po bid after eating.  Return in two weeks.  She may need X-rays of the right hip then.  Call if any problem.  Precautions discussed.  Electronically Signed Sanjuana Kava, MD 2/22/202210:50 AM

## 2020-04-27 ENCOUNTER — Encounter (HOSPITAL_COMMUNITY): Payer: Medicare Other

## 2020-05-02 ENCOUNTER — Ambulatory Visit: Payer: Medicare Other | Admitting: Orthopaedic Surgery

## 2020-05-02 ENCOUNTER — Other Ambulatory Visit: Payer: Self-pay

## 2020-05-02 ENCOUNTER — Encounter: Payer: Self-pay | Admitting: Orthopaedic Surgery

## 2020-05-02 VITALS — BP 147/77 | HR 76 | Ht 63.5 in | Wt 113.0 lb

## 2020-05-02 DIAGNOSIS — M25561 Pain in right knee: Secondary | ICD-10-CM | POA: Diagnosis not present

## 2020-05-02 DIAGNOSIS — G8929 Other chronic pain: Secondary | ICD-10-CM | POA: Diagnosis not present

## 2020-05-02 DIAGNOSIS — M545 Low back pain, unspecified: Secondary | ICD-10-CM | POA: Diagnosis not present

## 2020-05-02 NOTE — Progress Notes (Signed)
Patient ER:[id number] Belinda Clark, female DOB:10-Jan-1941, 80 y.o. QPY:[id number]  Chief Complaint  Patient presents with  . Back Pain    HPI  Belinda Clark is a 80 y.o. female who has right knee pain which is much improved after the injection last time.  She said it helped greatly.    She has numbness of the right leg and pain that runs down the right buttocks posteriorly, posterior to lateral thigh, past the knee and to the right foot making the right foot numb at times.  She had back surgery in 2002 by Dr. Joya Salm.  She had MRI at [facility] in November.  Report is attached to this note.  I am concerned her pain is related to her lower back and perhaps she has scar tissue. Her MRI was without contrast.  She may need new one with contrast.  I will let the neurosurgeon see her and make assessment.  Patient agrees.   Body mass index is 19.7 kg/m.  ROS  Review of Systems  Constitutional: Positive for activity change.  Musculoskeletal: Positive for arthralgias, back pain, gait problem, joint swelling and myalgias.  All other systems reviewed and are negative.   All other systems reviewed and are negative.  The following is a summary of the past history medically, past history surgically, known current medicines, social history and family history.  This information is gathered electronically by the computer from prior information and documentation.  I review this each visit and have found including this information at this point in the chart is beneficial and informative.    Past Medical History:  Diagnosis Date  . Anxiety   . Arthritis   . Asthma   . Carotid artery stenosis    40-59 % -Left  . GERD (gastroesophageal reflux disease)   . Glaucoma   . High blood pressure   . HTN (hypertension)   . Hyperlipidemia   . Hypothyroidism   . PONV (postoperative nausea and vomiting)     Past Surgical History:  Procedure Laterality Date  . BREAST BIOPSY     x3  . CATARACT EXTRACTION W/  INTRAOCULAR LENS  IMPLANT, BILATERAL    . COLONOSCOPY N/A 10/05/2013   Procedure: COLONOSCOPY;  Surgeon: Jamesetta So, MD;  Location: [facility];  Service: Gastroenterology;  Laterality: N/A;  . ENDARTERECTOMY Left 05/19/2017   Procedure: ENDARTERECTOMY CAROTID LEFT;  Surgeon: Rosetta Posner, MD;  Location: [place];  Service: Vascular;  Laterality: Left;  . FRACTURE SURGERY     Spinal Fx 06/30/11  . NECK MASS EXCISION    . PATCH ANGIOPLASTY Left 05/19/2017   Procedure: PATCH ANGIOPLASTY LEFT CAROTID ARTERY USING HEMASHIELD PLATINUM FINESSE PATCH;  Surgeon: Rosetta Posner, MD;  Location: [place];  Service: Vascular;  Laterality: Left;  . RHINOPLASTY     3 total surgeries  . SPINE SURGERY  06/30/2011   pt states surgery was 2002   . THYROIDECTOMY    . TONSILLECTOMY      Family History  Problem Relation Age of Onset  . Diabetes Mother   . Hypertension Mother   . Hyperlipidemia Mother   . Heart attack Father   . Heart disease Father   . Hyperlipidemia Father   . Heart disease Other   . Diabetes Other   . Stroke Brother   . Heart disease Brother   . Heart attack Brother   . Hyperlipidemia Brother   . Stroke Maternal Grandmother     Social History Social History  Tobacco Use  . Smoking status: Former Smoker    Packs/day: 1.00    Years: 30.00    Pack years: 30.00    Types: Cigarettes    Quit date: 12/26/1993    Years since quitting: 26.3  . Smokeless tobacco: Never Used  Vaping Use  . Vaping Use: Never used  Substance Use Topics  . Alcohol use: No  . Drug use: No    Allergies  Allergen Reactions  . Ciprofloxacin Hives  . Cleocin  [Clindamycin] Hives  . Cleocin [Clindamycin Hcl] Hives  . Clindamycin Hcl Hives  . Iodinated Diagnostic Agents Hives    Iohexol  . Sulfa Antibiotics Hives  . Ceftin  [Cefuroxime] Hives  . Cefuroxime Axetil   . Clarithromycin   . Clindamycin Phosphate   . Codeine   . Cymbalta [Duloxetine Hcl]   . Doxycycline   . Iohexol      Desc:  hives   . Levofloxacin   . Metoclopramide Hcl   . Other   . Penicillin G   . Penicillins     Current Outpatient Medications  Medication Sig Dispense Refill  . ALPRAZolam (XANAX XR) 0.5 MG 24 hr tablet Take 0.5 mg by mouth daily.    Marland Kitchen amLODipine (NORVASC) 5 MG tablet Take 5 mg by mouth daily.    . Ascorbic Acid (VITAMIN C) 1000 MG tablet Take 1,000 mg by mouth at bedtime.    Marland Kitchen aspirin 81 MG tablet Take 81 mg by mouth at bedtime.    Marland Kitchen atorvastatin (LIPITOR) 40 MG tablet Take 40 mg by mouth every evening.    . calcium carbonate (OS-CAL) 600 MG TABS Take 1,200 mg by mouth at bedtime.     . calcium carbonate (TUMS - DOSED IN MG ELEMENTAL CALCIUM) 500 MG chewable tablet Chew 1 tablet by mouth daily as needed for indigestion or heartburn.    . Cholecalciferol 25 MCG (1000 UT) tablet Take 2,000 Units by mouth at bedtime.     . clopidogrel (PLAVIX) 75 MG tablet Take 75 mg by mouth daily.    . enalapril (VASOTEC) 20 MG tablet Take 2 tablets (40 mg total) by mouth daily. 676 tablet 3  . folic acid (FOLVITE) 195 MCG tablet Take 800 mcg by mouth at bedtime.    . Garlic 0932 MG CAPS Take 1,000 mg by mouth at bedtime.    Marland Kitchen ibuprofen (ADVIL,MOTRIN) 200 MG tablet Take 400 mg by mouth every 4 (four) hours as needed for pain.     Marland Kitchen levothyroxine (SYNTHROID) 125 MCG tablet Take 125 mcg by mouth daily.    Marland Kitchen LORazepam (ATIVAN) 0.5 MG tablet Take 0.5 mg by mouth 3 (three) times daily as needed for sleep.     . Methylcobalamin (B-12) 5000 MCG TBDP Take 5,000 mcg by mouth at bedtime.    . metoprolol succinate (TOPROL-XL) 25 MG 24 hr tablet Take 25 mg by mouth daily.    . montelukast (SINGULAIR) 10 MG tablet Take 10 mg by mouth daily.    . Multiple Vitamin (MULTIVITAMIN) capsule Take 1 capsule by mouth daily.    . Omega 3 1000 MG CAPS Take 1,000 mg by mouth at bedtime.    . Potassium 99 MG TABS Take 99 mg by mouth daily.    Marland Kitchen PROAIR HFA 108 (90 BASE) MCG/ACT inhaler Inhale 2 puffs into the lungs every 6 (six)  hours as needed for wheezing or shortness of breath.     . pyridOXINE (VITAMIN B-6) 100 MG tablet Take  100 mg by mouth daily.    . vitamin E 400 UNIT capsule Take 400 Units by mouth at bedtime.    Marland Kitchen HYDROmorphone (DILAUDID) 2 MG tablet Take 1 mg by mouth every 4 (four) hours as needed for moderate pain or severe pain.  (Patient not taking: No sig reported)    . traMADol (ULTRAM) 50 MG tablet Take 1 tablet (50 mg total) by mouth every 6 (six) hours as needed. (Patient not taking: Reported on 05/02/2020) 60 tablet 1   No current facility-administered medications for this visit.     Physical Exam  Blood pressure (!) 147/77, pulse 76, height 5' 3.5" (1.613 m), weight 113 lb (51.3 kg).  Constitutional: overall normal hygiene, normal nutrition, well developed, normal grooming, normal body habitus. Assistive device:none  Musculoskeletal: gait and station Limp none, muscle tone and strength are normal, no tremors or atrophy is present.  .  Neurological: coordination overall normal.  Deep tendon reflex/nerve stretch intact.  Sensation normal.  Cranial nerves II-XII intact.   Skin:   Normal overall no scars, lesions, ulcers or rashes. No psoriasis.  Psychiatric: Alert and oriented x 3.  Recent memory intact, remote memory unclear.  Normal mood and affect. Well groomed.  Good eye contact.  Cardiovascular: overall no swelling, no varicosities, no edema bilaterally, normal temperatures of the legs and arms, no clubbing, cyanosis and good capillary refill.  Lymphatic: palpation is normal.  Right knee has good ROM and no limp today.  NV intact.   Spine/Pelvis examination:  Inspection:  Overall, sacoiliac joint benign and hips nontender; without crepitus or defects.   Thoracic spine inspection: Alignment normal without kyphosis present   Lumbar spine inspection:  Alignment  with normal lumbar lordosis, without scoliosis apparent.   Thoracic spine palpation:  without tenderness of spinal  processes   Lumbar spine palpation: without tenderness of lumbar area; without tightness of lumbar muscles    Range of Motion:   Lumbar flexion, forward flexion is normal without pain or tenderness    Lumbar extension is full without pain or tenderness   Left lateral bend is normal without pain or tenderness   Right lateral bend is normal without pain or tenderness   Straight leg raising is normal  Strength & tone: normal   Stability overall normal stability  All other systems reviewed and are negative   The patient has been educated about the nature of the problem(s) and counseled on treatment options.  The patient appeared to understand what I have discussed and is in agreement with it.  Encounter Diagnoses  Name Primary?  . Lumbar pain Yes  . Chronic pain of right knee     PLAN Call if any problems.  Precautions discussed.  Continue current medications.   Return to clinic 1 month   To see neurosurgeon for paresthesia on the right and nerve root irritation.  I will see her in one month for the knee.  Electronically Signed Sanjuana Kava, MD 3/8/20223:30 PM

## 2020-05-02 NOTE — Progress Notes (Signed)
MRI Spine Lumbar WO IV Contrast  Anatomical Region Laterality Modality  T-spine -- Magnetic Resonance  L-spine -- --  Pelvis -- --    Impression Performed by [id number] IMPRESSION:  1. No significant interval change or acute process.  2. 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 Again this is most notable for moderate bilateral neuroforaminal stenosis at L3-L4.   Electronically Signed by: Bryon Lions  Narrative Performed by [phone] INDICATION: Dorsalgia, unspecified. Low back pain, numbness in the RIGHT leg. Symptoms for years.   STUDY: MRI of the lumbar spine without intravenous contrast performed on 02/04/2020 3:12 PM.   COMPARISON: 03/12/2019.   TECHNIQUE: Multiplanar, multisequence MR imaging obtained through the lumbar spine without contrast on 02/04/2020 3:12 PM.   CONTRAST: None.   FINDINGS:  # Osseous structures: Again noted are chronic compression deformities of the T12 and L3 vertebral body, 70% and 50% loss of vertebral body height, respectively. No new fracture appreciated. Small anterolateral osteophytes appreciated at L1-L2 to L3-L4.  # Alignment:There is a dextroscoliosis centered at L2-L3. There is a mild rotary component.  # Conus medullaris/cauda equina: Spinal cord signal is normal and terminates at L1. The cauda equina is unremarkable.   # Lower thoracic spine: Mild retropulsion of the superior endplate of B35 resulting in mild effacement of the ventral thecal sac. Facet arthrosis on the RIGHT with ligamentum flavum redundancy. Mild RIGHT neuroforaminal stenosis. LEFT neuroforamen is patent.   # T12-L1: Disc desiccation with small disc bulge. Mild facet perch of the with ligamentum flavum redundancy. Spinal canal and foramen are patent. This is unchanged.  # L1-L2: Disc desiccation with a disc bulge and bilateral facet  hypertrophy. This is producing mild spinal canal and LEFT lateral recess narrowing. Mild neuroforaminal stenosis. This is unchanged.  # L2-L3: Disc desiccation with a disc bulge.Bilateral facet hypertrophy. Mild bilateral neuroforaminal stenosis, greater on the LEFT. This is unchanged.  # L3-L4: Disc desiccation with a disc bulge. Bilateral facet hypertrophy and ligamentum flavum redundancy. Mild spinal canal/lateral recess narrowing and moderate bilateral neuroforaminal stenosis. This is unchanged.  # L4-L5: Prior RIGHT hemilaminectomy. Disc desiccation with a small disc bulge. Mild facet hypertrophy. Spinal canal and RIGHT lateral recess have been decompressed. No significant foraminal encroachment. This is unchanged.  # L5-S1: No significant disc herniation, spinal canal, or neuroforaminal compromise.   # Paraspinal tissues: Unremarkable.   # Additional comments: None.  Procedure Note  Bryon Lions, MD - 02/07/2020  Formatting of this note might be different from the original.  INDICATION: Dorsalgia, unspecified. Low back pain, numbness in the RIGHT leg. Symptoms for years.   STUDY: MRI of the lumbar spine without intravenous contrastperformed on 02/04/2020 3:12 PM.   COMPARISON: 03/12/2019.   TECHNIQUE: Multiplanar, multisequence MR imaging obtained through the lumbar spine without contrast on 02/04/2020 3:12 PM.   CONTRAST: None.   FINDINGS:  # Osseous structures: Again noted are chronic compression deformities of the T12 and L3 vertebral body, 70% and 50% loss of vertebral body height, respectively. No new fracture appreciated. Small anterolateral osteophytes appreciated at L1-L2 to L3-L4.  # Alignment:There is a dextroscoliosis centered at L2-L3. There is a mild rotary component.  # Conus medullaris/cauda equina: Spinal cord signal is normal and terminates at L1. The cauda equina is unremarkable.   # Lower thoracic spine: Mild retropulsion of the superior endplate of  H29 resulting in mild effacement of the  ventral thecal sac. Facet arthrosis on the RIGHT with ligamentum flavum redundancy. Mild RIGHT neuroforaminal stenosis. LEFT neuroforamen is patent.   # T12-L1: Disc desiccation with small disc bulge. Mild facet perch of the with ligamentum flavum redundancy. Spinal canal and foramen are patent. This is unchanged.  # L1-L2: Disc desiccation with a disc bulge and bilateral facet hypertrophy. This is producing mild spinal canal and LEFT lateral recess narrowing. Mild neuroforaminal stenosis. This is unchanged.  # L2-L3: Disc desiccation with a disc bulge. Bilateral facet hypertrophy. Mild bilateral neuroforaminal stenosis, greater on the LEFT. This is unchanged.  # L3-L4: Disc desiccation with a disc bulge. Bilateral facet hypertrophy and ligamentum flavum redundancy. Mild spinal canal/lateral recess narrowing and moderate bilateral neuroforaminal stenosis. This is unchanged.  # L4-L5: Prior RIGHT hemilaminectomy. Disc desiccation with a small disc bulge. Mild facet hypertrophy. Spinal canal and RIGHT lateral recess have been decompressed. No significant foraminal encroachment. This is unchanged.  # L5-S1: No significant disc herniation, spinal canal, or neuroforaminal compromise.   # Paraspinal tissues: Unremarkable.   # Additional comments: None.    IMPRESSION:  1. No significant interval change or acute process.  2. 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 Again this is most notable for moderate bilateral neuroforaminal stenosis at L3-L4.   Electronically Signed by: Bryon Lions Exam End: 02/04/20 4:21 PM   Specimen Collected: 02/07/20 2:08 PM Last Resulted: 02/07/20 2:27 PM  Received From: [facility]  Result Received: 02/14/20 9:51 AM

## 2020-05-02 NOTE — Patient Instructions (Signed)
We will send records to [facility], Dr Arnoldo Morale and they will call you with appointment / sometimes when requesting a specific doctor it takes longer to get appointment, but you will get a call   If you have not heard anything within a week, you can call them [phone]

## 2020-05-18 DIAGNOSIS — K219 Gastro-esophageal reflux disease without esophagitis: Secondary | ICD-10-CM | POA: Diagnosis not present

## 2020-05-18 DIAGNOSIS — I6529 Occlusion and stenosis of unspecified carotid artery: Secondary | ICD-10-CM | POA: Diagnosis not present

## 2020-05-18 DIAGNOSIS — Z681 Body mass index (BMI) 19 or less, adult: Secondary | ICD-10-CM | POA: Diagnosis not present

## 2020-05-18 DIAGNOSIS — J449 Chronic obstructive pulmonary disease, unspecified: Secondary | ICD-10-CM | POA: Diagnosis not present

## 2020-05-18 DIAGNOSIS — I2510 Atherosclerotic heart disease of native coronary artery without angina pectoris: Secondary | ICD-10-CM | POA: Diagnosis not present

## 2020-05-18 DIAGNOSIS — M810 Age-related osteoporosis without current pathological fracture: Secondary | ICD-10-CM | POA: Diagnosis not present

## 2020-05-18 DIAGNOSIS — I10 Essential (primary) hypertension: Secondary | ICD-10-CM | POA: Diagnosis not present

## 2020-05-18 DIAGNOSIS — E063 Autoimmune thyroiditis: Secondary | ICD-10-CM | POA: Diagnosis not present

## 2020-05-22 DIAGNOSIS — I10 Essential (primary) hypertension: Secondary | ICD-10-CM | POA: Diagnosis not present

## 2020-05-22 DIAGNOSIS — Z87891 Personal history of nicotine dependence: Secondary | ICD-10-CM | POA: Diagnosis not present

## 2020-05-30 ENCOUNTER — Ambulatory Visit: Payer: Medicare Other | Admitting: Orthopaedic Surgery

## 2020-06-20 DIAGNOSIS — I6529 Occlusion and stenosis of unspecified carotid artery: Secondary | ICD-10-CM | POA: Diagnosis not present

## 2020-06-20 DIAGNOSIS — G43709 Chronic migraine without aura, not intractable, without status migrainosus: Secondary | ICD-10-CM | POA: Diagnosis not present

## 2020-06-20 DIAGNOSIS — Z0000 Encounter for general adult medical examination without abnormal findings: Secondary | ICD-10-CM | POA: Diagnosis not present

## 2020-06-20 DIAGNOSIS — G894 Chronic pain syndrome: Secondary | ICD-10-CM | POA: Diagnosis not present

## 2020-06-20 DIAGNOSIS — R55 Syncope and collapse: Secondary | ICD-10-CM | POA: Diagnosis not present

## 2020-06-20 DIAGNOSIS — K219 Gastro-esophageal reflux disease without esophagitis: Secondary | ICD-10-CM | POA: Diagnosis not present

## 2020-06-20 DIAGNOSIS — I2510 Atherosclerotic heart disease of native coronary artery without angina pectoris: Secondary | ICD-10-CM | POA: Diagnosis not present

## 2020-06-20 DIAGNOSIS — I10 Essential (primary) hypertension: Secondary | ICD-10-CM | POA: Diagnosis not present

## 2020-06-20 DIAGNOSIS — J449 Chronic obstructive pulmonary disease, unspecified: Secondary | ICD-10-CM | POA: Diagnosis not present

## 2020-06-20 DIAGNOSIS — Z681 Body mass index (BMI) 19 or less, adult: Secondary | ICD-10-CM | POA: Diagnosis not present

## 2020-06-26 DIAGNOSIS — Z79899 Other long term (current) drug therapy: Secondary | ICD-10-CM | POA: Diagnosis not present

## 2020-06-26 DIAGNOSIS — J449 Chronic obstructive pulmonary disease, unspecified: Secondary | ICD-10-CM | POA: Diagnosis not present

## 2020-06-26 DIAGNOSIS — E7849 Other hyperlipidemia: Secondary | ICD-10-CM | POA: Diagnosis not present

## 2020-06-29 DIAGNOSIS — E871 Hypo-osmolality and hyponatremia: Secondary | ICD-10-CM | POA: Diagnosis not present

## 2020-06-30 DIAGNOSIS — Z681 Body mass index (BMI) 19 or less, adult: Secondary | ICD-10-CM | POA: Diagnosis not present

## 2020-06-30 DIAGNOSIS — J449 Chronic obstructive pulmonary disease, unspecified: Secondary | ICD-10-CM | POA: Diagnosis not present

## 2020-06-30 DIAGNOSIS — E871 Hypo-osmolality and hyponatremia: Secondary | ICD-10-CM | POA: Diagnosis not present

## 2020-07-20 DIAGNOSIS — Z780 Asymptomatic menopausal state: Secondary | ICD-10-CM | POA: Diagnosis not present

## 2020-07-20 DIAGNOSIS — M810 Age-related osteoporosis without current pathological fracture: Secondary | ICD-10-CM | POA: Diagnosis not present

## 2020-07-24 DIAGNOSIS — W19XXXA Unspecified fall, initial encounter: Secondary | ICD-10-CM | POA: Diagnosis not present

## 2020-07-24 DIAGNOSIS — S301XXA Contusion of abdominal wall, initial encounter: Secondary | ICD-10-CM | POA: Diagnosis not present

## 2020-07-24 DIAGNOSIS — Y92009 Unspecified place in unspecified non-institutional (private) residence as the place of occurrence of the external cause: Secondary | ICD-10-CM | POA: Diagnosis not present

## 2020-07-24 DIAGNOSIS — S20212A Contusion of left front wall of thorax, initial encounter: Secondary | ICD-10-CM | POA: Diagnosis not present

## 2020-07-24 DIAGNOSIS — S2242XA Multiple fractures of ribs, left side, initial encounter for closed fracture: Secondary | ICD-10-CM | POA: Diagnosis not present

## 2020-07-24 DIAGNOSIS — R0781 Pleurodynia: Secondary | ICD-10-CM | POA: Diagnosis not present

## 2020-07-24 DIAGNOSIS — Z9181 History of falling: Secondary | ICD-10-CM | POA: Diagnosis not present

## 2020-08-01 DIAGNOSIS — I10 Essential (primary) hypertension: Secondary | ICD-10-CM | POA: Diagnosis not present

## 2020-08-01 DIAGNOSIS — Z87891 Personal history of nicotine dependence: Secondary | ICD-10-CM | POA: Diagnosis not present

## 2020-08-07 DIAGNOSIS — M79642 Pain in left hand: Secondary | ICD-10-CM | POA: Diagnosis not present

## 2020-08-07 DIAGNOSIS — M79641 Pain in right hand: Secondary | ICD-10-CM | POA: Diagnosis not present

## 2020-08-08 DIAGNOSIS — E871 Hypo-osmolality and hyponatremia: Secondary | ICD-10-CM | POA: Diagnosis not present

## 2020-08-08 DIAGNOSIS — S2242XA Multiple fractures of ribs, left side, initial encounter for closed fracture: Secondary | ICD-10-CM | POA: Diagnosis not present

## 2020-08-08 DIAGNOSIS — Z681 Body mass index (BMI) 19 or less, adult: Secondary | ICD-10-CM | POA: Diagnosis not present

## 2020-08-08 DIAGNOSIS — M810 Age-related osteoporosis without current pathological fracture: Secondary | ICD-10-CM | POA: Diagnosis not present

## 2020-08-08 DIAGNOSIS — J449 Chronic obstructive pulmonary disease, unspecified: Secondary | ICD-10-CM | POA: Diagnosis not present

## 2020-08-21 DIAGNOSIS — L309 Dermatitis, unspecified: Secondary | ICD-10-CM | POA: Diagnosis not present

## 2020-09-05 DIAGNOSIS — G4730 Sleep apnea, unspecified: Secondary | ICD-10-CM | POA: Diagnosis not present

## 2020-09-28 DIAGNOSIS — G4733 Obstructive sleep apnea (adult) (pediatric): Secondary | ICD-10-CM | POA: Diagnosis not present

## 2020-10-04 DIAGNOSIS — H0100A Unspecified blepharitis right eye, upper and lower eyelids: Secondary | ICD-10-CM | POA: Diagnosis not present

## 2020-10-04 DIAGNOSIS — H00011 Hordeolum externum right upper eyelid: Secondary | ICD-10-CM | POA: Diagnosis not present

## 2020-10-05 DIAGNOSIS — R1915 Other abnormal bowel sounds: Secondary | ICD-10-CM | POA: Diagnosis not present

## 2020-10-05 DIAGNOSIS — Z681 Body mass index (BMI) 19 or less, adult: Secondary | ICD-10-CM | POA: Diagnosis not present

## 2020-10-05 DIAGNOSIS — K219 Gastro-esophageal reflux disease without esophagitis: Secondary | ICD-10-CM | POA: Diagnosis not present

## 2020-10-05 DIAGNOSIS — I10 Essential (primary) hypertension: Secondary | ICD-10-CM | POA: Diagnosis not present

## 2020-10-05 DIAGNOSIS — E063 Autoimmune thyroiditis: Secondary | ICD-10-CM | POA: Diagnosis not present

## 2020-11-01 DIAGNOSIS — E063 Autoimmune thyroiditis: Secondary | ICD-10-CM | POA: Diagnosis not present

## 2020-11-01 DIAGNOSIS — M1991 Primary osteoarthritis, unspecified site: Secondary | ICD-10-CM | POA: Diagnosis not present

## 2020-11-01 DIAGNOSIS — R7309 Other abnormal glucose: Secondary | ICD-10-CM | POA: Diagnosis not present

## 2020-11-01 DIAGNOSIS — I10 Essential (primary) hypertension: Secondary | ICD-10-CM | POA: Diagnosis not present

## 2020-11-01 DIAGNOSIS — Z681 Body mass index (BMI) 19 or less, adult: Secondary | ICD-10-CM | POA: Diagnosis not present

## 2020-11-01 DIAGNOSIS — S2242XD Multiple fractures of ribs, left side, subsequent encounter for fracture with routine healing: Secondary | ICD-10-CM | POA: Diagnosis not present

## 2020-11-01 DIAGNOSIS — M810 Age-related osteoporosis without current pathological fracture: Secondary | ICD-10-CM | POA: Diagnosis not present

## 2020-12-12 DIAGNOSIS — J449 Chronic obstructive pulmonary disease, unspecified: Secondary | ICD-10-CM | POA: Diagnosis not present

## 2020-12-12 DIAGNOSIS — M810 Age-related osteoporosis without current pathological fracture: Secondary | ICD-10-CM | POA: Diagnosis not present

## 2020-12-12 DIAGNOSIS — E063 Autoimmune thyroiditis: Secondary | ICD-10-CM | POA: Diagnosis not present

## 2020-12-12 DIAGNOSIS — Z681 Body mass index (BMI) 19 or less, adult: Secondary | ICD-10-CM | POA: Diagnosis not present

## 2020-12-12 DIAGNOSIS — I10 Essential (primary) hypertension: Secondary | ICD-10-CM | POA: Diagnosis not present

## 2020-12-25 DIAGNOSIS — H524 Presbyopia: Secondary | ICD-10-CM | POA: Diagnosis not present

## 2020-12-25 DIAGNOSIS — H26493 Other secondary cataract, bilateral: Secondary | ICD-10-CM | POA: Diagnosis not present

## 2020-12-25 DIAGNOSIS — H04123 Dry eye syndrome of bilateral lacrimal glands: Secondary | ICD-10-CM | POA: Diagnosis not present

## 2020-12-25 DIAGNOSIS — H0100A Unspecified blepharitis right eye, upper and lower eyelids: Secondary | ICD-10-CM | POA: Diagnosis not present

## 2021-01-01 DIAGNOSIS — H1131 Conjunctival hemorrhage, right eye: Secondary | ICD-10-CM | POA: Diagnosis not present

## 2021-01-02 DIAGNOSIS — I10 Essential (primary) hypertension: Secondary | ICD-10-CM | POA: Diagnosis not present

## 2021-01-02 DIAGNOSIS — M810 Age-related osteoporosis without current pathological fracture: Secondary | ICD-10-CM | POA: Diagnosis not present

## 2021-01-02 DIAGNOSIS — E063 Autoimmune thyroiditis: Secondary | ICD-10-CM | POA: Diagnosis not present

## 2021-01-02 DIAGNOSIS — Z681 Body mass index (BMI) 19 or less, adult: Secondary | ICD-10-CM | POA: Diagnosis not present

## 2021-01-02 DIAGNOSIS — D485 Neoplasm of uncertain behavior of skin: Secondary | ICD-10-CM | POA: Diagnosis not present

## 2021-02-07 DIAGNOSIS — J449 Chronic obstructive pulmonary disease, unspecified: Secondary | ICD-10-CM | POA: Diagnosis not present

## 2021-02-07 DIAGNOSIS — Z681 Body mass index (BMI) 19 or less, adult: Secondary | ICD-10-CM | POA: Diagnosis not present

## 2021-02-07 DIAGNOSIS — Z0001 Encounter for general adult medical examination with abnormal findings: Secondary | ICD-10-CM | POA: Diagnosis not present

## 2021-02-07 DIAGNOSIS — D485 Neoplasm of uncertain behavior of skin: Secondary | ICD-10-CM | POA: Diagnosis not present

## 2021-02-07 DIAGNOSIS — E063 Autoimmune thyroiditis: Secondary | ICD-10-CM | POA: Diagnosis not present

## 2021-02-07 DIAGNOSIS — G894 Chronic pain syndrome: Secondary | ICD-10-CM | POA: Diagnosis not present

## 2021-02-07 DIAGNOSIS — I10 Essential (primary) hypertension: Secondary | ICD-10-CM | POA: Diagnosis not present

## 2021-02-07 DIAGNOSIS — M810 Age-related osteoporosis without current pathological fracture: Secondary | ICD-10-CM | POA: Diagnosis not present

## 2021-02-08 DIAGNOSIS — J029 Acute pharyngitis, unspecified: Secondary | ICD-10-CM | POA: Diagnosis not present

## 2021-02-08 DIAGNOSIS — R059 Cough, unspecified: Secondary | ICD-10-CM | POA: Diagnosis not present

## 2021-02-08 DIAGNOSIS — Z20822 Contact with and (suspected) exposure to covid-19: Secondary | ICD-10-CM | POA: Diagnosis not present

## 2021-02-08 LAB — TSH: TSH: 7.09 — AB (ref <=5.90)

## 2021-02-15 DIAGNOSIS — C44311 Basal cell carcinoma of skin of nose: Secondary | ICD-10-CM | POA: Diagnosis not present

## 2021-02-15 DIAGNOSIS — L821 Other seborrheic keratosis: Secondary | ICD-10-CM | POA: Diagnosis not present

## 2021-02-17 DIAGNOSIS — U071 COVID-19: Secondary | ICD-10-CM | POA: Diagnosis not present

## 2021-02-28 DIAGNOSIS — U071 COVID-19: Secondary | ICD-10-CM | POA: Diagnosis not present

## 2021-02-28 DIAGNOSIS — J449 Chronic obstructive pulmonary disease, unspecified: Secondary | ICD-10-CM | POA: Diagnosis not present

## 2021-02-28 DIAGNOSIS — Z20822 Contact with and (suspected) exposure to covid-19: Secondary | ICD-10-CM | POA: Diagnosis not present

## 2021-02-28 DIAGNOSIS — J329 Chronic sinusitis, unspecified: Secondary | ICD-10-CM | POA: Diagnosis not present

## 2021-03-01 DIAGNOSIS — Z20822 Contact with and (suspected) exposure to covid-19: Secondary | ICD-10-CM | POA: Diagnosis not present

## 2021-03-06 ENCOUNTER — Encounter (HOSPITAL_COMMUNITY): Payer: Self-pay

## 2021-03-06 ENCOUNTER — Other Ambulatory Visit: Payer: Self-pay

## 2021-03-06 ENCOUNTER — Encounter (HOSPITAL_COMMUNITY)
Admission: RE | Admit: 2021-03-06 | Discharge: 2021-03-06 | Disposition: A | Discharge status: Home / Self Care | Payer: Medicare Other | Source: Ambulatory Visit | Attending: Internal Medicine | Admitting: Internal Medicine

## 2021-03-06 VITALS — BP 116/69 | HR 78 | Temp 98.5°F | Resp 20 | Ht 63.0 in | Wt 107.6 lb

## 2021-03-06 DIAGNOSIS — M4150 Other secondary scoliosis, site unspecified: Secondary | ICD-10-CM

## 2021-03-06 DIAGNOSIS — M810 Age-related osteoporosis without current pathological fracture: Secondary | ICD-10-CM | POA: Diagnosis not present

## 2021-03-06 LAB — COMPREHENSIVE METABOLIC PANEL
ALT: 17 U/L (ref 0–44)
AST: 25 U/L (ref 15–41)
Albumin: 4.2 g/dL (ref 3.5–5.0)
Alkaline Phosphatase: 36 U/L — ABNORMAL LOW (ref 38–126)
Anion gap: 7 (ref 5–15)
BUN: 17 mg/dL (ref 8–23)
CO2: 27 mmol/L (ref 22–32)
Calcium: 9.4 mg/dL (ref 8.9–10.3)
Chloride: 96 mmol/L — ABNORMAL LOW (ref 98–111)
Creatinine, Ser: 0.61 mg/dL (ref 0.44–1.00)
GFR, Estimated: >60 mL/min (ref >60)
Glucose, Bld: 103 mg/dL — ABNORMAL HIGH (ref 70–99)
Potassium: 3.7 mmol/L (ref 3.5–5.1)
Sodium: 130 mmol/L — ABNORMAL LOW (ref 135–145)
Total Bilirubin: 0.5 mg/dL (ref 0.3–1.2)
Total Protein: 7 g/dL (ref 6.5–8.1)

## 2021-03-06 MED ORDER — ZOLEDRONIC ACID 5 MG/100ML IV SOLN
5.0000 mg | Freq: Once | INTRAVENOUS | Status: AC
  Administered 2021-03-06: 5 mg via INTRAVENOUS
  Filled 2021-03-06: qty 100

## 2021-03-06 MED ORDER — SODIUM CHLORIDE 0.9 % IV SOLN
INTRAVENOUS | Status: DC
  Administered 2021-03-06: 250 mL via INTRAVENOUS

## 2021-03-15 ENCOUNTER — Other Ambulatory Visit: Payer: Self-pay

## 2021-03-15 ENCOUNTER — Ambulatory Visit: Payer: Medicare Other | Admitting: Cardiology

## 2021-03-15 ENCOUNTER — Encounter: Payer: Self-pay | Admitting: Cardiology

## 2021-03-15 VITALS — BP 112/64 | HR 75 | Ht 63.0 in | Wt 108.4 lb

## 2021-03-15 DIAGNOSIS — I739 Peripheral vascular disease, unspecified: Secondary | ICD-10-CM | POA: Diagnosis not present

## 2021-03-15 DIAGNOSIS — I6522 Occlusion and stenosis of left carotid artery: Secondary | ICD-10-CM | POA: Diagnosis not present

## 2021-03-15 DIAGNOSIS — I2510 Atherosclerotic heart disease of native coronary artery without angina pectoris: Secondary | ICD-10-CM

## 2021-03-15 DIAGNOSIS — Z9889 Other specified postprocedural states: Secondary | ICD-10-CM | POA: Insufficient documentation

## 2021-03-15 DIAGNOSIS — E038 Other specified hypothyroidism: Secondary | ICD-10-CM | POA: Diagnosis not present

## 2021-03-15 DIAGNOSIS — I10 Essential (primary) hypertension: Secondary | ICD-10-CM | POA: Insufficient documentation

## 2021-03-15 NOTE — Assessment & Plan Note (Addendum)
Currently on statin, aspirin.  LDL goal less than 70.  Recent carotid Dopplers February 2022 showed mild bilateral carotid artery stenosis.  I am comfortable with her stopping her aspirin 81 mg and continuing the Plavix 75 mg once a day for her ongoing treatment of peripheral vascular disease/carotid artery disease.  Watch for any signs of bleeding.

## 2021-03-15 NOTE — Progress Notes (Signed)
Cardiology Office Note:    Date:  03/15/2021   ID:  Belinda Clark, DOB [birth year], MRN [id number]  PCP:  Redmond School, MD   [facility] Providers Cardiologist:  None     Referring MD: Redmond School, MD    History of Present Illness:    Belinda Clark is a 81 y.o. female here for the evaluation of coronary artery disease at the request of Dr. Gerarda Fraction.  Had Left CEA. She was previously seen as a patient in [place] by Dr. Hamilton Capri, last seen on 04/06/2020 for a 1 year follow-up.  She had a carotid endarterectomy.  Was denying any cardiovascular symptoms.  She has been on Plavix enalapril metoprolol and atorvastatin as well as amlodipine.  Post thyroidectomy.  Subtotal.  It was stated that she had stable coronary artery disease and was to continue with medical management, follow-up.  She has had challenges with her blood pressure in the past.  In review of medical record she has had a previously elevated coronary calcium score of 1400 in 2014. NUC stress was done and normal.  She was placed at 1 point given her carotid disease as well as calcified coronary artery disease on low-dose Xarelto 2.5 mg twice a day with aspirin 81 mg.  Remotely she was seen by Dr. Gwenlyn Found.  Thyroid has been challenging she states.   Denies any bleeding issues, no syncope no fevers chills nausea vomiting.  Past Medical History:  Diagnosis Date   Anxiety    Arthritis    Asthma    Carotid artery stenosis    40-59 % -Left   GERD (gastroesophageal reflux disease)    Glaucoma    High blood pressure    HTN (hypertension)    Hyperlipidemia    Hypothyroidism    PONV (postoperative nausea and vomiting)     Past Surgical History:  Procedure Laterality Date   BREAST BIOPSY     x3   CATARACT EXTRACTION W/ INTRAOCULAR LENS  IMPLANT, BILATERAL     COLONOSCOPY N/A 10/05/2013   Procedure: COLONOSCOPY;  Surgeon: Jamesetta So, MD;  Location: [facility];  Service: Gastroenterology;   Laterality: N/A;   ENDARTERECTOMY Left 05/19/2017   Procedure: ENDARTERECTOMY CAROTID LEFT;  Surgeon: Rosetta Posner, MD;  [facility];  Service: Vascular;  Laterality: Left;   FRACTURE SURGERY     Spinal Fx 06/30/11   NECK MASS EXCISION     PATCH ANGIOPLASTY Left 05/19/2017   Procedure: PATCH ANGIOPLASTY LEFT CAROTID ARTERY USING HEMASHIELD PLATINUM FINESSE PATCH;  Surgeon: Rosetta Posner, MD;  Location: [facility];  Service: Vascular;  Laterality: Left;   RHINOPLASTY     3 total surgeries   SPINE SURGERY  06/30/2011   pt states surgery was 2002    THYROIDECTOMY     TONSILLECTOMY      Current Medications: Current Meds  Medication Sig   albuterol (VENTOLIN HFA) 108 (90 Base) MCG/ACT inhaler Inhale into the lungs every 6 (six) hours as needed for wheezing or shortness of breath.   ALPRAZolam (XANAX XR) 0.5 MG 24 hr tablet Take 0.5 mg by mouth daily.   amLODipine (NORVASC) 5 MG tablet Take 5 mg by mouth daily.   Ascorbic Acid (VITAMIN C) 1000 MG tablet Take 1,000 mg by mouth at bedtime.   aspirin 81 MG tablet Take 81 mg by mouth at bedtime.   atorvastatin (LIPITOR) 40 MG tablet Take 40 mg by mouth every evening.   calcium carbonate (  OS-CAL) 600 MG TABS Take 1,200 mg by mouth at bedtime.    calcium carbonate (TUMS - DOSED IN MG ELEMENTAL CALCIUM) 500 MG chewable tablet Chew 1 tablet by mouth daily as needed for indigestion or heartburn.   chlorthalidone (HYGROTON) 25 MG tablet Take 25 mg by mouth daily.   Cholecalciferol 25 MCG (1000 UT) tablet Take 2,000 Units by mouth at bedtime.    clopidogrel (PLAVIX) 75 MG tablet Take 75 mg by mouth daily.   enalapril (VASOTEC) 20 MG tablet Take 2 tablets (40 mg total) by mouth daily.   folic acid (FOLVITE) 384 MCG tablet Take 800 mcg by mouth at bedtime.   Garlic 6659 MG CAPS Take 1,000 mg by mouth at bedtime.   HYDROmorphone (DILAUDID) 2 MG tablet Take 1 mg by mouth every 4 (four) hours as needed for moderate pain or severe pain.   ibuprofen  (ADVIL,MOTRIN) 200 MG tablet Take 400 mg by mouth every 4 (four) hours as needed for pain.    levothyroxine (SYNTHROID) 125 MCG tablet Take 112 mcg by mouth daily.   LORazepam (ATIVAN) 0.5 MG tablet Take 0.5 mg by mouth 3 (three) times daily as needed for sleep.    Methylcobalamin (B-12) 5000 MCG TBDP Take 5,000 mcg by mouth at bedtime.   metoprolol succinate (TOPROL-XL) 25 MG 24 hr tablet Take 25 mg by mouth daily.   montelukast (SINGULAIR) 10 MG tablet Take 10 mg by mouth daily.   Multiple Vitamin (MULTIVITAMIN) capsule Take 1 capsule by mouth daily.   Omega 3 1000 MG CAPS Take 1,000 mg by mouth at bedtime.   Potassium 99 MG TABS Take 99 mg by mouth daily.   PROAIR HFA 108 (90 BASE) MCG/ACT inhaler Inhale 2 puffs into the lungs every 6 (six) hours as needed for wheezing or shortness of breath.    pyridOXINE (VITAMIN B-6) 100 MG tablet Take 100 mg by mouth daily.   traMADol (ULTRAM) 50 MG tablet Take 1 tablet (50 mg total) by mouth every 6 (six) hours as needed.   vitamin E 400 UNIT capsule Take 400 Units by mouth at bedtime.     Allergies:   Ciprofloxacin, Cleocin  [clindamycin], Cleocin [clindamycin hcl], Clindamycin hcl, Iodinated contrast media, Sulfa antibiotics, Ceftin  [cefuroxime], Cefuroxime axetil, Clarithromycin, Clindamycin phosphate, Codeine, Cymbalta [duloxetine hcl], Doxycycline, Iohexol, Levofloxacin, Metoclopramide hcl, Other, Penicillin g, and Penicillins   Social History   Socioeconomic History   Marital status: Widowed    Spouse name: Not on file   Number of children: Not on file   Years of education: Not on file   Highest education level: Not on file  Occupational History   Not on file  Tobacco Use   Smoking status: Former    Packs/day: 1.00    Years: 30.00    Pack years: 30.00    Types: Cigarettes    Quit date: 12/26/1993    Years since quitting: 27.2   Smokeless tobacco: Never  Vaping Use   Vaping Use: Never used  Substance and Sexual Activity   Alcohol  use: No   Drug use: No   Sexual activity: Not on file  Other Topics Concern   Not on file  Social History Narrative   Not on file   Social Determinants of Health   Financial Resource Strain: Not on file  Food Insecurity: Not on file  Transportation Needs: Not on file  Physical Activity: Not on file  Stress: Not on file  Social Connections: Not on file  Family History: The patient's family history includes Diabetes in her mother and another family member; Heart attack in her brother and father; Heart disease in her brother, father, and another family member; Hyperlipidemia in her brother, father, and mother; Hypertension in her mother; Stroke in her brother and maternal grandmother.  ROS:   Please see the history of present illness.     All other systems reviewed and are negative.  EKGs/Labs/Other Studies Reviewed:    The following studies were reviewed today:  Carotid Dopplers 04/17/2020: Right carotid 1 to 39%, left 1 to 39%, right external carotid artery greater than 50% stenosis.  EKG:  EKG is  ordered today.  The ekg ordered today demonstrates sinus rhythm 75 poor R wave progression 05/16/2017-sinus rhythm 75 bpm no other abnormalities.  Recent Labs: 03/06/2021: ALT 17; BUN 17; Creatinine, Ser 0.61; Potassium 3.7; Sodium 130  Recent Lipid Panel No results found for: CHOL, TRIG, HDL, CHOLHDL, VLDL, LDLCALC, LDLDIRECT   Risk Assessment/Calculations:              Physical Exam:    VS:  BP 112/64    Pulse 75    Ht 5\' 3"  (1.6 m)    Wt 108 lb 6.4 oz (49.2 kg)    SpO2 98%    BMI 19.20 kg/m     Wt Readings from Last 3 Encounters:  03/15/21 108 lb 6.4 oz (49.2 kg)  03/06/21 107 lb 9.6 oz (48.8 kg)  05/02/20 113 lb (51.3 kg)     GEN:  Well nourished, well developed in no acute distress HEENT: Normal NECK: No JVD; mild left bruit LYMPHATICS: No lymphadenopathy CARDIAC: RRR, no murmurs, no rubs, gallops RESPIRATORY:  Clear to auscultation without rales, wheezing  or rhonchi  ABDOMEN: Soft, non-tender, non-distended MUSCULOSKELETAL:  No edema; No deformity  SKIN: Warm and dry NEUROLOGIC:  Alert and oriented x 3 PSYCHIATRIC:  Normal affect   ASSESSMENT:    1. Carotid stenosis, left   2. Coronary artery disease involving native coronary artery of native heart without angina pectoris   3. PVD (peripheral vascular disease) ([place])   4. Primary hypertension   5. Other specified hypothyroidism   6. Status post left carotid endarterectomy    PLAN:    In order of problems listed above:  Coronary artery disease involving native coronary artery Calcium score 1400 previously.  Also has carotid plaque as well.  Agree with atorvastatin 40 mg daily.  LDL goal should be less than 70.  She previously had a nuclear stress test done in the past that was normal.  She is not having any exertional anginal symptoms.  Mild shortness of breath noted which is typical for her.  If symptoms change, she will let us know.  On Plavix.  Stopping aspirin 81.  PVD (peripheral vascular disease) ([facility]) Currently on statin, aspirin.  LDL goal less than 70.  Recent carotid Dopplers February 2022 showed mild bilateral carotid artery stenosis.  I am comfortable with her stopping her aspirin 81 mg and continuing the Plavix 75 mg once a day for her ongoing treatment of peripheral vascular disease/carotid artery disease.  Watch for any signs of bleeding.  Primary hypertension Multidrug regimen reviewed.  Serum sodium at 1.130.  This may be affected by hydrochlorothiazide.  This was added because of elevated blood pressure readings in the past.  Hypothyroidism She was concerned about her TSH.  She was worried that the endocrinology office that she was referred to, Dr. Denna Haggard, had not called her  and it has been several months.  I instructed her to call this office to inquire and to discuss with Dr. Gerarda Fraction and his team about next steps.  Status post left carotid endarterectomy We will  continue with Plavix monotherapy.  Carotid Doppler reviewed.  Stable.  Mild.         Medication Adjustments/Labs and Tests Ordered: Current medicines are reviewed at length with the patient today.  Concerns regarding medicines are outlined above.  Orders Placed This Encounter  Procedures   EKG 12-Lead   No orders of the defined types were placed in this encounter.   Patient Instructions  Medication Instructions:   STOP Aspirin   STAY on Plavix 75 mg daily    All other medications stay the same  Labwork: None today  Testing/Procedures: None today  Follow-Up: 1 year   Any Other Special Instructions Will Be Listed Below (If Applicable).  If you need a refill on your cardiac medications before your next appointment, please call your pharmacy.    Signed, Candee Furbish, MD  03/15/2021 2:07 PM    [place]

## 2021-03-15 NOTE — Assessment & Plan Note (Signed)
She was concerned about her TSH.  She was worried that the endocrinology office that she was referred to, Dr. Denna Haggard, had not called her and it has been several months.  I instructed her to call this office to inquire and to discuss with Dr. Gerarda Fraction and his team about next steps.

## 2021-03-15 NOTE — Assessment & Plan Note (Addendum)
Calcium score 1400 previously.  Also has carotid plaque as well.  Agree with atorvastatin 40 mg daily.  LDL goal should be less than 70.  She previously had a nuclear stress test done in the past that was normal.  She is not having any exertional anginal symptoms.  Mild shortness of breath noted which is typical for her.  If symptoms change, she will let us know.  On Plavix.  Stopping aspirin 81.

## 2021-03-15 NOTE — Assessment & Plan Note (Signed)
Multidrug regimen reviewed.  Serum sodium at 1.130.  This may be affected by hydrochlorothiazide.  This was added because of elevated blood pressure readings in the past.

## 2021-03-15 NOTE — Patient Instructions (Signed)
Medication Instructions:   STOP Aspirin   STAY on Plavix 75 mg daily    All other medications stay the same  Labwork: None today  Testing/Procedures: None today  Follow-Up: 1 year   Any Other Special Instructions Will Be Listed Below (If Applicable).  If you need a refill on your cardiac medications before your next appointment, please call your pharmacy.

## 2021-03-15 NOTE — Assessment & Plan Note (Signed)
We will continue with Plavix monotherapy.  Carotid Doppler reviewed.  Stable.  Mild.

## 2021-03-20 DIAGNOSIS — E063 Autoimmune thyroiditis: Secondary | ICD-10-CM | POA: Diagnosis not present

## 2021-03-20 DIAGNOSIS — Z681 Body mass index (BMI) 19 or less, adult: Secondary | ICD-10-CM | POA: Diagnosis not present

## 2021-03-20 DIAGNOSIS — I10 Essential (primary) hypertension: Secondary | ICD-10-CM | POA: Diagnosis not present

## 2021-03-22 DIAGNOSIS — E063 Autoimmune thyroiditis: Secondary | ICD-10-CM | POA: Diagnosis not present

## 2021-03-22 DIAGNOSIS — G4700 Insomnia, unspecified: Secondary | ICD-10-CM | POA: Diagnosis not present

## 2021-03-22 DIAGNOSIS — Z0001 Encounter for general adult medical examination with abnormal findings: Secondary | ICD-10-CM | POA: Diagnosis not present

## 2021-03-22 LAB — TSH: TSH: 17 — AB (ref 0.41–5.90)

## 2021-03-27 ENCOUNTER — Telehealth: Payer: Self-pay | Admitting: Orthopaedic Surgery

## 2021-03-27 NOTE — Telephone Encounter (Signed)
Patient came to office to inquire about appointment for hand and finger problem; relays she was seen by Dr Gerarda Fraction, orthopaedist with [facility], which Epic chart indicates. We discussed 2nd opinion protocol, regarding notes/films for Dr's review and advice regarding scheduling*  *Based on notes, please advise if patient would need to see orthopaedic hand specialist if she wishes to have a second opinion for same hand problem.

## 2021-03-28 DIAGNOSIS — E039 Hypothyroidism, unspecified: Secondary | ICD-10-CM | POA: Diagnosis not present

## 2021-03-28 DIAGNOSIS — I10 Essential (primary) hypertension: Secondary | ICD-10-CM | POA: Diagnosis not present

## 2021-03-28 DIAGNOSIS — Z681 Body mass index (BMI) 19 or less, adult: Secondary | ICD-10-CM | POA: Diagnosis not present

## 2021-03-28 DIAGNOSIS — G4700 Insomnia, unspecified: Secondary | ICD-10-CM | POA: Diagnosis not present

## 2021-03-28 NOTE — Telephone Encounter (Signed)
Called patient - reached and notified. Patient will decide.

## 2021-03-29 DIAGNOSIS — Z85828 Personal history of other malignant neoplasm of skin: Secondary | ICD-10-CM | POA: Diagnosis not present

## 2021-03-29 DIAGNOSIS — X32XXXD Exposure to sunlight, subsequent encounter: Secondary | ICD-10-CM | POA: Diagnosis not present

## 2021-03-29 DIAGNOSIS — L821 Other seborrheic keratosis: Secondary | ICD-10-CM | POA: Diagnosis not present

## 2021-03-29 DIAGNOSIS — L218 Other seborrheic dermatitis: Secondary | ICD-10-CM | POA: Diagnosis not present

## 2021-03-29 DIAGNOSIS — L570 Actinic keratosis: Secondary | ICD-10-CM | POA: Diagnosis not present

## 2021-03-29 DIAGNOSIS — Z08 Encounter for follow-up examination after completed treatment for malignant neoplasm: Secondary | ICD-10-CM | POA: Diagnosis not present

## 2021-04-06 ENCOUNTER — Encounter: Payer: Self-pay | Admitting: "Endocrinology

## 2021-04-06 ENCOUNTER — Ambulatory Visit: Payer: Medicare Other | Admitting: "Endocrinology

## 2021-04-06 VITALS — BP 138/70 | HR 76 | Ht 63.0 in | Wt 108.2 lb

## 2021-04-06 DIAGNOSIS — E890 Postprocedural hypothyroidism: Secondary | ICD-10-CM

## 2021-04-06 DIAGNOSIS — E0580 Other thyrotoxicosis without thyrotoxic crisis or storm: Secondary | ICD-10-CM

## 2021-04-06 MED ORDER — LEVOTHYROXINE SODIUM 88 MCG PO TABS: 88.0000 ug | ORAL_TABLET | Freq: Every day | ORAL | 2 refills | Status: DC

## 2021-04-06 NOTE — Progress Notes (Signed)
Endocrinology Consult Note                                            04/06/2021, 12:48 PM   Subjective:    Patient ID: Belinda Clark, female    DOB: 08-05-1940, PCP Redmond School, MD   Past Medical History:  Diagnosis Date   Anxiety    Arthritis    Asthma    Carotid artery stenosis    40-59 % -Left   GERD (gastroesophageal reflux disease)    Glaucoma    High blood pressure    HTN (hypertension)    Hyperlipidemia    Hypothyroidism    PONV (postoperative nausea and vomiting)    Past Surgical History:  Procedure Laterality Date   BREAST BIOPSY     x3   CATARACT EXTRACTION W/ INTRAOCULAR LENS  IMPLANT, BILATERAL     COLONOSCOPY N/A 10/05/2013   Procedure: COLONOSCOPY;  Surgeon: Jamesetta So, MD;  Location: [facility];  Service: Gastroenterology;  Laterality: N/A;   ENDARTERECTOMY Left 05/19/2017   Procedure: ENDARTERECTOMY CAROTID LEFT;  Surgeon: Rosetta Posner, MD;  Location: [facility];  Service: Vascular;  Laterality: Left;   FRACTURE SURGERY     Spinal Fx 06/30/11   NECK MASS EXCISION     PATCH ANGIOPLASTY Left 05/19/2017   Procedure: PATCH ANGIOPLASTY LEFT CAROTID ARTERY USING HEMASHIELD PLATINUM FINESSE PATCH;  Surgeon: Rosetta Posner, MD;  Location: [facility];  Service: Vascular;  Laterality: Left;   RHINOPLASTY     3 total surgeries   SPINE SURGERY  06/30/2011   pt states surgery was 2002    THYROIDECTOMY     TONSILLECTOMY     Social History   Socioeconomic History   Marital status: Widowed    Spouse name: Not on file   Number of children: Not on file   Years of education: Not on file   Highest education level: Not on file  Occupational History   Not on file  Tobacco Use   Smoking status: Former    Packs/day: 1.00    Years: 30.00    Pack years: 30.00    Types: Cigarettes    Quit date: 12/26/1993    Years since quitting: 27.2   Smokeless tobacco: Never  Vaping Use   Vaping Use: Never used  Substance and Sexual Activity   Alcohol use: No   Drug  use: No   Sexual activity: Not on file  Other Topics Concern   Not on file  Social History Narrative   Not on file   Social Determinants of Health   Financial Resource Strain: Not on file  Food Insecurity: Not on file  Transportation Needs: Not on file  Physical Activity: Not on file  Stress: Not on file  Social Connections: Not on file   Family History  Problem Relation Age of Onset   Diabetes Mother    Hypertension Mother    Hyperlipidemia Mother    Heart attack Father    Heart disease Father    Hyperlipidemia Father    Heart disease Other    Diabetes Other    Stroke Brother    Heart disease Brother    Heart attack Brother    Hyperlipidemia Brother    Stroke Maternal Grandmother    Outpatient Encounter Medications as of 04/06/2021  Medication Sig  levothyroxine (SYNTHROID) 88 MCG tablet Take 1 tablet (88 mcg total) by mouth daily before breakfast.   traZODone (DESYREL) 50 MG tablet Take 50 mg by mouth at bedtime. Take 3 tablets qhs   zoledronic acid (RECLAST) 5 MG/100ML SOLN injection Inject 5 mg into the vein once.   albuterol (VENTOLIN HFA) 108 (90 Base) MCG/ACT inhaler Inhale into the lungs every 6 (six) hours as needed for wheezing or shortness of breath.   ALPRAZolam (XANAX XR) 0.5 MG 24 hr tablet Take 0.5 mg by mouth daily.   amLODipine (NORVASC) 5 MG tablet Take 5 mg by mouth daily.   Ascorbic Acid (VITAMIN C) 1000 MG tablet Take 1,000 mg by mouth at bedtime.   aspirin 81 MG tablet Take 81 mg by mouth at bedtime. (Patient not taking: Reported on 04/06/2021)   atorvastatin (LIPITOR) 40 MG tablet Take 40 mg by mouth every evening.   calcium carbonate (OS-CAL) 600 MG TABS Take 1,200 mg by mouth at bedtime.    calcium carbonate (TUMS - DOSED IN MG ELEMENTAL CALCIUM) 500 MG chewable tablet Chew 1 tablet by mouth daily as needed for indigestion or heartburn.   chlorthalidone (HYGROTON) 25 MG tablet Take 25 mg by mouth daily.   Cholecalciferol 25 MCG (1000 UT) tablet  Take 2,000 Units by mouth at bedtime.    clopidogrel (PLAVIX) 75 MG tablet Take 75 mg by mouth daily.   enalapril (VASOTEC) 20 MG tablet Take 2 tablets (40 mg total) by mouth daily.   folic acid (FOLVITE) 229 MCG tablet Take 800 mcg by mouth at bedtime.   Garlic 7989 MG CAPS Take 1,000 mg by mouth at bedtime.   HYDROmorphone (DILAUDID) 2 MG tablet Take 1 mg by mouth every 4 (four) hours as needed for moderate pain or severe pain. (Patient not taking: Reported on 04/06/2021)   ibuprofen (ADVIL,MOTRIN) 200 MG tablet Take 400 mg by mouth every 4 (four) hours as needed for pain.    LORazepam (ATIVAN) 0.5 MG tablet Take 0.5 mg by mouth 3 (three) times daily as needed for sleep.    Methylcobalamin (B-12) 5000 MCG TBDP Take 5,000 mcg by mouth at bedtime.   metoprolol succinate (TOPROL-XL) 25 MG 24 hr tablet Take 50 mg by mouth daily.   montelukast (SINGULAIR) 10 MG tablet Take 10 mg by mouth daily.   Multiple Vitamin (MULTIVITAMIN) capsule Take 1 capsule by mouth daily.   Omega 3 1000 MG CAPS Take 1,000 mg by mouth at bedtime.   omeprazole (PRILOSEC) 20 MG capsule Take 20 mg by mouth 2 (two) times daily.   Potassium 99 MG TABS Take 99 mg by mouth daily.   PROAIR HFA 108 (90 BASE) MCG/ACT inhaler Inhale 2 puffs into the lungs every 6 (six) hours as needed for wheezing or shortness of breath.    pyridOXINE (VITAMIN B-6) 100 MG tablet Take 100 mg by mouth daily.   vitamin E 400 UNIT capsule Take 400 Units by mouth at bedtime.   [DISCONTINUED] levothyroxine (SYNTHROID) 125 MCG tablet Take 112 mcg by mouth daily.   [DISCONTINUED] SYNTHROID 137 MCG tablet Take 137 mcg by mouth daily.   [DISCONTINUED] traMADol (ULTRAM) 50 MG tablet Take 1 tablet (50 mg total) by mouth every 6 (six) hours as needed. (Patient not taking: Reported on 04/06/2021)   No facility-administered encounter medications on file as of 04/06/2021.   ALLERGIES: Allergies  Allergen Reactions   Ciprofloxacin Hives   Cleocin  [Clindamycin]  Hives   Cleocin [Clindamycin Hcl] Hives  Clindamycin Hcl Hives   Iodinated Contrast Media Hives    Iohexol   Sulfa Antibiotics Hives   Ceftin  [Cefuroxime] Hives   Cefuroxime Axetil    Clarithromycin    Clindamycin Phosphate    Codeine    Cymbalta [Duloxetine Hcl]    Doxycycline    Iohexol      Desc: hives    Levofloxacin    Metoclopramide Hcl    Other    Penicillin G    Penicillins     VACCINATION STATUS: Immunization History  Administered Date(s) Administered   Influenza, High Dose Seasonal PF 12/15/2016, 10/28/2018    HPI Belinda Clark is 81 y.o. female who presents today with a medical history as above. she is being seen in consultation for postsurgical hypothyroidism requested by Redmond School, MD.  History is obtained directly from the patient.  She is accompanied by her friend to clinic.  She underwent total thyroidectomy for what appears to be hyperthyroidism in 1973.  She recalls surgical findings were benign.  She was given various doses of thyroid hormone replacement over the years with various results.  She is currently on Synthroid 137 mcg p.o. daily.  She is experiencing symptoms including unintended weight loss, palpitations, anxiety, sleep deprivation, tremors, and intermittent fatigue. Her most recent labs include only TSH ranging from 7-17.  She does not have free T3 nor free T4 measurements. She really appears and reports frustration.  She does not necessarily separate her thyroid hormone from other medications. She did not have any problem with the generic levothyroxine as far as side effects. She denies any family history of thyroid dysfunction or thyroid malignancy.  She denies dysphagia, shortness of breath, nor voice change at this time.  Review of Systems  Constitutional: + Unintended weight loss, + fatigue, + subjective hyperthermia. Eyes: no blurry vision, no xerophthalmia ENT: no sore throat, no nodules palpated in throat, no  dysphagia/odynophagia, no hoarseness Cardiovascular: no Chest Pain, no Shortness of Breath, no palpitations, no leg swelling Respiratory: no cough, no shortness of breath Gastrointestinal: no Nausea/Vomiting/Diarhhea Musculoskeletal: no muscle/joint aches Skin: no rashes Neurological: + tremors, no numbness, no tingling, no dizziness Psychiatric: no depression, +anxiety  Objective:    Vitals with BMI 04/06/2021 03/15/2021 03/06/2021  Height 5\' 3"  5\' 3"  5\' 3"   Weight 108 lbs 3 oz 108 lbs 6 oz 107 lbs 10 oz  BMI 19.17 06.30 16.01  Systolic 093 235 573  Diastolic 70 64 69  Pulse 76 75 78    BP 138/70    Pulse 76    Ht 5\' 3"  (1.6 m)    Wt 108 lb 3.2 oz (49.1 kg)    BMI 19.17 kg/m   Wt Readings from Last 3 Encounters:  04/06/21 108 lb 3.2 oz (49.1 kg)  03/15/21 108 lb 6.4 oz (49.2 kg)  03/06/21 107 lb 9.6 oz (48.8 kg)    Physical Exam  Constitutional:  Body mass index is 19.17 kg/m.,  not in acute distress, + anxious state of mind Eyes: PERRLA, EOMI, no exophthalmos ENT: moist mucous membranes, + old, well-healed post thyroidectomy scar on anterior lower neck, no gross cervical lymphadenopathy Cardiovascular: normal precordial activity, Regular Rate and Rhythm, no Murmur/Rubs/Gallops Respiratory:  adequate breathing efforts, no gross chest deformity, Clear to auscultation bilaterally Gastrointestinal: abdomen soft, Non -tender, No distension, Bowel Sounds present, no gross organomegaly Musculoskeletal: no gross deformities, strength intact in all four extremities Skin: moist, warm, no rashes Neurological: + tremor with outstretched hands, +++Deep tendon  reflexes normal in bilateral lower extremities.  CMP ( most recent) CMP    Recent Results (from the past 2160 hour(s))  TSH     Status: Abnormal   Collection Time: 02/08/21 12:00 AM  Result Value Ref Range   TSH 7.09 (A) 0.41 - 5.90  Comprehensive metabolic panel     Status: Abnormal   Collection Time: 03/06/21 12:43 PM   Result Value Ref Range   Sodium 130 (L) 135 - 145 mmol/L   Potassium 3.7 3.5 - 5.1 mmol/L   Chloride 96 (L) 98 - 111 mmol/L   CO2 27 22 - 32 mmol/L   Glucose, Bld 103 (H) 70 - 99 mg/dL    Comment: Glucose reference range applies only to samples taken after fasting for at least 8 hours.   BUN 17 8 - 23 mg/dL   Creatinine, Ser 0.61 0.44 - 1.00 mg/dL   Calcium 9.4 8.9 - 10.3 mg/dL   Total Protein 7.0 6.5 - 8.1 g/dL   Albumin 4.2 3.5 - 5.0 g/dL   AST 25 15 - 41 U/L   ALT 17 0 - 44 U/L   Alkaline Phosphatase 36 (L) 38 - 126 U/L   Total Bilirubin 0.5 0.3 - 1.2 mg/dL   GFR, Estimated >60 >60 mL/min    Comment: (NOTE) Calculated using the CKD-EPI Creatinine Equation (2021)    Anion gap 7 5 - 15    Comment: Performed at [facility], [address], [place], [place] [address]  TSH     Status: Abnormal   Collection Time: 03/22/21 12:00 AM  Result Value Ref Range   TSH 17.00 (A) 0.41 - 5.90     Assessment & Plan:   1. Postsurgical hypothyroidism  - Belinda Clark  is being seen at a kind request of Redmond School, MD. - I have reviewed her available thyroid records and clinically evaluated the patient. - Based on these reviews, she has iatrogenic hyperthyroidism, postsurgical hypothyroidism.  I had a long discussion with the patient and her friend in the room.  She would benefit from de-escalation on her thyroid hormone replacement.  Based on her current weight, 75 to 88 mcg of thyroid hormone should be sufficient to treat her postsurgical hypothyroidism.  She denies any history suspicious for malabsorption. She is open to consider the generic levothyroxine in the lower dose.  I discussed and advised her to discontinue her current Synthroid 137 mcg.  She will start levothyroxine 88 mcg the day after tomorrow daily in the morning before breakfast.   - We discussed about the correct intake of her thyroid hormone, on empty stomach at fasting, with water, separated by at least 30  minutes from breakfast and other medications,  and separated by more than 4 hours from calcium, iron, multivitamins, acid reflux medications (PPIs). -Patient is made aware of the fact that thyroid hormone replacement is needed for life, dose to be adjusted by periodic monitoring of thyroid function tests. - she will need a repeat,  more complete thyroid function tests before her next visit in 5 weeks.  -In the absence of palpable thyroid remnant, she will not need thyroid imaging at this time.   - I did not initiate any new prescriptions today. - she is advised to maintain close follow up with Redmond School, MD for primary care needs.   - Time spent with the patient: 62 minutes, of which >50% was spent in  counseling her about her postsurgical hypothyroidism, iatrogenic hyperthyroidism and the rest  in obtaining information about her symptoms, reviewing her previous labs/studies ( including abstractions from other facilities),  evaluations, and treatments,  and developing a plan to confirm diagnosis and long term treatment based on the latest standards of care/guidelines; and documenting her care.  Belinda Clark participated in the discussions, expressed understanding, and voiced agreement with the above plans.  All questions were answered to her satisfaction. she is encouraged to contact clinic should she have any questions or concerns prior to her return visit.  Follow up plan: Return in about 5 weeks (around 05/11/2021) for F/U with Pre-visit Labs.   Glade Lloyd, MD [facility] [address] [place], [place] [address] Phone: [phone]  Fax: [phone]     04/06/2021, 12:48 PM  This note was partially dictated with voice recognition software. Similar sounding words can be transcribed inadequately or may not  be corrected upon review.

## 2021-04-12 ENCOUNTER — Other Ambulatory Visit (HOSPITAL_COMMUNITY): Payer: Self-pay | Admitting: Internal Medicine

## 2021-04-12 DIAGNOSIS — Z1231 Encounter for screening mammogram for malignant neoplasm of breast: Secondary | ICD-10-CM

## 2021-04-25 ENCOUNTER — Other Ambulatory Visit: Payer: Self-pay

## 2021-04-25 ENCOUNTER — Ambulatory Visit (HOSPITAL_COMMUNITY)
Admission: RE | Admit: 2021-04-25 | Discharge: 2021-04-25 | Disposition: A | Discharge status: Home / Self Care | Payer: Medicare Other | Source: Ambulatory Visit | Attending: Internal Medicine | Admitting: Internal Medicine

## 2021-04-25 DIAGNOSIS — Z1231 Encounter for screening mammogram for malignant neoplasm of breast: Secondary | ICD-10-CM | POA: Insufficient documentation

## 2021-05-01 ENCOUNTER — Other Ambulatory Visit: Payer: Self-pay | Admitting: *Deleted

## 2021-05-01 DIAGNOSIS — I6529 Occlusion and stenosis of unspecified carotid artery: Secondary | ICD-10-CM

## 2021-05-02 ENCOUNTER — Ambulatory Visit: Payer: Medicare Other

## 2021-05-02 ENCOUNTER — Other Ambulatory Visit: Payer: Self-pay

## 2021-05-02 ENCOUNTER — Ambulatory Visit: Payer: Medicare Other | Admitting: Vascular Surgery

## 2021-05-02 ENCOUNTER — Encounter: Payer: Self-pay | Admitting: Vascular Surgery

## 2021-05-02 VITALS — BP 135/79 | HR 73 | Ht 63.0 in | Wt 109.2 lb

## 2021-05-02 DIAGNOSIS — E890 Postprocedural hypothyroidism: Secondary | ICD-10-CM | POA: Diagnosis not present

## 2021-05-02 DIAGNOSIS — I6529 Occlusion and stenosis of unspecified carotid artery: Secondary | ICD-10-CM

## 2021-05-02 NOTE — Progress Notes (Signed)
? ? [facility] ? ?Patient name: Belinda Clark MRN: [id number] DOB: 06-04-1940 Sex: female ? ?REASON FOR VISIT: Follow-up carotid disease ? ?HPI: ?Belinda Clark is a 81 y.o. female here today for follow-up.  She looks quite good today.  She has had no new major medical difficulties.  She is status post left carotid endarterectomy by myself in March 2019 for severe asymptomatic disease.  She denies any new neurologic deficits. ? ?Past Medical History:  ?Diagnosis Date  ? Anxiety   ? Arthritis   ? Asthma   ? Carotid artery stenosis   ? 40-59 % -Left  ? GERD (gastroesophageal reflux disease)   ? Glaucoma   ? High blood pressure   ? HTN (hypertension)   ? Hyperlipidemia   ? Hypothyroidism   ? PONV (postoperative nausea and vomiting)   ? ? ?Family History  ?Problem Relation Age of Onset  ? Diabetes Mother   ? Hypertension Mother   ? Hyperlipidemia Mother   ? Heart attack Father   ? Heart disease Father   ? Hyperlipidemia Father   ? Heart disease Other   ? Diabetes Other   ? Stroke Brother   ? Heart disease Brother   ? Heart attack Brother   ? Hyperlipidemia Brother   ? Stroke Maternal Grandmother   ? ? ?SOCIAL HISTORY: ?Social History  ? ?Tobacco Use  ? Smoking status: Former  ?  Packs/day: 1.00  ?  Years: 30.00  ?  Pack years: 30.00  ?  Types: Cigarettes  ?  Quit date: 12/26/1993  ?  Years since quitting: 27.3  ? Smokeless tobacco: Never  ?Substance Use Topics  ? Alcohol use: No  ? ? ?Allergies  ?Allergen Reactions  ? Ciprofloxacin Hives  ? Cleocin  [Clindamycin] Hives  ? Cleocin [Clindamycin Hcl] Hives  ? Clindamycin Hcl Hives  ? Iodinated Contrast Media Hives  ?  Iohexol  ? Sulfa Antibiotics Hives  ? Ceftin  [Cefuroxime] Hives  ? Cefuroxime Axetil   ? Clarithromycin   ? Clindamycin Phosphate   ? Codeine   ? Cymbalta [Duloxetine Hcl]   ? Doxycycline   ? Iohexol   ?   Desc: hives ?  ? Levofloxacin   ? Metoclopramide Hcl   ? Other   ? Penicillin G   ?  Penicillins   ? ? ?Current Outpatient Medications  ?Medication Sig Dispense Refill  ? albuterol (VENTOLIN HFA) 108 (90 Base) MCG/ACT inhaler Inhale into the lungs every 6 (six) hours as needed for wheezing or shortness of breath.    ? amLODipine (NORVASC) 5 MG tablet Take 5 mg by mouth daily.    ? Ascorbic Acid (VITAMIN C) 1000 MG tablet Take 1,000 mg by mouth at bedtime.    ? atorvastatin (LIPITOR) 40 MG tablet Take 40 mg by mouth every evening.    ? calcium carbonate (OS-CAL) 600 MG TABS Take 1,200 mg by mouth at bedtime.     ? calcium carbonate (TUMS - DOSED IN MG ELEMENTAL CALCIUM) 500 MG chewable tablet Chew 1 tablet by mouth daily as needed for indigestion or heartburn.    ? chlorthalidone (HYGROTON) 25 MG tablet Take 25 mg by mouth daily.    ? Cholecalciferol 25 MCG (1000 UT) tablet Take 2,000 Units by mouth at bedtime.     ? clopidogrel (PLAVIX) 75 MG tablet Take 75 mg by mouth daily.    ? enalapril (VASOTEC) 20 MG tablet Take 2 tablets (40 mg  total) by mouth daily. 180 tablet 3  ? folic acid (FOLVITE) 595 MCG tablet Take 800 mcg by mouth at bedtime.    ? Garlic 6387 MG CAPS Take 1,000 mg by mouth at bedtime.    ? ibuprofen (ADVIL,MOTRIN) 200 MG tablet Take 400 mg by mouth every 4 (four) hours as needed for pain.     ? levothyroxine (SYNTHROID) 88 MCG tablet Take 1 tablet (88 mcg total) by mouth daily before breakfast. 30 tablet 2  ? LORazepam (ATIVAN) 0.5 MG tablet Take 0.5 mg by mouth 3 (three) times daily as needed for sleep.     ? Methylcobalamin (B-12) 5000 MCG TBDP Take 5,000 mcg by mouth at bedtime.    ? metoprolol succinate (TOPROL-XL) 25 MG 24 hr tablet Take 50 mg by mouth daily.    ? montelukast (SINGULAIR) 10 MG tablet Take 10 mg by mouth daily.    ? Multiple Vitamin (MULTIVITAMIN) capsule Take 1 capsule by mouth daily.    ? Omega 3 1000 MG CAPS Take 1,000 mg by mouth at bedtime.    ? omeprazole (PRILOSEC) 20 MG capsule Take 20 mg by mouth 2 (two) times daily.    ? PROAIR HFA 108 (90 BASE) MCG/ACT  inhaler Inhale 2 puffs into the lungs every 6 (six) hours as needed for wheezing or shortness of breath.     ? pyridOXINE (VITAMIN B-6) 100 MG tablet Take 100 mg by mouth daily.    ? traZODone (DESYREL) 50 MG tablet Take 50 mg by mouth at bedtime. Take 3 tablets qhs    ? vitamin E 400 UNIT capsule Take 400 Units by mouth at bedtime.    ? zoledronic acid (RECLAST) 5 MG/100ML SOLN injection Inject 5 mg into the vein once.    ? ALPRAZolam (XANAX XR) 0.5 MG 24 hr tablet Take 0.5 mg by mouth daily. (Patient not taking: Reported on 05/02/2021)    ? ?No current facility-administered medications for this visit.  ? ? ?REVIEW OF SYSTEMS:  ?'[X]'$  denotes positive finding, '[ ]'$  denotes negative finding ?Cardiac  Comments:  ?Chest pain or chest pressure:    ?Shortness of breath upon exertion:    ?Short of breath when lying flat:    ?Irregular heart rhythm:    ?    ?Vascular    ?Pain in calf, thigh, or hip brought on by ambulation:    ?Pain in feet at night that wakes you up from your sleep:     ?Blood clot in your veins:    ?Leg swelling:     ?    ? ? ?PHYSICAL EXAM: ?Vitals:  ? 05/02/21 0835  ?BP: 135/79  ?Pulse: 73  ?Weight: 109 lb 3.2 oz (49.5 kg)  ?Height: '5\' 3"'$  (1.6 m)  ? ? ?GENERAL: The patient is a well-nourished female, in no acute distress. The vital signs are documented above. ?CARDIOVASCULAR: Well-healed left neck incision.  No carotid bruits bilaterally.  2+ radial pulses bilaterally. ?PULMONARY: There is good air exchange  ?MUSCULOSKELETAL: There are no major deformities or cyanosis. ?NEUROLOGIC: No focal weakness or paresthesias are detected. ?SKIN: There are no ulcers or rashes noted. ?PSYCHIATRIC: The patient has a normal affect. ? ?DATA:  ?Carotid duplex today reveals a widely patent endarterectomy with no evidence of restenosis.  Her right carotid has mild disease in the 1 to 39% stenosis range ? ?MEDICAL ISSUES: ?Stable overall.  Recommend that we see her again in 1 year for continued carotid surveillance.  She  knows to report  to the emergency room immediately should she develop any neurologic deficits ? ? ? ?Rosetta Posner, MD FACS ?[facility] ?Office Tel [phone] ? ?Note: Portions of this report may have been transcribed using voice recognition software.  Every effort has been made to ensure accuracy; however, inadvertent computerized transcription errors may still be present. ?

## 2021-05-03 LAB — T4, FREE: Free T4: 1.45 ng/dL (ref 0.82–1.77)

## 2021-05-03 LAB — TSH: TSH: 8.93 u[IU]/mL — ABNORMAL HIGH (ref 0.450–4.500)

## 2021-05-07 ENCOUNTER — Encounter: Payer: Self-pay | Admitting: "Endocrinology

## 2021-05-07 ENCOUNTER — Ambulatory Visit: Payer: Medicare Other | Admitting: "Endocrinology

## 2021-05-07 ENCOUNTER — Other Ambulatory Visit: Payer: Self-pay

## 2021-05-07 VITALS — BP 128/74 | HR 68 | Ht 63.0 in | Wt 111.6 lb

## 2021-05-07 DIAGNOSIS — E890 Postprocedural hypothyroidism: Secondary | ICD-10-CM

## 2021-05-07 DIAGNOSIS — E0580 Other thyrotoxicosis without thyrotoxic crisis or storm: Secondary | ICD-10-CM

## 2021-05-07 MED ORDER — LEVOTHYROXINE SODIUM 88 MCG PO TABS: 88.0000 ug | ORAL_TABLET | Freq: Every day | ORAL | 2 refills | Status: DC

## 2021-05-07 NOTE — Progress Notes (Signed)
05/07/2021, 8:59 PM  Endocrinology follow-up note   Subjective:    Patient ID: Belinda Clark, female    DOB: 09-14-1940, PCP Redmond School, MD   Past Medical History:  Diagnosis Date   Anxiety    Arthritis    Asthma    Carotid artery stenosis    40-59 % -Left   GERD (gastroesophageal reflux disease)    Glaucoma    High blood pressure    HTN (hypertension)    Hyperlipidemia    Hypothyroidism    PONV (postoperative nausea and vomiting)    Past Surgical History:  Procedure Laterality Date   BREAST BIOPSY     x3   CATARACT EXTRACTION W/ INTRAOCULAR LENS  IMPLANT, BILATERAL     COLONOSCOPY N/A 10/05/2013   Procedure: COLONOSCOPY;  Surgeon: Jamesetta So, MD;  [facility];  Service: Gastroenterology;  Laterality: N/A;   ENDARTERECTOMY Left 05/19/2017   Procedure: ENDARTERECTOMY CAROTID LEFT;  Surgeon: Rosetta Posner, MD;  Location: [facility];  Service: Vascular;  Laterality: Left;   FRACTURE SURGERY     Spinal Fx 06/30/11   NECK MASS EXCISION     PATCH ANGIOPLASTY Left 05/19/2017   Procedure: PATCH ANGIOPLASTY LEFT CAROTID ARTERY USING HEMASHIELD PLATINUM FINESSE PATCH;  Surgeon: Rosetta Posner, MD;  Location: [facility];  Service: Vascular;  Laterality: Left;   RHINOPLASTY     3 total surgeries   SPINE SURGERY  06/30/2011   pt states surgery was 2002    THYROIDECTOMY     TONSILLECTOMY     Social History   Socioeconomic History   Marital status: Widowed    Spouse name: Not on file   Number of children: Not on file   Years of education: Not on file   Highest education level: Not on file  Occupational History   Not on file  Tobacco Use   Smoking status: Former    Packs/day: 1.00    Years: 30.00    Pack years: 30.00    Types: Cigarettes    Quit date: 12/26/1993    Years since quitting: 27.3   Smokeless tobacco: Never  Vaping Use   Vaping Use: Never used  Substance and Sexual Activity   Alcohol use: No    Drug use: No   Sexual activity: Not on file  Other Topics Concern   Not on file  Social History Narrative   Not on file   Social Determinants of Health   Financial Resource Strain: Not on file  Food Insecurity: Not on file  Transportation Needs: Not on file  Physical Activity: Not on file  Stress: Not on file  Social Connections: Not on file   Family History  Problem Relation Age of Onset   Diabetes Mother    Hypertension Mother    Hyperlipidemia Mother    Heart attack Father    Heart disease Father    Hyperlipidemia Father    Heart disease Other    Diabetes Other    Stroke Brother    Heart disease Brother    Heart attack Brother    Hyperlipidemia Brother    Stroke Maternal Grandmother    Outpatient Encounter Medications as of 05/07/2021  Medication  Sig   albuterol (VENTOLIN HFA) 108 (90 Base) MCG/ACT inhaler Inhale into the lungs every 6 (six) hours as needed for wheezing or shortness of breath.   ALPRAZolam (XANAX XR) 0.5 MG 24 hr tablet Take 0.5 mg by mouth daily. (Patient not taking: Reported on 05/02/2021)   amLODipine (NORVASC) 5 MG tablet Take 5 mg by mouth daily.   Ascorbic Acid (VITAMIN C) 1000 MG tablet Take 1,000 mg by mouth at bedtime.   atorvastatin (LIPITOR) 40 MG tablet Take 40 mg by mouth every evening.   calcium carbonate (OS-CAL) 600 MG TABS Take 1,200 mg by mouth at bedtime.    calcium carbonate (TUMS - DOSED IN MG ELEMENTAL CALCIUM) 500 MG chewable tablet Chew 1 tablet by mouth daily as needed for indigestion or heartburn.   chlorthalidone (HYGROTON) 25 MG tablet Take 25 mg by mouth daily.   Cholecalciferol 25 MCG (1000 UT) tablet Take 2,000 Units by mouth at bedtime.    clopidogrel (PLAVIX) 75 MG tablet Take 75 mg by mouth daily.   enalapril (VASOTEC) 20 MG tablet Take 2 tablets (40 mg total) by mouth daily.   folic acid (FOLVITE) 256 MCG tablet Take 800 mcg by mouth at bedtime.   Garlic 3893 MG CAPS Take 1,000 mg by mouth at bedtime.   ibuprofen  (ADVIL,MOTRIN) 200 MG tablet Take 400 mg by mouth every 4 (four) hours as needed for pain.    levothyroxine (SYNTHROID) 88 MCG tablet Take 1 tablet (88 mcg total) by mouth daily before breakfast.   LORazepam (ATIVAN) 0.5 MG tablet Take 0.5 mg by mouth 3 (three) times daily as needed for sleep.    Methylcobalamin (B-12) 5000 MCG TBDP Take 5,000 mcg by mouth at bedtime.   metoprolol succinate (TOPROL-XL) 25 MG 24 hr tablet Take 50 mg by mouth daily.   montelukast (SINGULAIR) 10 MG tablet Take 10 mg by mouth daily.   Multiple Vitamin (MULTIVITAMIN) capsule Take 1 capsule by mouth daily.   Omega 3 1000 MG CAPS Take 1,000 mg by mouth at bedtime.   omeprazole (PRILOSEC) 20 MG capsule Take 20 mg by mouth 2 (two) times daily.   PROAIR HFA 108 (90 BASE) MCG/ACT inhaler Inhale 2 puffs into the lungs every 6 (six) hours as needed for wheezing or shortness of breath.    pyridOXINE (VITAMIN B-6) 100 MG tablet Take 100 mg by mouth daily.   traZODone (DESYREL) 50 MG tablet Take 50 mg by mouth at bedtime. Take 3 tablets qhs   vitamin E 400 UNIT capsule Take 400 Units by mouth at bedtime.   zoledronic acid (RECLAST) 5 MG/100ML SOLN injection Inject 5 mg into the vein once.   [DISCONTINUED] levothyroxine (SYNTHROID) 88 MCG tablet Take 1 tablet (88 mcg total) by mouth daily before breakfast.   No facility-administered encounter medications on file as of 05/07/2021.   ALLERGIES: Allergies  Allergen Reactions   Ciprofloxacin Hives   Cleocin  [Clindamycin] Hives   Cleocin [Clindamycin Hcl] Hives   Clindamycin Hcl Hives   Iodinated Contrast Media Hives    Iohexol   Sulfa Antibiotics Hives   Ceftin  [Cefuroxime] Hives   Cefuroxime Axetil    Clarithromycin    Clindamycin Phosphate    Codeine    Cymbalta [Duloxetine Hcl]    Doxycycline    Iohexol      Desc: hives    Levofloxacin    Metoclopramide Hcl    Other    Penicillin G    Penicillins  VACCINATION STATUS: Immunization History   Administered Date(s) Administered   Influenza, High Dose Seasonal PF 12/15/2016, 10/28/2018    HPI Belinda Clark is 81 y.o. female who presents today with a medical history as above. she is being seen in f/u after she was seen in consultation for postsurgical hypothyroidism requested by Redmond School, MD.  See note from last visit. She came alone today.   She underwent total thyroidectomy for what appears to be hyperthyroidism in 1973.  She recalls surgical findings were benign.  She was given various doses of thyroid hormone replacement over the years with various results.   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 Her dose was lowered to 88 mcg . She returns with significant clinical improvement, still c/o fatigue. She has gained 3 lbs of weight. She sleeps better, less anxious.   She takes her thyroid hormone by itself. She did not have any problem with the generic levothyroxine as far as side effects. She denies any family history of thyroid dysfunction or thyroid malignancy.  She denies dysphagia, shortness of breath, nor voice change at this time.  Review of Systems  Constitutional: + gained 3 lbs, + fatigue, - subjective hyperthermia. Eyes: no blurry vision, no xerophthalmia ENT: no sore throat, no nodules palpated in throat, no dysphagia/odynophagia, no hoarseness Cardiovascular: no Chest Pain, no Shortness of Breath, no palpitations, no leg swelling Respiratory: no cough, no shortness of breath Gastrointestinal: no Nausea/Vomiting/Diarhhea Musculoskeletal: no muscle/joint aches Skin: no rashes Neurological: - tremors, no numbness, no tingling, no dizziness Psychiatric: no depression, -anxiety  Objective:    Vitals with BMI 05/07/2021 05/02/2021 04/06/2021  Height '5\' 3"'$  '5\' 3"'$  '5\' 3"'$   Weight 111 lbs 10 oz 109 lbs 3 oz 108 lbs 3 oz  BMI 19.77 67.12 45.80  Systolic 998 338 250  Diastolic 74 79 70  Pulse 68 73 76    BP 128/74    Pulse 68     Ht '5\' 3"'$  (1.6 m)    Wt 111 lb 9.6 oz (50.6 kg)    BMI 19.77 kg/m   Wt Readings from Last 3 Encounters:  05/07/21 111 lb 9.6 oz (50.6 kg)  05/02/21 109 lb 3.2 oz (49.5 kg)  04/06/21 108 lb 3.2 oz (49.1 kg)    Physical Exam  Constitutional:  Body mass index is 19.77 kg/m.,  not in acute distress, ++ stable  state of mind Eyes: PERRLA, EOMI, no exophthalmos ENT: moist mucous membranes, + old well-healed post thyroidectomy scar on anterior lower neck, no gross cervical lymphadenopathy Cardiovascular: normal precordial activity, Regular Rate and Rhythm, no Murmur/Rubs/Gallops Respiratory:  adequate breathing efforts, no gross chest deformity, Clear to auscultation bilaterally Gastrointestinal: abdomen soft, Non -tender, No distension, Bowel Sounds present, no gross organomegaly Musculoskeletal: no gross deformities, strength intact in all four extremities Skin: moist, warm, no rashes Neurological: -  tremor with outstretched hands, +Deep tendon reflexes normal in bilateral lower extremities.  CMP ( most recent) CMP    Recent Results (from the past 2160 hour(s))  TSH     Status: Abnormal   Collection Time: 02/08/21 12:00 AM  Result Value Ref Range   TSH 7.09 (A) 0.41 - 5.90  Comprehensive metabolic panel     Status: Abnormal   Collection Time: 03/06/21 12:43 PM  Result Value Ref Range   Sodium 130 (L) 135 - 145 mmol/L   Potassium 3.7 3.5 - 5.1 mmol/L   Chloride 96 (L) 98 - 111 mmol/L  CO2 27 22 - 32 mmol/L   Glucose, Bld 103 (H) 70 - 99 mg/dL    Comment: Glucose reference range applies only to samples taken after fasting for at least 8 hours.   BUN 17 8 - 23 mg/dL   Creatinine, Ser 0.61 0.44 - 1.00 mg/dL   Calcium 9.4 8.9 - 10.3 mg/dL   Total Protein 7.0 6.5 - 8.1 g/dL   Albumin 4.2 3.5 - 5.0 g/dL   AST 25 15 - 41 U/L   ALT 17 0 - 44 U/L   Alkaline Phosphatase 36 (L) 38 - 126 U/L   Total Bilirubin 0.5 0.3 - 1.2 mg/dL   GFR, Estimated >60 >60 mL/min    Comment:  (NOTE) Calculated using the CKD-EPI Creatinine Equation (2021)    Anion gap 7 5 - 15    Comment: Performed at [facility], [address] [place], [place] [address]  TSH     Status: Abnormal   Collection Time: 03/22/21 12:00 AM  Result Value Ref Range   TSH 17.00 (A) 0.41 - 5.90  TSH     Status: Abnormal   Collection Time: 05/02/21  3:46 PM  Result Value Ref Range   TSH 8.930 (H) 0.450 - 4.500 uIU/mL  T4, free     Status: None   Collection Time: 05/02/21  3:46 PM  Result Value Ref Range   Free T4 1.45 0.82 - 1.77 ng/dL     Assessment & Plan:   1. Postsurgical hypothyroidism  -Her thyroid function tests show significant improvement. She is advised to continue Levothyroxine 88 mcg po qam.    - We discussed about the correct intake of her thyroid hormone, on empty stomach at fasting, with water, separated by at least 30 minutes from breakfast and other medications,  and separated by more than 4 hours from calcium, iron, multivitamins, acid reflux medications (PPIs). -Patient is made aware of the fact that thyroid hormone replacement is needed for life, dose to be adjusted by periodic monitoring of thyroid function tests.   - she will need a repeat,  more complete thyroid function tests before her next visit in 12 weeks.  -In the absence of palpable thyroid remnant, she will not need thyroid imaging at this time.   - she is advised to maintain close follow up with Redmond School, MD for primary care needs.  I spent 21 minutes in the care of the patient today including review of labs from Thyroid Function, CMP, and other relevant labs ; imaging/biopsy records (current and previous including abstractions from other facilities); face-to-face time discussing  her lab results and symptoms, medications doses, her options of short and long term treatment based on the latest standards of care / guidelines;   and documenting the encounter.  Belinda Clark  participated in the discussions,  expressed understanding, and voiced agreement with the above plans.  All questions were answered to her satisfaction. she is encouraged to contact clinic should she have any questions or concerns prior to her return visit.   Follow up plan: Return in about 3 months (around 08/07/2021) for F/U with Pre-visit Labs.   Glade Lloyd, MD [facility] [address] [place], [place] [address] Phone: [phone]  Fax: [phone]     05/07/2021, 8:59 PM  This note was partially dictated with voice recognition software. Similar sounding words can be transcribed inadequately or may not  be corrected upon review.

## 2021-05-15 ENCOUNTER — Ambulatory Visit: Payer: Medicare Other | Admitting: "Endocrinology

## 2021-05-22 DIAGNOSIS — I10 Essential (primary) hypertension: Secondary | ICD-10-CM | POA: Diagnosis not present

## 2021-05-22 DIAGNOSIS — E039 Hypothyroidism, unspecified: Secondary | ICD-10-CM | POA: Diagnosis not present

## 2021-05-22 DIAGNOSIS — Z681 Body mass index (BMI) 19 or less, adult: Secondary | ICD-10-CM | POA: Diagnosis not present

## 2021-05-22 DIAGNOSIS — M1991 Primary osteoarthritis, unspecified site: Secondary | ICD-10-CM | POA: Diagnosis not present

## 2021-06-15 DIAGNOSIS — I10 Essential (primary) hypertension: Secondary | ICD-10-CM | POA: Diagnosis not present

## 2021-06-15 DIAGNOSIS — J9801 Acute bronchospasm: Secondary | ICD-10-CM | POA: Diagnosis not present

## 2021-06-15 DIAGNOSIS — E039 Hypothyroidism, unspecified: Secondary | ICD-10-CM | POA: Diagnosis not present

## 2021-06-15 DIAGNOSIS — Z681 Body mass index (BMI) 19 or less, adult: Secondary | ICD-10-CM | POA: Diagnosis not present

## 2021-06-15 DIAGNOSIS — J329 Chronic sinusitis, unspecified: Secondary | ICD-10-CM | POA: Diagnosis not present

## 2021-06-28 DIAGNOSIS — E890 Postprocedural hypothyroidism: Secondary | ICD-10-CM | POA: Diagnosis not present

## 2021-06-29 LAB — TSH: TSH: 8.61 u[IU]/mL — ABNORMAL HIGH (ref 0.450–4.500)

## 2021-06-29 LAB — T4, FREE: Free T4: 1.42 ng/dL (ref 0.82–1.77)

## 2021-07-02 ENCOUNTER — Ambulatory Visit: Payer: Medicare Other | Admitting: "Endocrinology

## 2021-07-02 ENCOUNTER — Encounter: Payer: Self-pay | Admitting: "Endocrinology

## 2021-07-02 VITALS — BP 134/66 | HR 88 | Ht 63.0 in | Wt 110.2 lb

## 2021-07-02 DIAGNOSIS — E890 Postprocedural hypothyroidism: Secondary | ICD-10-CM

## 2021-07-02 MED ORDER — SYNTHROID 88 MCG PO TABS: 88.0000 ug | ORAL_TABLET | Freq: Every day | ORAL | 2 refills | Status: DC

## 2021-07-02 MED ORDER — SYNTHROID 88 MCG PO TABS: 88.0000 ug | ORAL_TABLET | Freq: Every day | ORAL | 1 refills | Status: DC

## 2021-07-02 NOTE — Progress Notes (Signed)
? ?     ?                                           07/02/2021, 11:55 AM ? ?Endocrinology follow-up note ? ? ?Subjective:  ? ? Patient ID: Belinda Clark, female    DOB: 1940/03/15, PCP Redmond School, MD ? ? ?Past Medical History:  ?Diagnosis Date  ? Anxiety   ? Arthritis   ? Asthma   ? Carotid artery stenosis   ? 40-59 % -Left  ? GERD (gastroesophageal reflux disease)   ? Glaucoma   ? High blood pressure   ? HTN (hypertension)   ? Hyperlipidemia   ? Hypothyroidism   ? PONV (postoperative nausea and vomiting)   ? ?Past Surgical History:  ?Procedure Laterality Date  ? BREAST BIOPSY    ? x3  ? CATARACT EXTRACTION W/ INTRAOCULAR LENS  IMPLANT, BILATERAL    ? COLONOSCOPY N/A 10/05/2013  ? Procedure: COLONOSCOPY;  Surgeon: Jamesetta So, MD;  [facility];  Service: Gastroenterology;  Laterality: N/A;  ? ENDARTERECTOMY Left 05/19/2017  ? Procedure: ENDARTERECTOMY CAROTID LEFT;  Surgeon: Rosetta Posner, MD;  Location: [facility];  Service: Vascular;  Laterality: Left;  ? FRACTURE SURGERY    ? Spinal Fx 06/30/11  ? NECK MASS EXCISION    ? PATCH ANGIOPLASTY Left 05/19/2017  ? Procedure: PATCH ANGIOPLASTY LEFT CAROTID ARTERY USING HEMASHIELD PLATINUM FINESSE PATCH;  Surgeon: Rosetta Posner, MD;  Location: [place];  Service: Vascular;  Laterality: Left;  ? RHINOPLASTY    ? 3 total surgeries  ? SPINE SURGERY  06/30/2011  ? pt states surgery was 2002   ? THYROIDECTOMY    ? TONSILLECTOMY    ? ?Social History  ? ?Socioeconomic History  ? Marital status: Widowed  ?  Spouse name: Not on file  ? Number of children: Not on file  ? Years of education: Not on file  ? Highest education level: Not on file  ?Occupational History  ? Not on file  ?Tobacco Use  ? Smoking status: Former  ?  Packs/day: 1.00  ?  Years: 30.00  ?  Pack years: 30.00  ?  Types: Cigarettes  ?  Quit date: 12/26/1993  ?  Years since quitting: 27.5  ? Smokeless tobacco: Never  ?Vaping Use  ? Vaping Use: Never used  ?Substance and Sexual Activity  ? Alcohol use: No  ?  Drug use: No  ? Sexual activity: Not on file  ?Other Topics Concern  ? Not on file  ?Social History Narrative  ? Not on file  ? ?Social Determinants of Health  ? ?Financial Resource Strain: Not on file  ?Food Insecurity: Not on file  ?Transportation Needs: Not on file  ?Physical Activity: Not on file  ?Stress: Not on file  ?Social Connections: Not on file  ? ?Family History  ?Problem Relation Age of Onset  ? Diabetes Mother   ? Hypertension Mother   ? Hyperlipidemia Mother   ? Heart attack Father   ? Heart disease Father   ? Hyperlipidemia Father   ? Heart disease Other   ? Diabetes Other   ? Stroke Brother   ? Heart disease Brother   ? Heart attack Brother   ? Hyperlipidemia Brother   ? Stroke Maternal Grandmother   ? ?Outpatient Encounter Medications as of 07/02/2021  ?Medication  Sig  ? [DISCONTINUED] SYNTHROID 88 MCG tablet Take 1 tablet (88 mcg total) by mouth daily before breakfast.  ? albuterol (VENTOLIN HFA) 108 (90 Base) MCG/ACT inhaler Inhale into the lungs every 6 (six) hours as needed for wheezing or shortness of breath.  ? amLODipine (NORVASC) 5 MG tablet Take 5 mg by mouth daily.  ? Ascorbic Acid (VITAMIN C) 1000 MG tablet Take 1,000 mg by mouth at bedtime.  ? atorvastatin (LIPITOR) 40 MG tablet Take 40 mg by mouth every evening.  ? calcium carbonate (OS-CAL) 600 MG TABS Take 1,200 mg by mouth at bedtime.   ? calcium carbonate (TUMS - DOSED IN MG ELEMENTAL CALCIUM) 500 MG chewable tablet Chew 1 tablet by mouth daily as needed for indigestion or heartburn. (Patient not taking: Reported on 07/02/2021)  ? chlorthalidone (HYGROTON) 25 MG tablet Take 25 mg by mouth daily.  ? Cholecalciferol 25 MCG (1000 UT) tablet Take 2,000 Units by mouth at bedtime.   ? clopidogrel (PLAVIX) 75 MG tablet Take 75 mg by mouth daily.  ? enalapril (VASOTEC) 20 MG tablet Take 2 tablets (40 mg total) by mouth daily.  ? folic acid (FOLVITE) 789 MCG tablet Take 800 mcg by mouth at bedtime.  ? Garlic 3810 MG CAPS Take 1,000 mg by mouth  at bedtime.  ? ibuprofen (ADVIL,MOTRIN) 200 MG tablet Take 400 mg by mouth every 4 (four) hours as needed for pain.   ? LORazepam (ATIVAN) 0.5 MG tablet Take 0.5 mg by mouth 3 (three) times daily as needed for sleep.   ? Methylcobalamin (B-12) 5000 MCG TBDP Take 5,000 mcg by mouth at bedtime.  ? metoprolol succinate (TOPROL-XL) 25 MG 24 hr tablet Take 50 mg by mouth daily.  ? montelukast (SINGULAIR) 10 MG tablet Take 10 mg by mouth daily.  ? Multiple Vitamin (MULTIVITAMIN) capsule Take 1 capsule by mouth daily.  ? Omega 3 1000 MG CAPS Take 1,000 mg by mouth at bedtime.  ? omeprazole (PRILOSEC) 20 MG capsule Take 20 mg by mouth 2 (two) times daily.  ? PROAIR HFA 108 (90 BASE) MCG/ACT inhaler Inhale 2 puffs into the lungs every 6 (six) hours as needed for wheezing or shortness of breath.   ? pyridOXINE (VITAMIN B-6) 100 MG tablet Take 100 mg by mouth daily.  ? SYNTHROID 88 MCG tablet Take 1 tablet (88 mcg total) by mouth daily before breakfast.  ? traZODone (DESYREL) 50 MG tablet Take 50 mg by mouth at bedtime. Take 3 tablets qhs (Patient not taking: Reported on 07/02/2021)  ? vitamin E 400 UNIT capsule Take 400 Units by mouth at bedtime.  ? zoledronic acid (RECLAST) 5 MG/100ML SOLN injection Inject 5 mg into the vein once.  ? [DISCONTINUED] ALPRAZolam (XANAX XR) 0.5 MG 24 hr tablet Take 0.5 mg by mouth daily. (Patient not taking: Reported on 05/02/2021)  ? [DISCONTINUED] levothyroxine (SYNTHROID) 88 MCG tablet Take 1 tablet (88 mcg total) by mouth daily before breakfast.  ? ?No facility-administered encounter medications on file as of 07/02/2021.  ? ?ALLERGIES: ?Allergies  ?Allergen Reactions  ? Ciprofloxacin Hives  ? Cleocin  [Clindamycin] Hives  ? Cleocin [Clindamycin Hcl] Hives  ? Clindamycin Hcl Hives  ? Iodinated Contrast Media Hives  ?  Iohexol  ? Sulfa Antibiotics Hives  ? Ceftin  [Cefuroxime] Hives  ? Cefuroxime Axetil   ? Clarithromycin   ? Clindamycin Phosphate   ? Codeine   ? Cymbalta [Duloxetine Hcl]   ?  Doxycycline   ? Iohexol   ?  Desc: hives ?  ? Levofloxacin   ? Metoclopramide Hcl   ? Other   ? Penicillin G   ? Penicillins   ? ? ?VACCINATION STATUS: ?Immunization History  ?Administered Date(s) Administered  ? Influenza, High Dose Seasonal PF 12/15/2016, 10/28/2018  ? ? ?HPI ?Belinda Clark is 81 y.o. female who presents today with a medical history as above. she is being seen in f/u after she was seen in consultation for postsurgical hypothyroidism requested by Redmond School, MD.  ?See note from last visit. ?She came alone today. ?  She underwent total thyroidectomy for what appears to be hyperthyroidism in 1973.  She recalls surgical findings were benign.  She was given various doses of thyroid hormone replacement over the years with various results.   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 Her dose was subsequently lowered to 88 mcg p.o. daily before breakfast.  She presents with symptomatic improvement as well as biochemical improvement.  She still complains of fatigue. ?She continues to gain some weight, appropriate for her.  ? ?She sleeps better, less anxious. ? ? She takes her thyroid hormone by itself. ?She did not have any problem with the generic levothyroxine as far as side effects. ?She denies any family history of thyroid dysfunction or thyroid malignancy.  She denies dysphagia, shortness of breath, nor voice change at this time. ? ?Review of Systems ? ?Constitutional: + gained 2 more lbs, + fatigue, - subjective hyperthermia. ?Eyes: no blurry vision, no xerophthalmia, she denies palpitations, tremor, heat intolerance. ?ENT: no sore throat, no nodules palpated in throat, no dysphagia/odynophagia, no hoarseness ? ? ?Objective:  ?  ? ?  07/02/2021  ? 10:45 AM 05/07/2021  ?  1:04 PM 05/02/2021  ?  8:35 AM  ?Vitals with BMI  ?Height '5\' 3"'$  '5\' 3"'$  '5\' 3"'$   ?Weight 110 lbs 3 oz 111 lbs 10 oz 109 lbs 3 oz  ?BMI 19.53 19.77 19.35  ?Systolic 546 503 546  ?Diastolic 66 74 79   ?Pulse 88 68 73  ? ? ?BP 134/66   Pulse 88   Ht '5\' 3"'$  (1.6 m)   Wt 110 lb 3.2 oz (50 kg)   BMI 19.52 kg/m?   ?Wt Readings from Last 3 Encounters:  ?07/02/21 110 lb 3.2 oz (50 kg)  ?05/07/21 111 lb 9.6 oz (50.

## 2021-07-05 ENCOUNTER — Telehealth: Payer: Self-pay | Admitting: "Endocrinology

## 2021-07-05 NOTE — Telephone Encounter (Signed)
Pt states Synthroid was called in and her insurance will not pay for it until June. She states she was on levothyroxine but Dr Dorris Fetch wanted her to be on the synthroid, and she is having a hard time getting the synthroid filled before June. Please advise. [phone] ?

## 2021-07-05 NOTE — Telephone Encounter (Signed)
Pt called back and said that [employer] did an override, you do not have to call them ?

## 2021-07-06 NOTE — Telephone Encounter (Signed)
Pt wants to know if she can continue to take levothyroxine instead of the synthroid. She states that she did not realize or pay attention to taking this with all her other meds and she was not waiting 30 min to hr before eating.  ?

## 2021-07-06 NOTE — Telephone Encounter (Signed)
Pt requesting a call back from nurse in regards to her Levothyroxine. ?

## 2021-07-09 NOTE — Telephone Encounter (Signed)
Pt.notified

## 2021-07-26 DIAGNOSIS — E559 Vitamin D deficiency, unspecified: Secondary | ICD-10-CM | POA: Diagnosis not present

## 2021-07-26 DIAGNOSIS — Z0001 Encounter for general adult medical examination with abnormal findings: Secondary | ICD-10-CM | POA: Diagnosis not present

## 2021-07-26 DIAGNOSIS — E039 Hypothyroidism, unspecified: Secondary | ICD-10-CM | POA: Diagnosis not present

## 2021-07-31 DIAGNOSIS — R319 Hematuria, unspecified: Secondary | ICD-10-CM | POA: Diagnosis not present

## 2021-07-31 DIAGNOSIS — Z0001 Encounter for general adult medical examination with abnormal findings: Secondary | ICD-10-CM | POA: Diagnosis not present

## 2021-07-31 DIAGNOSIS — M5136 Other intervertebral disc degeneration, lumbar region: Secondary | ICD-10-CM | POA: Diagnosis not present

## 2021-07-31 DIAGNOSIS — E039 Hypothyroidism, unspecified: Secondary | ICD-10-CM | POA: Diagnosis not present

## 2021-07-31 DIAGNOSIS — E063 Autoimmune thyroiditis: Secondary | ICD-10-CM | POA: Diagnosis not present

## 2021-07-31 DIAGNOSIS — Z681 Body mass index (BMI) 19 or less, adult: Secondary | ICD-10-CM | POA: Diagnosis not present

## 2021-07-31 DIAGNOSIS — I10 Essential (primary) hypertension: Secondary | ICD-10-CM | POA: Diagnosis not present

## 2021-07-31 DIAGNOSIS — M1991 Primary osteoarthritis, unspecified site: Secondary | ICD-10-CM | POA: Diagnosis not present

## 2021-08-06 DIAGNOSIS — M1991 Primary osteoarthritis, unspecified site: Secondary | ICD-10-CM | POA: Diagnosis not present

## 2021-08-06 DIAGNOSIS — M064 Inflammatory polyarthropathy: Secondary | ICD-10-CM | POA: Diagnosis not present

## 2021-08-06 DIAGNOSIS — Z681 Body mass index (BMI) 19 or less, adult: Secondary | ICD-10-CM | POA: Diagnosis not present

## 2021-08-06 DIAGNOSIS — M79642 Pain in left hand: Secondary | ICD-10-CM | POA: Diagnosis not present

## 2021-08-06 DIAGNOSIS — M5136 Other intervertebral disc degeneration, lumbar region: Secondary | ICD-10-CM | POA: Diagnosis not present

## 2021-08-06 DIAGNOSIS — M79641 Pain in right hand: Secondary | ICD-10-CM | POA: Diagnosis not present

## 2021-08-06 DIAGNOSIS — R5383 Other fatigue: Secondary | ICD-10-CM | POA: Diagnosis not present

## 2021-08-07 ENCOUNTER — Ambulatory Visit: Payer: Medicare Other | Admitting: "Endocrinology

## 2021-08-07 DIAGNOSIS — R319 Hematuria, unspecified: Secondary | ICD-10-CM | POA: Diagnosis not present

## 2021-08-07 DIAGNOSIS — E871 Hypo-osmolality and hyponatremia: Secondary | ICD-10-CM | POA: Diagnosis not present

## 2021-08-08 DIAGNOSIS — M5136 Other intervertebral disc degeneration, lumbar region: Secondary | ICD-10-CM | POA: Diagnosis not present

## 2021-08-08 DIAGNOSIS — E871 Hypo-osmolality and hyponatremia: Secondary | ICD-10-CM | POA: Diagnosis not present

## 2021-08-08 DIAGNOSIS — M4856XA Collapsed vertebra, not elsewhere classified, lumbar region, initial encounter for fracture: Secondary | ICD-10-CM | POA: Diagnosis not present

## 2021-08-08 DIAGNOSIS — E039 Hypothyroidism, unspecified: Secondary | ICD-10-CM | POA: Diagnosis not present

## 2021-08-08 DIAGNOSIS — J449 Chronic obstructive pulmonary disease, unspecified: Secondary | ICD-10-CM | POA: Diagnosis not present

## 2021-08-08 DIAGNOSIS — Z681 Body mass index (BMI) 19 or less, adult: Secondary | ICD-10-CM | POA: Diagnosis not present

## 2021-08-08 DIAGNOSIS — I10 Essential (primary) hypertension: Secondary | ICD-10-CM | POA: Diagnosis not present

## 2021-08-30 DIAGNOSIS — Z681 Body mass index (BMI) 19 or less, adult: Secondary | ICD-10-CM | POA: Diagnosis not present

## 2021-08-30 DIAGNOSIS — M5136 Other intervertebral disc degeneration, lumbar region: Secondary | ICD-10-CM | POA: Diagnosis not present

## 2021-08-30 DIAGNOSIS — M79642 Pain in left hand: Secondary | ICD-10-CM | POA: Diagnosis not present

## 2021-08-30 DIAGNOSIS — M1120 Other chondrocalcinosis, unspecified site: Secondary | ICD-10-CM | POA: Diagnosis not present

## 2021-08-30 DIAGNOSIS — M1991 Primary osteoarthritis, unspecified site: Secondary | ICD-10-CM | POA: Diagnosis not present

## 2021-08-30 DIAGNOSIS — M79641 Pain in right hand: Secondary | ICD-10-CM | POA: Diagnosis not present

## 2021-08-30 DIAGNOSIS — M064 Inflammatory polyarthropathy: Secondary | ICD-10-CM | POA: Diagnosis not present

## 2021-09-15 DIAGNOSIS — M545 Low back pain, unspecified: Secondary | ICD-10-CM | POA: Diagnosis not present

## 2021-09-27 DIAGNOSIS — L820 Inflamed seborrheic keratosis: Secondary | ICD-10-CM | POA: Diagnosis not present

## 2021-09-27 DIAGNOSIS — L821 Other seborrheic keratosis: Secondary | ICD-10-CM | POA: Diagnosis not present

## 2021-09-27 DIAGNOSIS — Z85828 Personal history of other malignant neoplasm of skin: Secondary | ICD-10-CM | POA: Diagnosis not present

## 2021-09-27 DIAGNOSIS — D225 Melanocytic nevi of trunk: Secondary | ICD-10-CM | POA: Diagnosis not present

## 2021-09-27 DIAGNOSIS — Z08 Encounter for follow-up examination after completed treatment for malignant neoplasm: Secondary | ICD-10-CM | POA: Diagnosis not present

## 2021-10-04 ENCOUNTER — Ambulatory Visit: Payer: Medicare Other | Admitting: "Endocrinology

## 2021-10-16 DIAGNOSIS — E890 Postprocedural hypothyroidism: Secondary | ICD-10-CM | POA: Diagnosis not present

## 2021-10-17 ENCOUNTER — Ambulatory Visit: Payer: Medicare Other | Admitting: "Endocrinology

## 2021-10-17 LAB — TSH: TSH: 4.56 u[IU]/mL — ABNORMAL HIGH (ref 0.450–4.500)

## 2021-10-17 LAB — T4, FREE: Free T4: 1.51 ng/dL (ref 0.82–1.77)

## 2021-10-18 ENCOUNTER — Ambulatory Visit: Payer: Medicare Other | Admitting: "Endocrinology

## 2021-10-18 ENCOUNTER — Encounter: Payer: Self-pay | Admitting: "Endocrinology

## 2021-10-18 VITALS — BP 130/64 | HR 88 | Ht 63.0 in | Wt 108.2 lb

## 2021-10-18 DIAGNOSIS — E890 Postprocedural hypothyroidism: Secondary | ICD-10-CM | POA: Diagnosis not present

## 2021-10-18 MED ORDER — LEVOTHYROXINE SODIUM 75 MCG PO TABS: 75.0000 ug | ORAL_TABLET | Freq: Every day | ORAL | 1 refills | Status: DC

## 2021-10-18 NOTE — Progress Notes (Signed)
10/18/2021, 1:29 PM  Endocrinology follow-up note   Subjective:    Patient ID: Belinda Clark, female    DOB: [birth year], PCP Redmond School, MD   Past Medical History:  Diagnosis Date   Anxiety    Arthritis    Asthma    Carotid artery stenosis    40-59 % -Left   GERD (gastroesophageal reflux disease)    Glaucoma    High blood pressure    HTN (hypertension)    Hyperlipidemia    Hypothyroidism    PONV (postoperative nausea and vomiting)    Past Surgical History:  Procedure Laterality Date   BREAST BIOPSY     x3   CATARACT EXTRACTION W/ INTRAOCULAR LENS  IMPLANT, BILATERAL     COLONOSCOPY N/A 10/05/2013   Procedure: COLONOSCOPY;  Surgeon: Jamesetta So, MD;  Location: [facility];  Service: Gastroenterology;  Laterality: N/A;   ENDARTERECTOMY Left 05/19/2017   Procedure: ENDARTERECTOMY CAROTID LEFT;  Surgeon: Rosetta Posner, MD;  Location: [place];  Service: Vascular;  Laterality: Left;   FRACTURE SURGERY     Spinal Fx 06/30/11   NECK MASS EXCISION     PATCH ANGIOPLASTY Left 05/19/2017   Procedure: PATCH ANGIOPLASTY LEFT CAROTID ARTERY USING HEMASHIELD PLATINUM FINESSE PATCH;  Surgeon: Rosetta Posner, MD;  Location: [place];  Service: Vascular;  Laterality: Left;   RHINOPLASTY     3 total surgeries   SPINE SURGERY  06/30/2011   pt states surgery was 2002    THYROIDECTOMY     TONSILLECTOMY     Social History   Socioeconomic History   Marital status: Widowed    Spouse name: Not on file   Number of children: Not on file   Years of education: Not on file   Highest education level: Not on file  Occupational History   Not on file  Tobacco Use   Smoking status: Former    Packs/day: 1.00    Years: 30.00    Total pack years: 30.00    Types: Cigarettes    Quit date: 12/26/1993    Years since quitting: 27.8   Smokeless tobacco: Never  Vaping Use   Vaping Use: Never used  Substance and Sexual Activity   Alcohol use:  No   Drug use: No   Sexual activity: Not on file  Other Topics Concern   Not on file  Social History Narrative   Not on file   Social Determinants of Health   Financial Resource Strain: Not on file  Food Insecurity: Not on file  Transportation Needs: Not on file  Physical Activity: Not on file  Stress: Not on file  Social Connections: Not on file   Family History  Problem Relation Age of Onset   Diabetes Mother    Hypertension Mother    Hyperlipidemia Mother    Heart attack Father    Heart disease Father    Hyperlipidemia Father    Heart disease Other    Diabetes Other    Stroke Brother    Heart disease Brother    Heart attack Brother    Hyperlipidemia Brother    Stroke Maternal Grandmother    Outpatient Encounter Medications as of 10/18/2021  Medication Sig   levothyroxine (SYNTHROID) 75 MCG tablet Take 1 tablet (75 mcg total) by mouth daily.   albuterol (VENTOLIN HFA) 108 (90 Base) MCG/ACT inhaler Inhale into the lungs every 6 (six) hours as needed for wheezing or shortness of breath.   amLODipine (NORVASC) 5 MG tablet Take 5 mg by mouth daily.   Ascorbic Acid (VITAMIN C) 1000 MG tablet Take 1,000 mg by mouth at bedtime.   atorvastatin (LIPITOR) 40 MG tablet Take 40 mg by mouth every evening.   calcium carbonate (OS-CAL) 600 MG TABS Take 1,200 mg by mouth at bedtime.    calcium carbonate (TUMS - DOSED IN MG ELEMENTAL CALCIUM) 500 MG chewable tablet Chew 1 tablet by mouth daily as needed for indigestion or heartburn. (Patient not taking: Reported on 07/02/2021)   chlorthalidone (HYGROTON) 25 MG tablet Take 25 mg by mouth daily.   Cholecalciferol 25 MCG (1000 UT) tablet Take 2,000 Units by mouth at bedtime.    clopidogrel (PLAVIX) 75 MG tablet Take 75 mg by mouth daily.   enalapril (VASOTEC) 20 MG tablet Take 2 tablets (40 mg total) by mouth daily.   folic acid (FOLVITE) 935 MCG tablet Take 800 mcg by mouth at bedtime.   Garlic 7017 MG CAPS Take 1,000 mg by mouth at  bedtime.   ibuprofen (ADVIL,MOTRIN) 200 MG tablet Take 400 mg by mouth every 4 (four) hours as needed for pain.    LORazepam (ATIVAN) 0.5 MG tablet Take 0.5 mg by mouth 3 (three) times daily as needed for sleep.    Methylcobalamin (B-12) 5000 MCG TBDP Take 5,000 mcg by mouth at bedtime.   metoprolol succinate (TOPROL-XL) 25 MG 24 hr tablet Take 50 mg by mouth daily.   montelukast (SINGULAIR) 10 MG tablet Take 10 mg by mouth daily.   Multiple Vitamin (MULTIVITAMIN) capsule Take 1 capsule by mouth daily.   Omega 3 1000 MG CAPS Take 1,000 mg by mouth at bedtime.   omeprazole (PRILOSEC) 20 MG capsule Take 20 mg by mouth 2 (two) times daily.   PROAIR HFA 108 (90 BASE) MCG/ACT inhaler Inhale 2 puffs into the lungs every 6 (six) hours as needed for wheezing or shortness of breath.    pyridOXINE (VITAMIN B-6) 100 MG tablet Take 100 mg by mouth daily.   traZODone (DESYREL) 50 MG tablet Take 50 mg by mouth at bedtime. Take 3 tablets qhs (Patient not taking: Reported on 07/02/2021)   vitamin E 400 UNIT capsule Take 400 Units by mouth at bedtime.   zoledronic acid (RECLAST) 5 MG/100ML SOLN injection Inject 5 mg into the vein once.   [DISCONTINUED] SYNTHROID 88 MCG tablet Take 1 tablet (88 mcg total) by mouth daily before breakfast.   No facility-administered encounter medications on file as of 10/18/2021.   ALLERGIES: Allergies  Allergen Reactions   Ciprofloxacin Hives   Cleocin  [Clindamycin] Hives   Cleocin [Clindamycin Hcl] Hives   Clindamycin Hcl Hives   Iodinated Contrast Media Hives    Iohexol   Sulfa Antibiotics Hives   Ceftin  [Cefuroxime] Hives   Cefuroxime Axetil    Clarithromycin    Clindamycin Phosphate    Codeine    Cymbalta [Duloxetine Hcl]    Doxycycline    Iohexol      Desc: hives    Levofloxacin    Metoclopramide Hcl    Other    Penicillin G    Penicillins     VACCINATION STATUS: Immunization History  Administered Date(s) Administered   Influenza, High  Dose Seasonal  PF 12/15/2016, 10/28/2018    HPI Belinda Clark is 81 y.o. female who presents today with a medical history as above. she is being seen in f/u after she was seen in consultation for postsurgical hypothyroidism requested by Redmond School, MD.  See note from last visit. She came alone today.   She underwent total thyroidectomy for what appears to be hyperthyroidism in 1973.  She recalls surgical findings were benign.  She was given various doses of thyroid hormone replacement over the years with various results.   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 Her dose was subsequently lowered to 88 mcg p.o. daily before breakfast.  She presents with symptomatic improvement as well as biochemical improvement.  She still complains of anxiety.  Her previsit labs show improved TSH and free T4.  She has  steady weight at 108 pounds.   She sleeps better, less anxious.   She takes her thyroid hormone by itself. She did not have any problem with the generic levothyroxine as far as side effects. She denies any family history of thyroid dysfunction or thyroid malignancy.  She denies dysphagia, shortness of breath, nor voice change at this time.  Review of Systems  Constitutional: + Fluctuating  body weight + fatigue, - subjective hyperthermia. Eyes: no blurry vision, no xerophthalmia, she denies palpitations, tremor, heat intolerance. ENT: no sore throat, no nodules palpated in throat, no dysphagia/odynophagia, no hoarseness   Objective:       10/18/2021    1:09 PM 07/02/2021   10:45 AM 05/07/2021    1:04 PM  Vitals with BMI  Height '5\' 3"'$  '5\' 3"'$  '5\' 3"'$   Weight 108 lbs 3 oz 110 lbs 3 oz 111 lbs 10 oz  BMI 19.17 61.44 31.54  Systolic 008 676 195  Diastolic 64 66 74  Pulse 88 88 68    BP 130/64   Pulse 88   Ht '5\' 3"'$  (1.6 m)   Wt 108 lb 3.2 oz (49.1 kg)   BMI 19.17 kg/m   Wt Readings from Last 3 Encounters:  10/18/21 108 lb 3.2 oz (49.1 kg)  07/02/21 110 lb 3.2  oz (50 kg)  05/07/21 111 lb 9.6 oz (50.6 kg)    Physical Exam  Constitutional:  Body mass index is 19.17 kg/m.,  not in acute distress, ++ stable  state of mind Eyes: PERRLA, EOMI, no exophthalmos   CMP ( most recent) CMP    Recent Results (from the past 2160 hour(s))  TSH     Status: Abnormal   Collection Time: 10/16/21 11:48 AM  Result Value Ref Range   TSH 4.560 (H) 0.450 - 4.500 uIU/mL  T4, free     Status: None   Collection Time: 10/16/21 11:48 AM  Result Value Ref Range   Free T4 1.51 0.82 - 1.77 ng/dL     Assessment & Plan:   1. Postsurgical hypothyroidism  -Her thyroid function tests are such that her free T4 is on target, TSH improving but at 4.6.  Due to her clinical anxiety I discussed and lowered her levothyroxine to 75 mcg p.o. daily before breakfast.     - We discussed about the correct intake of her thyroid hormone, on empty stomach at fasting, with water, separated by at least 30 minutes from breakfast and other medications,  and separated by more than 4 hours from calcium, iron, multivitamins, acid reflux medications (PPIs). -Patient is made aware of the fact that thyroid hormone  replacement is needed for life, dose to be adjusted by periodic monitoring of thyroid function tests.   - she will need a repeat,  more complete thyroid function tests before her next visit in 12 weeks.  -In the absence of palpable thyroid remnant, she will not need thyroid imaging at this time.   - she is advised to maintain close follow up with Redmond School, MD for primary care needs.    I spent 21 minutes in the care of the patient today including review of labs from Thyroid Function, CMP, and other relevant labs ; imaging/biopsy records (current and previous including abstractions from other facilities); face-to-face time discussing  her lab results and symptoms, medications doses, her options of short and long term treatment based on the latest standards of care /  guidelines;   and documenting the encounter.  Belinda Clark  participated in the discussions, expressed understanding, and voiced agreement with the above plans.  All questions were answered to her satisfaction. she is encouraged to contact clinic should she have any questions or concerns prior to her return visit.    Follow up plan: Return in about 3 months (around 01/18/2022) for F/U with Pre-visit Labs.   Glade Lloyd, MD [facility] [address] [place], [place] [address] Phone: [phone]  Fax: [phone]     10/18/2021, 1:29 PM  This note was partially dictated with voice recognition software. Similar sounding words can be transcribed inadequately or may not  be corrected upon review.

## 2021-11-06 DIAGNOSIS — E0500 Thyrotoxicosis with diffuse goiter without thyrotoxic crisis or storm: Secondary | ICD-10-CM | POA: Diagnosis not present

## 2021-11-06 DIAGNOSIS — M810 Age-related osteoporosis without current pathological fracture: Secondary | ICD-10-CM | POA: Diagnosis not present

## 2021-11-06 DIAGNOSIS — E890 Postprocedural hypothyroidism: Secondary | ICD-10-CM | POA: Diagnosis not present

## 2021-11-07 ENCOUNTER — Telehealth: Payer: Self-pay | Admitting: "Endocrinology

## 2021-11-07 NOTE — Telephone Encounter (Signed)
Received medical records request from [facility]. Sent released to [place].

## 2021-11-13 ENCOUNTER — Telehealth: Payer: Self-pay

## 2021-11-13 NOTE — Telephone Encounter (Signed)
Pt called to schedule appt in [place] for R foot/ ankle swelling x 1 month. She has been seen there in the past for carotid stenosis. She denies any color changes to the area, no pain and no varicose veins. I have sent MD a message as to where he would like her to be scheduled to be seen. Will schedule once I hear back. Pt is aware.

## 2021-11-20 ENCOUNTER — Other Ambulatory Visit: Payer: Self-pay

## 2021-11-20 DIAGNOSIS — M7989 Other specified soft tissue disorders: Secondary | ICD-10-CM

## 2021-11-28 ENCOUNTER — Encounter: Payer: Self-pay | Admitting: Vascular Surgery

## 2021-11-28 ENCOUNTER — Ambulatory Visit (INDEPENDENT_AMBULATORY_CARE_PROVIDER_SITE_OTHER): Payer: Medicare Other

## 2021-11-28 ENCOUNTER — Ambulatory Visit: Payer: Medicare Other | Admitting: Vascular Surgery

## 2021-11-28 VITALS — BP 132/74 | HR 80 | Temp 98.1°F | Ht 63.0 in | Wt 108.4 lb

## 2021-11-28 DIAGNOSIS — M7989 Other specified soft tissue disorders: Secondary | ICD-10-CM

## 2021-11-28 NOTE — Progress Notes (Signed)
[facility]  Patient name: Belinda Clark MRN: [id number] DOB: 1940/06/16 Sex: female  REASON FOR VISIT: Evaluation of right leg swelling  HPI: Belinda Clark is a 81 y.o. female well-known to me from prior carotid endarterectomy.  She called our office reporting right leg swelling.  She reports that this occurred spontaneously.  She did report some mild left leg swelling but much more so on the right.  This was from her knee distally onto her foot.  This has since resolved.  She also reports some discomfort over the medial aspect of her calf associated with this.  She denies any trauma.  Past Medical History:  Diagnosis Date   Anxiety    Arthritis    Asthma    Carotid artery stenosis    40-59 % -Left   GERD (gastroesophageal reflux disease)    Glaucoma    High blood pressure    HTN (hypertension)    Hyperlipidemia    Hypothyroidism    PONV (postoperative nausea and vomiting)     Family History  Problem Relation Age of Onset   Diabetes Mother    Hypertension Mother    Hyperlipidemia Mother    Heart attack Father    Heart disease Father    Hyperlipidemia Father    Heart disease Other    Diabetes Other    Stroke Brother    Heart disease Brother    Heart attack Brother    Hyperlipidemia Brother    Stroke Maternal Grandmother     SOCIAL HISTORY: Social History   Tobacco Use   Smoking status: Former    Packs/day: 1.00    Years: 30.00    Total pack years: 30.00    Types: Cigarettes    Quit date: 12/26/1993    Years since quitting: 27.9   Smokeless tobacco: Never  Substance Use Topics   Alcohol use: No    Allergies  Allergen Reactions   Ciprofloxacin Hives   Cleocin  [Clindamycin] Hives   Cleocin [Clindamycin Hcl] Hives   Clindamycin Hcl Hives   Iodinated Contrast Media Hives    Iohexol   Sulfa Antibiotics Hives   Ceftin  [Cefuroxime] Hives   Cefuroxime Axetil    Clarithromycin     Clindamycin Phosphate    Codeine    Cymbalta [Duloxetine Hcl]    Doxycycline    Iohexol      Desc: hives    Levofloxacin    Metoclopramide Hcl    Other    Penicillin G    Penicillins     Current Outpatient Medications  Medication Sig Dispense Refill   amLODipine (NORVASC) 5 MG tablet Take 5 mg by mouth daily.     Ascorbic Acid (VITAMIN C) 1000 MG tablet Take 1,000 mg by mouth at bedtime.     atorvastatin (LIPITOR) 40 MG tablet Take 40 mg by mouth every evening.     calcium carbonate (OS-CAL) 600 MG TABS Take 1,200 mg by mouth at bedtime.      calcium carbonate (TUMS - DOSED IN MG ELEMENTAL CALCIUM) 500 MG chewable tablet Chew 1 tablet by mouth daily as needed for indigestion or heartburn.     Cholecalciferol 25 MCG (1000 UT) tablet Take 2,000 Units by mouth at bedtime.      clopidogrel (PLAVIX) 75 MG tablet Take 75 mg by mouth daily.     enalapril (VASOTEC) 20 MG tablet Take 2 tablets (40 mg total) by mouth daily. 180 tablet 3  folic acid (FOLVITE) 945 MCG tablet Take 800 mcg by mouth at bedtime.     Garlic 0388 MG CAPS Take 1,000 mg by mouth at bedtime.     ibuprofen (ADVIL,MOTRIN) 200 MG tablet Take 400 mg by mouth every 4 (four) hours as needed for pain.      levothyroxine (SYNTHROID) 100 MCG tablet Take 100 mcg by mouth every morning.     LORazepam (ATIVAN) 0.5 MG tablet Take 0.5 mg by mouth 3 (three) times daily as needed for sleep.      Methylcobalamin (B-12) 5000 MCG TBDP Take 5,000 mcg by mouth at bedtime.     metoprolol succinate (TOPROL-XL) 25 MG 24 hr tablet Take 50 mg by mouth daily.     montelukast (SINGULAIR) 10 MG tablet Take 10 mg by mouth daily.     Multiple Vitamin (MULTIVITAMIN) capsule Take 1 capsule by mouth daily.     Omega 3 1000 MG CAPS Take 1,000 mg by mouth at bedtime.     omeprazole (PRILOSEC) 20 MG capsule Take 20 mg by mouth 2 (two) times daily.     PROAIR HFA 108 (90 BASE) MCG/ACT inhaler Inhale 2 puffs into the lungs every 6 (six) hours as needed for  wheezing or shortness of breath.      pyridOXINE (VITAMIN B-6) 100 MG tablet Take 100 mg by mouth daily.     traZODone (DESYREL) 50 MG tablet Take 50 mg by mouth at bedtime. Take 3 tablets qhs     vitamin E 400 UNIT capsule Take 400 Units by mouth at bedtime.     zoledronic acid (RECLAST) 5 MG/100ML SOLN injection Inject 5 mg into the vein once.     albuterol (VENTOLIN HFA) 108 (90 Base) MCG/ACT inhaler Inhale into the lungs every 6 (six) hours as needed for wheezing or shortness of breath. (Patient not taking: Reported on 11/28/2021)     chlorthalidone (HYGROTON) 25 MG tablet Take 25 mg by mouth daily. (Patient not taking: Reported on 11/28/2021)     No current facility-administered medications for this visit.    REVIEW OF SYSTEMS:  '[X]'$  denotes positive finding, '[ ]'$  denotes negative finding Cardiac  Comments:  Chest pain or chest pressure:    Shortness of breath upon exertion:    Short of breath when lying flat:    Irregular heart rhythm:        Vascular    Pain in calf, thigh, or hip brought on by ambulation:    Pain in feet at night that wakes you up from your sleep:     Blood clot in your veins:    Leg swelling:  x         PHYSICAL EXAM: Vitals:   11/28/21 1109  BP: 132/74  Pulse: 80  Temp: 98.1 F (36.7 C)  Weight: 108 lb 6.4 oz (49.2 kg)  Height: '5\' 3"'$  (1.6 m)    GENERAL: The patient is a well-nourished female, in no acute distress. The vital signs are documented above. CARDIOVASCULAR: 2+ posterior tibial pulses bilaterally.  No tenderness in her calves bilaterally and no notable swelling. PULMONARY: There is good air exchange  MUSCULOSKELETAL: There are no major deformities or cyanosis. NEUROLOGIC: No focal weakness or paresthesias are detected. SKIN: There are no ulcers or rashes noted. PSYCHIATRIC: The patient has a normal affect.  DATA:  Duplex in our office shows no evidence of right leg DVT  MEDICAL ISSUES: I discussed this with the patient.  She has a  completely normal  arterial physical exam and no evidence of DVT by duplex.  This was mostly right leg but does report some left leg swelling as well.  I explained that if this is progressive she may need to speak with Dr. Gerarda Fraction regarding diuretic.  I will see her as scheduled in March 2024 for carotid duplex    Rosetta Posner, MD FACS [facility] [phone]  Note: Portions of this report may have been transcribed using voice recognition software.  Every effort has been made to ensure accuracy; however, inadvertent computerized transcription errors may still be present.

## 2021-12-10 ENCOUNTER — Other Ambulatory Visit: Payer: Self-pay | Admitting: "Endocrinology

## 2021-12-13 ENCOUNTER — Other Ambulatory Visit (HOSPITAL_COMMUNITY): Payer: Self-pay | Admitting: Internal Medicine

## 2021-12-13 DIAGNOSIS — J209 Acute bronchitis, unspecified: Secondary | ICD-10-CM | POA: Diagnosis not present

## 2021-12-13 DIAGNOSIS — J449 Chronic obstructive pulmonary disease, unspecified: Secondary | ICD-10-CM | POA: Diagnosis not present

## 2021-12-13 DIAGNOSIS — E559 Vitamin D deficiency, unspecified: Secondary | ICD-10-CM | POA: Diagnosis not present

## 2021-12-13 DIAGNOSIS — I10 Essential (primary) hypertension: Secondary | ICD-10-CM | POA: Diagnosis not present

## 2021-12-13 DIAGNOSIS — J441 Chronic obstructive pulmonary disease with (acute) exacerbation: Secondary | ICD-10-CM | POA: Diagnosis not present

## 2021-12-13 DIAGNOSIS — E063 Autoimmune thyroiditis: Secondary | ICD-10-CM | POA: Diagnosis not present

## 2021-12-13 DIAGNOSIS — Z681 Body mass index (BMI) 19 or less, adult: Secondary | ICD-10-CM | POA: Diagnosis not present

## 2021-12-17 DIAGNOSIS — K4090 Unilateral inguinal hernia, without obstruction or gangrene, not specified as recurrent: Secondary | ICD-10-CM | POA: Diagnosis not present

## 2021-12-17 DIAGNOSIS — E063 Autoimmune thyroiditis: Secondary | ICD-10-CM | POA: Diagnosis not present

## 2021-12-17 DIAGNOSIS — Z681 Body mass index (BMI) 19 or less, adult: Secondary | ICD-10-CM | POA: Diagnosis not present

## 2021-12-17 DIAGNOSIS — I10 Essential (primary) hypertension: Secondary | ICD-10-CM | POA: Diagnosis not present

## 2021-12-17 DIAGNOSIS — J449 Chronic obstructive pulmonary disease, unspecified: Secondary | ICD-10-CM | POA: Diagnosis not present

## 2021-12-18 ENCOUNTER — Ambulatory Visit: Payer: Medicare Other | Admitting: General Surgery

## 2021-12-18 ENCOUNTER — Encounter: Payer: Self-pay | Admitting: General Surgery

## 2021-12-18 VITALS — BP 164/91 | HR 74 | Temp 99.1°F | Resp 12 | Ht 63.0 in | Wt 107.0 lb

## 2021-12-18 DIAGNOSIS — K4090 Unilateral inguinal hernia, without obstruction or gangrene, not specified as recurrent: Secondary | ICD-10-CM

## 2021-12-19 NOTE — Progress Notes (Signed)
Belinda Clark; [id number]; [birth year]   HPI Patient is a 81 year old white female who was referred to my care by Dr. Riley Kill for evaluation and treatment of a right middle hernia.  She states that she started feeling a lump in the right groin region over the past few months.  It is made worse with straining.  She denies any nausea or vomiting.  She has never had surgery in that area.  She does have multiple medical problems and chronically takes Plavix. Past Medical History:  Diagnosis Date   Anxiety    Arthritis    Asthma    Carotid artery stenosis    40-59 % -Left   GERD (gastroesophageal reflux disease)    Glaucoma    High blood pressure    HTN (hypertension)    Hyperlipidemia    Hypothyroidism    PONV (postoperative nausea and vomiting)     Past Surgical History:  Procedure Laterality Date   BREAST BIOPSY     x3   CATARACT EXTRACTION W/ INTRAOCULAR LENS  IMPLANT, BILATERAL     COLONOSCOPY N/A 10/05/2013   Procedure: COLONOSCOPY;  Surgeon: Jamesetta So, MD;  [facility];  Service: Gastroenterology;  Laterality: N/A;   ENDARTERECTOMY Left 05/19/2017   Procedure: ENDARTERECTOMY CAROTID LEFT;  Surgeon: Rosetta Posner, MD;  Location: [facility] OR;  Service: Vascular;  Laterality: Left;   FRACTURE SURGERY     Spinal Fx 06/30/11   NECK MASS EXCISION     PATCH ANGIOPLASTY Left 05/19/2017   Procedure: PATCH ANGIOPLASTY LEFT CAROTID ARTERY USING HEMASHIELD PLATINUM FINESSE PATCH;  Surgeon: Rosetta Posner, MD;  Location: [facility] OR;  Service: Vascular;  Laterality: Left;   RHINOPLASTY     3 total surgeries   SPINE SURGERY  06/30/2011   pt states surgery was 2002    THYROIDECTOMY     TONSILLECTOMY      Family History  Problem Relation Age of Onset   Diabetes Mother    Hypertension Mother    Hyperlipidemia Mother    Heart attack Father    Heart disease Father    Hyperlipidemia Father    Heart disease Other    Diabetes Other    Stroke Brother    Heart disease Brother    Heart  attack Brother    Hyperlipidemia Brother    Stroke Maternal Grandmother     Current Outpatient Medications on File Prior to Visit  Medication Sig Dispense Refill   amLODipine (NORVASC) 5 MG tablet Take 5 mg by mouth daily.     Ascorbic Acid (VITAMIN C) 1000 MG tablet Take 1,000 mg by mouth at bedtime.     atorvastatin (LIPITOR) 40 MG tablet Take 40 mg by mouth every evening.     calcium carbonate (OS-CAL) 600 MG TABS Take 1,200 mg by mouth at bedtime.      calcium carbonate (TUMS - DOSED IN MG ELEMENTAL CALCIUM) 500 MG chewable tablet Chew 1 tablet by mouth daily as needed for indigestion or heartburn.     Cholecalciferol 25 MCG (1000 UT) tablet Take 2,000 Units by mouth at bedtime.      clopidogrel (PLAVIX) 75 MG tablet Take 75 mg by mouth daily.     enalapril (VASOTEC) 20 MG tablet Take 2 tablets (40 mg total) by mouth daily. 409 tablet 3   folic acid (FOLVITE) 811 MCG tablet Take 800 mcg by mouth at bedtime.     Garlic 9147 MG CAPS Take 1,000 mg by mouth at bedtime.  ibuprofen (ADVIL,MOTRIN) 200 MG tablet Take 400 mg by mouth every 4 (four) hours as needed for pain.      levothyroxine (SYNTHROID) 100 MCG tablet Take 100 mcg by mouth every morning.     levothyroxine (SYNTHROID) 75 MCG tablet Take 1 tablet (75 mcg total) by mouth daily before breakfast. 90 tablet 3   LORazepam (ATIVAN) 0.5 MG tablet Take 0.5 mg by mouth 3 (three) times daily as needed for sleep.      Methylcobalamin (B-12) 5000 MCG TBDP Take 5,000 mcg by mouth at bedtime.     metoprolol succinate (TOPROL-XL) 25 MG 24 hr tablet Take 50 mg by mouth daily.     montelukast (SINGULAIR) 10 MG tablet Take 10 mg by mouth daily.     Multiple Vitamin (MULTIVITAMIN) capsule Take 1 capsule by mouth daily.     Omega 3 1000 MG CAPS Take 1,000 mg by mouth at bedtime.     omeprazole (PRILOSEC) 20 MG capsule Take 20 mg by mouth 2 (two) times daily.     PROAIR HFA 108 (90 BASE) MCG/ACT inhaler Inhale 2 puffs into the lungs every 6 (six)  hours as needed for wheezing or shortness of breath.      pyridOXINE (VITAMIN B-6) 100 MG tablet Take 100 mg by mouth daily.     traZODone (DESYREL) 50 MG tablet Take 50 mg by mouth at bedtime. Take 3 tablets qhs     vitamin E 400 UNIT capsule Take 400 Units by mouth at bedtime.     zoledronic acid (RECLAST) 5 MG/100ML SOLN injection Inject 5 mg into the vein once.     albuterol (VENTOLIN HFA) 108 (90 Base) MCG/ACT inhaler Inhale into the lungs every 6 (six) hours as needed for wheezing or shortness of breath. (Patient not taking: Reported on 11/28/2021)     chlorthalidone (HYGROTON) 25 MG tablet Take 25 mg by mouth daily. (Patient not taking: Reported on 11/28/2021)     No current facility-administered medications on file prior to visit.    Allergies  Allergen Reactions   Ciprofloxacin Hives   Cleocin  [Clindamycin] Hives   Cleocin [Clindamycin Hcl] Hives   Clindamycin Hcl Hives   Iodinated Contrast Media Hives    Iohexol   Sulfa Antibiotics Hives   Ceftin  [Cefuroxime] Hives   Cefuroxime Axetil    Clarithromycin    Clindamycin Phosphate    Codeine    Cymbalta [Duloxetine Hcl]    Doxycycline    Iohexol      Desc: hives    Levofloxacin    Metoclopramide Hcl    Other    Penicillin G    Penicillins     Social History   Substance and Sexual Activity  Alcohol Use No    Social History   Tobacco Use  Smoking Status Former   Packs/day: 1.00   Years: 30.00   Total pack years: 30.00   Types: Cigarettes   Quit date: 12/26/1993   Years since quitting: 28.0  Smokeless Tobacco Never    Review of Systems  Constitutional: Negative.   HENT:  Positive for sinus pain.   Eyes: Negative.   Respiratory: Negative.    Cardiovascular: Negative.   Gastrointestinal:  Positive for heartburn.  Genitourinary: Negative.   Musculoskeletal:  Positive for back pain and joint pain.  Skin: Negative.   Neurological: Negative.   Endo/Heme/Allergies: Negative.   Psychiatric/Behavioral:  Negative.      Objective   Vitals:   12/18/21 1519  BP: (!) 164/91  Pulse: 74  Resp: 12  Temp: 99.1 F (37.3 C)  SpO2: 93%    Physical Exam Vitals reviewed.  Constitutional:      Appearance: Normal appearance. She is normal weight. She is not ill-appearing.  HENT:     Head: Normocephalic and atraumatic.  Cardiovascular:     Rate and Rhythm: Normal rate and regular rhythm.     Heart sounds: Normal heart sounds. No murmur heard.    No friction rub. No gallop.  Pulmonary:     Effort: Pulmonary effort is normal. No respiratory distress.     Breath sounds: Normal breath sounds. No stridor. No wheezing, rhonchi or rales.  Abdominal:     General: Abdomen is flat. Bowel sounds are normal. There is no distension.     Palpations: Abdomen is soft. There is no mass.     Tenderness: There is no abdominal tenderness. There is no guarding or rebound.     Hernia: A hernia is present.     Comments: Small easily reducible right inguinal hernia is noted.  Skin:    General: Skin is warm and dry.  Neurological:     Mental Status: She is alert and oriented to person, place, and time.     Assessment  Right inguinal hernia.  At this point, this does not seem to be affecting her daily lifestyle.  Given her multiple comorbidities, I told her that I would not immediately pursue repair of the right anal hernia.  She understands and agrees. Plan  I told her to just follow it for the next few months.  Should he get worse or cause more discomfort, she should return to my care for reevaluation and possible consideration for surgical repair.  Literature was given.

## 2021-12-24 DIAGNOSIS — E0500 Thyrotoxicosis with diffuse goiter without thyrotoxic crisis or storm: Secondary | ICD-10-CM | POA: Diagnosis not present

## 2021-12-24 DIAGNOSIS — E890 Postprocedural hypothyroidism: Secondary | ICD-10-CM | POA: Diagnosis not present

## 2021-12-25 DIAGNOSIS — E0500 Thyrotoxicosis with diffuse goiter without thyrotoxic crisis or storm: Secondary | ICD-10-CM | POA: Diagnosis not present

## 2021-12-25 DIAGNOSIS — Z87898 Personal history of other specified conditions: Secondary | ICD-10-CM | POA: Diagnosis not present

## 2021-12-25 DIAGNOSIS — E890 Postprocedural hypothyroidism: Secondary | ICD-10-CM | POA: Diagnosis not present

## 2021-12-25 DIAGNOSIS — Z8781 Personal history of (healed) traumatic fracture: Secondary | ICD-10-CM | POA: Diagnosis not present

## 2021-12-25 DIAGNOSIS — M810 Age-related osteoporosis without current pathological fracture: Secondary | ICD-10-CM | POA: Diagnosis not present

## 2022-01-01 DIAGNOSIS — M79641 Pain in right hand: Secondary | ICD-10-CM | POA: Diagnosis not present

## 2022-01-01 DIAGNOSIS — M1120 Other chondrocalcinosis, unspecified site: Secondary | ICD-10-CM | POA: Diagnosis not present

## 2022-01-01 DIAGNOSIS — Z681 Body mass index (BMI) 19 or less, adult: Secondary | ICD-10-CM | POA: Diagnosis not present

## 2022-01-01 DIAGNOSIS — M79642 Pain in left hand: Secondary | ICD-10-CM | POA: Diagnosis not present

## 2022-01-01 DIAGNOSIS — M064 Inflammatory polyarthropathy: Secondary | ICD-10-CM | POA: Diagnosis not present

## 2022-01-01 DIAGNOSIS — M5136 Other intervertebral disc degeneration, lumbar region: Secondary | ICD-10-CM | POA: Diagnosis not present

## 2022-01-01 DIAGNOSIS — M1991 Primary osteoarthritis, unspecified site: Secondary | ICD-10-CM | POA: Diagnosis not present

## 2022-01-21 ENCOUNTER — Ambulatory Visit: Payer: Medicare Other | Admitting: "Endocrinology

## 2022-01-22 ENCOUNTER — Ambulatory Visit (HOSPITAL_COMMUNITY): Payer: Medicare Other | Attending: Internal Medicine | Admitting: Physical Therapy

## 2022-01-22 DIAGNOSIS — M5459 Other low back pain: Secondary | ICD-10-CM | POA: Insufficient documentation

## 2022-01-22 DIAGNOSIS — M6281 Muscle weakness (generalized): Secondary | ICD-10-CM | POA: Insufficient documentation

## 2022-01-22 NOTE — Therapy (Signed)
OUTPATIENT PHYSICAL THERAPY THORACOLUMBAR EVALUATION   Patient Name: Belinda Clark MRN: [id number] DOB:1940/09/19, 81 y.o., female Today's Date: 01/22/2022  END OF SESSION:  PT End of Session - 01/22/22 1343    Visit Number 1    Number of Visits 3    Date for PT Re-Evaluation 01/25/22    Authorization Type [employer] medicare    Progress Note Due on Visit 3    PT Start Time 1305    PT Stop Time 1340    PT Time Calculation (min) 35 min    Activity Tolerance Patient tolerated treatment well    Behavior During Therapy WFL for tasks assessed/performed             Past Medical History:  Diagnosis Date   Anxiety    Arthritis    Asthma    Carotid artery stenosis    40-59 % -Left   GERD (gastroesophageal reflux disease)    Glaucoma    High blood pressure    HTN (hypertension)    Hyperlipidemia    Hypothyroidism    PONV (postoperative nausea and vomiting)    Past Surgical History:  Procedure Laterality Date   BREAST BIOPSY     x3   CATARACT EXTRACTION W/ INTRAOCULAR LENS  IMPLANT, BILATERAL     COLONOSCOPY N/A 10/05/2013   Procedure: COLONOSCOPY;  Surgeon: Jamesetta So, MD;  [facility]: [facility];  Service: Gastroenterology;  Laterality: N/A;   ENDARTERECTOMY Left 05/19/2017   Procedure: ENDARTERECTOMY CAROTID LEFT;  Surgeon: Rosetta Posner, MD;  Location: [facility] OR;  Service: Vascular;  Laterality: Left;   FRACTURE SURGERY     Spinal Fx 06/30/11   NECK MASS EXCISION     PATCH ANGIOPLASTY Left 05/19/2017   Procedure: PATCH ANGIOPLASTY LEFT CAROTID ARTERY USING HEMASHIELD PLATINUM FINESSE PATCH;  Surgeon: Rosetta Posner, MD;  Location: [place];  Service: Vascular;  Laterality: Left;   RHINOPLASTY     3 total surgeries   SPINE SURGERY  06/30/2011   pt states surgery was 2002    THYROIDECTOMY     TONSILLECTOMY     Patient Active Problem List   Diagnosis Date Noted   Postsurgical hypothyroidism 04/06/2021   Iatrogenic hyperthyroidism 04/06/2021   Primary hypertension  03/15/2021   Status post left carotid endarterectomy 03/15/2021   Degenerative scoliosis in adult patient 03/31/2019   Body mass index (BMI) 19.9 or less, adult 03/31/2019   Low back pain 03/31/2019   Frontal fibrosing alopecia 89/16/9450   Lichen planus 38/88/2800   Compression fracture of lumbar spine, non-traumatic ([place]) 06/23/2018   Hypothyroidism 02/23/2018   Age-related osteoporosis without current pathological fracture 07/07/2017   Lumbar herniated disc 03/12/2017   SOB (shortness of breath) 05/02/2014   Fullness of breast 02/14/2014   Hyperlipidemia 11/04/2012   Coronary artery disease involving native coronary artery 01/04/2011   PVD (peripheral vascular disease) ([place]) 01/04/2011   ANKLE SPRAIN 04/30/2007    PCP: Dr. Gerarda Fraction  REFERRING PROVIDER: Delrae Rend, MD  REFERRING DIAG:  Diagnosis  M81.0 (ICD-10-CM) - Age-related osteoporosis without current pathological fracture    Rationale for Evaluation and Treatment: Rehabilitation  THERAPY DIAG:  Low back pain, muscle weakness   ONSET DATE: chronic   SUBJECTIVE:  SUBJECTIVE STATEMENT: Pt has had low back pain for years.  She has been diagnosed with osteoporosis and referred to skilled PT.  The Md would like her to return to therapy.  She has pain about all the time.  The pain is normally on the right side.  She can sit for 30 minutes. Stand for 20 minutes.  Walking is better. Pt states that she is moving to [place] on Monday.  PERTINENT HISTORY:  OA, osteoporosis, PVD, adult scoliosis, spinal fx in 2013   PAIN:  Are you having pain? Yes: NPRS scale: 5/10, worst pain is a 12; best 5 Pain location: Rt low back  Pain description: constant Aggravating factors: sitting and standing  Relieving factors: lying down   PRECAUTIONS: Other:  osteoporosis.   WEIGHT BEARING RESTRICTIONS: No  FALLS:  Has patient fallen in last 6 months? No  LIVING ENVIRONMENT: Lives with: lives with their family Lives in: House/apartment Stairs: Yes: External: 2 steps; on right going up goes one at a time  Has following equipment at home: None  OCCUPATION: retired   PLOF: Independent  PATIENT GOALS:  less pain and to tolerate activity better.   NEXT MD VISIT: Jan  OBJECTIVE:   DIAGNOSTIC FINDINGS:  osteoporosis  PATIENT SURVEYS:  N/A pt will be moving in 5 days.     COGNITION: Overall cognitive status: Within functional limits for tasks assessed     SENSATION: WFL   POSTURE: rounded shoulders, forward head, decreased lumbar lordosis, and increased thoracic kyphosis  PALPATION: Tight paraspinal mm   LUMBAR ROM:   AROM eval  Flexion Not tested due to osteoporosis   Extension   Right lateral flexion   Left lateral flexion   Right rotation   Left rotation    (Blank rows = not tested)   LOWER EXTREMITY MMT:    MMT Right eval Left eval  Hip flexion 5 4+  Hip extension 3- 3-  Hip abduction 3- 3-  Hip adduction    Hip internal rotation    Hip external rotation    Knee flexion    Knee extension    Ankle dorsiflexion    Ankle plantarflexion    Ankle inversion    Ankle eversion     (Blank rows = not tested)    FUNCTIONAL TESTS:  30 seconds chair stand test:  12    TODAY'S TREATMENT:                                                                                                                              DATE: 01/22/22: evaluation Decompression 1-5  Bridge x 10  Hamstring stretch x 3 Sitting posture x 10      PATIENT EDUCATION:  Education details: decompression and posture exercises  Person educated: Patient Education method: Explanation Education comprehension: verbalized understanding and returned demonstration  HOME EXERCISE PROGRAM: Decompression Abdominal set    ASSESSMENT:  CLINICAL IMPRESSION: Patient is a 81 y.o. F who  was seen today for physical therapy evaluation and treatment for low back pain.  Evaluation demonstrates decreased ROM, decreased muscle strength, increased pain and postural dysfunction.  Belinda Clark will benefit from skilled PT to address these issues and decrease her pain to improve her activity tolerance.   OBJECTIVE IMPAIRMENTS: decreased activity tolerance, decreased mobility, difficulty walking, decreased ROM, decreased strength, improper body mechanics, postural dysfunction, and pain.   ACTIVITY LIMITATIONS: carrying, lifting, bending, sitting, standing, squatting, stairs, and locomotion level  PARTICIPATION LIMITATIONS: cleaning, laundry, shopping, and community activity  PERSONAL FACTORS: Fitness, Past/current experiences, Time since onset of injury/illness/exacerbation, and 1-2 comorbidities: hx of fx and osteoporosis  are also affecting patient's functional outcome.   REHAB POTENTIAL: Good  CLINICAL DECISION MAKING: Evolving/moderate complexity  EVALUATION COMPLEXITY: Moderate   GOALS: Goals reviewed with patient? No  SHORT TERM GOALS: Target date: 01/27/22  Pt to be I in HEP to assist in decreasing pain to no greater than a 7/10 Baseline: Goal status: INITIAL  2.  Pt to be more aware of her posture to assist in decreasing pain.  Baseline:  Goal status: INITIAL    LONG TERM GOALS: Target date: N/A secondary to pt moving to [place]:  PT FREQUENCY: 3x/wk  PT DURATION: 1 weeks  PLANNED INTERVENTIONS: Therapeutic exercises, Therapeutic activity, Patient/Family education, Self Care, and Manual therapy.  PLAN FOR NEXT SESSION: Continue with lumbar stabilization and stretches keeping in mind that pt has osteoporosis.   Rayetta Humphrey, [place] CLT [phone]  01/22/2022 1345

## 2022-01-23 ENCOUNTER — Ambulatory Visit (HOSPITAL_COMMUNITY): Payer: Medicare Other | Admitting: Physical Therapy

## 2022-01-23 ENCOUNTER — Encounter (HOSPITAL_COMMUNITY): Payer: Self-pay | Admitting: Physical Therapy

## 2022-01-23 DIAGNOSIS — M5459 Other low back pain: Secondary | ICD-10-CM

## 2022-01-23 DIAGNOSIS — M6281 Muscle weakness (generalized): Secondary | ICD-10-CM | POA: Diagnosis not present

## 2022-01-23 NOTE — Therapy (Signed)
OUTPATIENT PHYSICAL THERAPY THORACOLUMBAR Treatment  Patient Name: FELIZ HERARD MRN: [id number] DOB:07-25-1940, 81 y.o., female Today's Date: 01/23/2022  END OF SESSION:  PT End of Session - 01/23/22 1338    Visit Number 2    Number of Visits 3    Date for PT Re-Evaluation 01/25/22    Authorization Type [employer] medicare    Progress Note Due on Visit 3    PT Start Time 950   PT Stop Time 1030   PT Time Calculation (min) 45 min    Activity Tolerance Patient tolerated treatment well    Behavior During Therapy WFL for tasks assessed/performed             Past Medical History:  Diagnosis Date   Anxiety    Arthritis    Asthma    Carotid artery stenosis    40-59 % -Left   GERD (gastroesophageal reflux disease)    Glaucoma    High blood pressure    HTN (hypertension)    Hyperlipidemia    Hypothyroidism    PONV (postoperative nausea and vomiting)    Past Surgical History:  Procedure Laterality Date   BREAST BIOPSY     x3   CATARACT EXTRACTION W/ INTRAOCULAR LENS  IMPLANT, BILATERAL     COLONOSCOPY N/A 10/05/2013   Procedure: COLONOSCOPY;  Surgeon: Jamesetta So, MD;  [facility];  Service: Gastroenterology;  Laterality: N/A;   ENDARTERECTOMY Left 05/19/2017   Procedure: ENDARTERECTOMY CAROTID LEFT;  Surgeon: Rosetta Posner, MD;  Location: [facility] OR;  Service: Vascular;  Laterality: Left;   FRACTURE SURGERY     Spinal Fx 06/30/11   NECK MASS EXCISION     PATCH ANGIOPLASTY Left 05/19/2017   Procedure: PATCH ANGIOPLASTY LEFT CAROTID ARTERY USING HEMASHIELD PLATINUM FINESSE PATCH;  Surgeon: Rosetta Posner, MD;  Location: [place];  Service: Vascular;  Laterality: Left;   RHINOPLASTY     3 total surgeries   SPINE SURGERY  06/30/2011   pt states surgery was 2002    THYROIDECTOMY     TONSILLECTOMY     Patient Active Problem List   Diagnosis Date Noted   Postsurgical hypothyroidism 04/06/2021   Iatrogenic hyperthyroidism 04/06/2021   Primary hypertension 03/15/2021    Status post left carotid endarterectomy 03/15/2021   Degenerative scoliosis in adult patient 03/31/2019   Body mass index (BMI) 19.9 or less, adult 03/31/2019   Low back pain 03/31/2019   Frontal fibrosing alopecia 41/66/0630   Lichen planus 16/02/930   Compression fracture of lumbar spine, non-traumatic ([place]) 06/23/2018   Hypothyroidism 02/23/2018   Age-related osteoporosis without current pathological fracture 07/07/2017   Lumbar herniated disc 03/12/2017   SOB (shortness of breath) 05/02/2014   Fullness of breast 02/14/2014   Hyperlipidemia 11/04/2012   Coronary artery disease involving native coronary artery 01/04/2011   PVD (peripheral vascular disease) ([place]) 01/04/2011   ANKLE SPRAIN 04/30/2007    PCP: Dr. Gerarda Fraction  REFERRING PROVIDER: Delrae Rend, MD  REFERRING DIAG:  Diagnosis  M81.0 (ICD-10-CM) - Age-related osteoporosis without current pathological fracture    Rationale for Evaluation and Treatment: Rehabilitation  THERAPY DIAG:  Low back pain, muscle weakness   ONSET DATE: chronic   SUBJECTIVE:  SUBJECTIVE STATEMENT:  Pt states that she only glanced at the exercises as she has been packing for her move.  Her pain is high today. PERTINENT HISTORY:  OA, osteoporosis, PVD, adult scoliosis, spinal fx in 2013   PAIN:  Are you having pain? Yes: NPRS scale: 8/10, worst pain is a 12; best 5 Pain location: Rt low back  Pain description: constant Aggravating factors: sitting and standing  Relieving factors: lying down   PRECAUTIONS: Other: osteoporosis.   WEIGHT BEARING RESTRICTIONS: No  FALLS:  Has patient fallen in last 6 months? No  LIVING ENVIRONMENT: Lives with: lives with their family Lives in: House/apartment Stairs: Yes: External: 2 steps; on right going up goes one at  a time  Has following equipment at home: None  OCCUPATION: retired   PLOF: Independent  PATIENT GOALS:  less pain and to tolerate activity better.   NEXT MD VISIT: Jan  OBJECTIVE:   DIAGNOSTIC FINDINGS:  osteoporosis    POSTURE: rounded shoulders, forward head, decreased lumbar lordosis, and increased thoracic kyphosis  PALPATION: Tight paraspinal mm   LUMBAR ROM:   AROM eval  Flexion Not tested due to osteoporosis   Extension   Right lateral flexion   Left lateral flexion   Right rotation   Left rotation    (Blank rows = not tested)   LOWER EXTREMITY MMT:    MMT Right eval Left eval  Hip flexion 5 4+  Hip extension 3- 3-  Hip abduction 3- 3-  Hip adduction    Hip internal rotation    Hip external rotation    Knee flexion    Knee extension    Ankle dorsiflexion    Ankle plantarflexion    Ankle inversion    Ankle eversion     (Blank rows = not tested)    FUNCTIONAL TESTS:  30 seconds chair stand test:  12    TODAY'S TREATMENT:                                                                                                                              DATE:  01/23/22: Educated pt on proper body mechanics for various ADL's Sitting tall and hip hinging Sitting push up W back X to V Money  Supine: Decompression exercises with theraband: Red Over head x 5 Side pull x 5 Sash x 5  Arm rotation x 5 Review of decompression exercises 1-5   01/22/22: evaluation Decompression 1-5  Bridge x 10  Hamstring stretch x 3 Sitting posture x 10      PATIENT EDUCATION:  Education details: decompression and posture exercises  Person educated: Patient Education method: Explanation Education comprehension: verbalized understanding and returned demonstration  HOME EXERCISE PROGRAM: 10/29:  Hand out on body mechanics. W-back,x to V, money, chair push up and decompression exercises with thera-band.   10/28 Decompression Abdominal set    ASSESSMENT:  CLINICAL IMPRESSION: Ms. Faw has not had a chance to complete her  HEP.  Therapist reviewed HEP and added new exercises as well as handouts on proper body mechanics.  Pt will only be able to come one more visit due to moving therefore goal is to give pt as many tools as possible to work on on her own..  Ms Hollon will benefit from skilled PT to address these issues and decrease her pain to improve her activity tolerance.   OBJECTIVE IMPAIRMENTS: decreased activity tolerance, decreased mobility, difficulty walking, decreased ROM, decreased strength, improper body mechanics, postural dysfunction, and pain.   ACTIVITY LIMITATIONS: carrying, lifting, bending, sitting, standing, squatting, stairs, and locomotion level  PARTICIPATION LIMITATIONS: cleaning, laundry, shopping, and community activity  PERSONAL FACTORS: Fitness, Past/current experiences, Time since onset of injury/illness/exacerbation, and 1-2 comorbidities: hx of fx and osteoporosis  are also affecting patient's functional outcome.   REHAB POTENTIAL: Good  CLINICAL DECISION MAKING: Evolving/moderate complexity  EVALUATION COMPLEXITY: Moderate   GOALS: Goals reviewed with patient? No  SHORT TERM GOALS: Target date: 01/27/22  Pt to be I in HEP to assist in decreasing pain to no greater than a 7/10 Baseline: Goal status: INITIAL  2.  Pt to be more aware of her posture to assist in decreasing pain.  Baseline:  Goal status: INITIAL    LONG TERM GOALS: Target date: N/A secondary to pt moving to [place]:  PT FREQUENCY: 3x/wk  PT DURATION: 1 weeks  PLANNED INTERVENTIONS: Therapeutic exercises, Therapeutic activity, Patient/Family education, Self Care, and Manual therapy.  PLAN FOR NEXT SESSION: Continue with lumbar stabilization and stretches keeping in mind that pt has osteoporosis.   Rayetta Humphrey, [place] CLT [phone]  01/23/2022 10:37

## 2022-01-25 ENCOUNTER — Ambulatory Visit (HOSPITAL_COMMUNITY): Payer: Medicare Other | Attending: Internal Medicine | Admitting: Physical Therapy

## 2022-01-25 DIAGNOSIS — M6281 Muscle weakness (generalized): Secondary | ICD-10-CM | POA: Insufficient documentation

## 2022-01-25 DIAGNOSIS — M5459 Other low back pain: Secondary | ICD-10-CM | POA: Insufficient documentation

## 2022-01-25 NOTE — Therapy (Signed)
OUTPATIENT PHYSICAL THERAPY THORACOLUMBAR Treatment  Patient Name: Belinda Clark MRN: [id number] DOB:03-25-1940, 81 y.o., female Today's Date: 01/25/2022  END OF SESSION:  PT End of Session - 01/25/22 1440     Visit Number 3    Number of Visits 3    Date for PT Re-Evaluation 01/25/22    Authorization Type [employer] medicare    Progress Note Due on Visit 3    PT Start Time 1440    PT Stop Time 1518    PT Time Calculation (min) 38 min    Activity Tolerance Patient tolerated treatment well    Behavior During Therapy WFL for tasks assessed/performed                 Past Medical History:  Diagnosis Date   Anxiety    Arthritis    Asthma    Carotid artery stenosis    40-59 % -Left   GERD (gastroesophageal reflux disease)    Glaucoma    High blood pressure    HTN (hypertension)    Hyperlipidemia    Hypothyroidism    PONV (postoperative nausea and vomiting)    Past Surgical History:  Procedure Laterality Date   BREAST BIOPSY     x3   CATARACT EXTRACTION W/ INTRAOCULAR LENS  IMPLANT, BILATERAL     COLONOSCOPY N/A 10/05/2013   Procedure: COLONOSCOPY;  Surgeon: Jamesetta So, MD;  [facility];  Service: Gastroenterology;  Laterality: N/A;   ENDARTERECTOMY Left 05/19/2017   Procedure: ENDARTERECTOMY CAROTID LEFT;  Surgeon: Rosetta Posner, MD;  Location: [facility] OR;  Service: Vascular;  Laterality: Left;   FRACTURE SURGERY     Spinal Fx 06/30/11   NECK MASS EXCISION     PATCH ANGIOPLASTY Left 05/19/2017   Procedure: PATCH ANGIOPLASTY LEFT CAROTID ARTERY USING HEMASHIELD PLATINUM FINESSE PATCH;  Surgeon: Rosetta Posner, MD;  Location: [place];  Service: Vascular;  Laterality: Left;   RHINOPLASTY     3 total surgeries   SPINE SURGERY  06/30/2011   pt states surgery was 2002    THYROIDECTOMY     TONSILLECTOMY     Patient Active Problem List   Diagnosis Date Noted   Postsurgical hypothyroidism 04/06/2021   Iatrogenic hyperthyroidism 04/06/2021   Primary hypertension  03/15/2021   Status post left carotid endarterectomy 03/15/2021   Degenerative scoliosis in adult patient 03/31/2019   Body mass index (BMI) 19.9 or less, adult 03/31/2019   Low back pain 03/31/2019   Frontal fibrosing alopecia 85/63/1497   Lichen planus 02/63/7858   Compression fracture of lumbar spine, non-traumatic ([place]) 06/23/2018   Hypothyroidism 02/23/2018   Age-related osteoporosis without current pathological fracture 07/07/2017   Lumbar herniated disc 03/12/2017   SOB (shortness of breath) 05/02/2014   Fullness of breast 02/14/2014   Hyperlipidemia 11/04/2012   Coronary artery disease involving native coronary artery 01/04/2011   PVD (peripheral vascular disease) ([place]) 01/04/2011   ANKLE SPRAIN 04/30/2007    PCP: Dr. Gerarda Fraction  REFERRING PROVIDER: Delrae Rend, MD  REFERRING DIAG:  Diagnosis  M81.0 (ICD-10-CM) - Age-related osteoporosis without current pathological fracture    Rationale for Evaluation and Treatment: Rehabilitation  THERAPY DIAG:  Low back pain, muscle weakness   ONSET DATE: chronic   SUBJECTIVE:  SUBJECTIVE STATEMENT:  Pt comes in teary eyed stating that her boyfriend is not sure that he wants her to move to [place] with her.  States that she is going to stay in [place] and request to continue therapy here. PERTINENT HISTORY:  OA, osteoporosis, PVD, adult scoliosis, spinal fx in 2013   PAIN:  Are you having pain? Yes: NPRS scale: 8/10, worst pain is a 12; best 5 Pain location: Rt low back  Pain description: constant Aggravating factors: sitting and standing  Relieving factors: lying down   PRECAUTIONS: Other: osteoporosis.   WEIGHT BEARING RESTRICTIONS: No  FALLS:  Has patient fallen in last 6 months? No  LIVING ENVIRONMENT: Lives with: lives with their  family Lives in: House/apartment Stairs: Yes: External: 2 steps; on right going up goes one at a time  Has following equipment at home: None  OCCUPATION: retired   PLOF: Independent  PATIENT GOALS:  less pain and to tolerate activity better.   NEXT MD VISIT: Jan  OBJECTIVE:   DIAGNOSTIC FINDINGS:  osteoporosis    POSTURE: rounded shoulders, forward head, decreased lumbar lordosis, and increased thoracic kyphosis  PALPATION: Tight paraspinal mm   LUMBAR ROM:   AROM eval  Flexion Not tested due to osteoporosis   Extension   Right lateral flexion   Left lateral flexion   Right rotation   Left rotation    (Blank rows = not tested)   LOWER EXTREMITY MMT:    MMT Right eval Left eval  Hip flexion 5 4+  Hip extension 3- 3-  Hip abduction 3- 3-  Hip adduction    Hip internal rotation    Hip external rotation    Knee flexion    Knee extension    Ankle dorsiflexion    Ankle plantarflexion    Ankle inversion    Ankle eversion     (Blank rows = not tested)    FUNCTIONAL TESTS:  30 seconds chair stand test:  12    TODAY'S TREATMENT:                                                                                                                              DATE:  Feb 07, 2022:   Prone: manual to deceased mm tension and improve pain to include efflurage and pettrisage with noted tightness in B paraspinal mm in thoracic and lumbar area. Prone glut sets x 10  POE x 2 minutes. Supine bridge x 10 Sit to stand x 10 Standing working on standing posture.  Education that if she was not going to lighten her purse she needed to get a cross purse.  01/23/22: Educated pt on proper body mechanics for various ADL's Sitting tall and hip hinging Sitting push up W back X to V Money  Supine: Decompression exercises with theraband: Red Over head x 5 Side pull x 5 Sash x 5  Arm rotation x 5 Review of decompression exercises 1-5   01/22/22: evaluation Decompression  1-5  Bridge x 10  Hamstring stretch x 3 Sitting posture x 10      PATIENT EDUCATION:  Education details: decompression and posture exercises  Person educated: Patient Education method: Explanation Education comprehension: verbalized understanding and returned demonstration  HOME EXERCISE PROGRAM: 10/29:  Hand out on body mechanics. W-back,x to V, money, chair push up and decompression exercises with thera-band.   10/28 Decompression Abdominal set   ASSESSMENT:  CLINICAL IMPRESSION: PT requests to be continued in [place] as she has had an argument with her boyfriend of 3 years and is no longer moving to [place] with him.  This has just happened and pt is very upset therefore exercises remained simple but effective.  Therapist will wait until next week to update cert to extend therapy in case the situation changes and pt does move to [place] as planned.  Pt will require continued therapy either here or in [place] non the less for improved strengthening, ROM and postural education to decrease her pain.   OBJECTIVE IMPAIRMENTS: decreased activity tolerance, decreased mobility, difficulty walking, decreased ROM, decreased strength, improper body mechanics, postural dysfunction, and pain.   ACTIVITY LIMITATIONS: carrying, lifting, bending, sitting, standing, squatting, stairs, and locomotion level  PARTICIPATION LIMITATIONS: cleaning, laundry, shopping, and community activity  PERSONAL FACTORS: Fitness, Past/current experiences, Time since onset of injury/illness/exacerbation, and 1-2 comorbidities: hx of fx and osteoporosis  are also affecting patient's functional outcome.   REHAB POTENTIAL: Good  CLINICAL DECISION MAKING: Evolving/moderate complexity  EVALUATION COMPLEXITY: Moderate   GOALS: Goals reviewed with patient? No  SHORT TERM GOALS: Target date: 01/27/22  Pt to be I in HEP to assist in decreasing pain to no greater than a 7/10 Baseline: Goal status: INITIAL  2.   Pt to be more aware of her posture to assist in decreasing pain.  Baseline:  Goal status: INITIAL    LONG TERM GOALS: Target date: N/A secondary to pt moving to [place]:  PT FREQUENCY: 3x/wk  PT DURATION: 1 weeks  PLANNED INTERVENTIONS: Therapeutic exercises, Therapeutic activity, Patient/Family education, Self Care, and Manual therapy.  PLAN FOR NEXT SESSION: Continue with lumbar stabilization and stretches keeping in mind that pt has osteoporosis.   Rayetta Humphrey, [place] CLT [phone]  01/25/2022 [phone]

## 2022-01-27 DIAGNOSIS — Z681 Body mass index (BMI) 19 or less, adult: Secondary | ICD-10-CM | POA: Diagnosis not present

## 2022-01-27 DIAGNOSIS — J029 Acute pharyngitis, unspecified: Secondary | ICD-10-CM | POA: Diagnosis not present

## 2022-01-28 ENCOUNTER — Encounter (HOSPITAL_COMMUNITY): Payer: Medicare Other | Admitting: Physical Therapy

## 2022-02-05 ENCOUNTER — Ambulatory Visit (HOSPITAL_COMMUNITY): Payer: Medicare Other | Admitting: Physical Therapy

## 2022-02-05 DIAGNOSIS — M6281 Muscle weakness (generalized): Secondary | ICD-10-CM | POA: Diagnosis not present

## 2022-02-05 DIAGNOSIS — M5459 Other low back pain: Secondary | ICD-10-CM

## 2022-02-05 NOTE — Therapy (Signed)
OUTPATIENT PHYSICAL THERAPY THORACOLUMBAR Treatment  Patient Name: Belinda Clark MRN: [id number] DOB:Jan 16, 1941, 81 y.o., female Today's Date: 02/05/2022  END OF SESSION:  PT End of Session - 02/05/22 1516     Visit Number 4    Number of Visits 12    Date for PT Re-Evaluation 03/11/22    Authorization Type [employer] medicare    Progress Note Due on Visit 10    PT Start Time 1520    PT Stop Time 1600    PT Time Calculation (min) 40 min    Activity Tolerance Patient tolerated treatment well    Behavior During Therapy WFL for tasks assessed/performed                 Past Medical History:  Diagnosis Date   Anxiety    Arthritis    Asthma    Carotid artery stenosis    40-59 % -Left   GERD (gastroesophageal reflux disease)    Glaucoma    High blood pressure    HTN (hypertension)    Hyperlipidemia    Hypothyroidism    PONV (postoperative nausea and vomiting)    Past Surgical History:  Procedure Laterality Date   BREAST BIOPSY     x3   CATARACT EXTRACTION W/ INTRAOCULAR LENS  IMPLANT, BILATERAL     COLONOSCOPY N/A 10/05/2013   Procedure: COLONOSCOPY;  Surgeon: Jamesetta So, MD;  [facility];  Service: Gastroenterology;  Laterality: N/A;   ENDARTERECTOMY Left 05/19/2017   Procedure: ENDARTERECTOMY CAROTID LEFT;  Surgeon: Rosetta Posner, MD;  Location: [facility] OR;  Service: Vascular;  Laterality: Left;   FRACTURE SURGERY     Spinal Fx 06/30/11   NECK MASS EXCISION     PATCH ANGIOPLASTY Left 05/19/2017   Procedure: PATCH ANGIOPLASTY LEFT CAROTID ARTERY USING HEMASHIELD PLATINUM FINESSE PATCH;  Surgeon: Rosetta Posner, MD;  Location: [facility];  Service: Vascular;  Laterality: Left;   RHINOPLASTY     3 total surgeries   SPINE SURGERY  06/30/2011   pt states surgery was 2002    THYROIDECTOMY     TONSILLECTOMY     Patient Active Problem List   Diagnosis Date Noted   Postsurgical hypothyroidism 04/06/2021   Iatrogenic hyperthyroidism 04/06/2021   Primary hypertension  03/15/2021   Status post left carotid endarterectomy 03/15/2021   Degenerative scoliosis in adult patient 03/31/2019   Body mass index (BMI) 19.9 or less, adult 03/31/2019   Low back pain 03/31/2019   Frontal fibrosing alopecia 40/98/1191   Lichen planus 47/82/9562   Compression fracture of lumbar spine, non-traumatic ([place]) 06/23/2018   Hypothyroidism 02/23/2018   Age-related osteoporosis without current pathological fracture 07/07/2017   Lumbar herniated disc 03/12/2017   SOB (shortness of breath) 05/02/2014   Fullness of breast 02/14/2014   Hyperlipidemia 11/04/2012   Coronary artery disease involving native coronary artery 01/04/2011   PVD (peripheral vascular disease) ([place]) 01/04/2011   ANKLE SPRAIN 04/30/2007    PCP: Dr. Gerarda Fraction  REFERRING PROVIDER: Delrae Rend, MD  REFERRING DIAG:  Diagnosis  M81.0 (ICD-10-CM) - Age-related osteoporosis without current pathological fracture    Rationale for Evaluation and Treatment: Rehabilitation  THERAPY DIAG:  Low back pain, muscle weakness   ONSET DATE: chronic   SUBJECTIVE:  SUBJECTIVE STATEMENT:  Pt will not be moving and requests to complete her treatments here.    PERTINENT HISTORY:  OA, osteoporosis, PVD, adult scoliosis, spinal fx in 2013   PAIN:  Are you having pain? Yes: NPRS scale: 2/10, worst pain is a 12; best 5 Pain location: Rt low back  Pain description: constant Aggravating factors: sitting and standing  Relieving factors: lying down   PRECAUTIONS: Other: osteoporosis.   WEIGHT BEARING RESTRICTIONS: No  FALLS:  Has patient fallen in last 6 months? No  LIVING ENVIRONMENT: Lives with: lives with their family Lives in: House/apartment Stairs: Yes: External: 2 steps; on right going up goes one at a time  Has following  equipment at home: None  OCCUPATION: retired   PLOF: Independent  PATIENT GOALS:  less pain and to tolerate activity better.   NEXT MD VISIT: Jan  OBJECTIVE:   DIAGNOSTIC FINDINGS:  osteoporosis    POSTURE: rounded shoulders, forward head, decreased lumbar lordosis, and increased thoracic kyphosis  PALPATION: Tight paraspinal mm   LUMBAR ROM:   AROM eval  Flexion Not tested due to osteoporosis   Extension   Right lateral flexion   Left lateral flexion   Right rotation   Left rotation    (Blank rows = not tested)   LOWER EXTREMITY MMT:    MMT Right eval Left eval  Hip flexion 5 4+  Hip extension 3- 3-  Hip abduction 3- 3-  Hip adduction    Hip internal rotation    Hip external rotation    Knee flexion    Knee extension    Ankle dorsiflexion    Ankle plantarflexion    Ankle inversion    Ankle eversion     (Blank rows = not tested)    FUNCTIONAL TESTS:  30 seconds chair stand test:  12    TODAY'S TREATMENT:                                                                                                                              DATE:  02/05/22: Sitting: Cervical retraction x 10 Scapular retraction x 10 Money x 10  X to V x10 PNF II red t-band x 10  Supine: Bridge Scapular retraction x 10 Squeeze a little more x10 POE manual to deceased mm tension and improve pain to include efflurage and pettrisage with noted tightness in B paraspinal mm in thoracic and lumbar area.  02/20/22:   Prone: manual to deceased mm tension and improve pain to include efflurage and pettrisage with noted tightness in B paraspinal mm in thoracic and lumbar area. Prone glut sets x 10  POE x 2 minutes. Supine bridge x 10 Sit to stand x 10 Standing working on standing posture.  Education that if she was not going to lighten her purse she needed to get a cross purse.  01/23/22: Educated pt on proper body mechanics for various ADL's Sitting tall and hip  hinging Sitting  push up W back X to V Money  Supine: Decompression exercises with theraband: Red Over head x 5 Side pull x 5 Sash x 5  Arm rotation x 5 Review of decompression exercises 1-5   01/22/22: evaluation Decompression 1-5  Bridge x 10  Hamstring stretch x 3 Sitting posture x 10      PATIENT EDUCATION:  Education details: decompression and posture exercises  Person educated: Patient Education method: Explanation Education comprehension: verbalized understanding and returned demonstration  HOME EXERCISE PROGRAM: 10/29:  Hand out on body mechanics. W-back,x to V, money, chair push up and decompression exercises with thera-band.   10/28 Decompression Abdominal set   ASSESSMENT:  CLINICAL IMPRESSION: .  Pt will require continued therapy either here or in [place] non the less for improved strengthening, ROM and postural education to decrease her pain.   OBJECTIVE IMPAIRMENTS: decreased activity tolerance, decreased mobility, difficulty walking, decreased ROM, decreased strength, improper body mechanics, postural dysfunction, and pain.   ACTIVITY LIMITATIONS: carrying, lifting, bending, sitting, standing, squatting, stairs, and locomotion level  PARTICIPATION LIMITATIONS: cleaning, laundry, shopping, and community activity  PERSONAL FACTORS: Fitness, Past/current experiences, Time since onset of injury/illness/exacerbation, and 1-2 comorbidities: hx of fx and osteoporosis  are also affecting patient's functional outcome.   REHAB POTENTIAL: Good  CLINICAL DECISION MAKING: Evolving/moderate complexity  EVALUATION COMPLEXITY: Moderate   GOALS: Goals reviewed with patient? No  SHORT TERM GOALS: Target date: 01/27/22  Pt to be I in HEP to assist in decreasing pain to no greater than a 7/10 Baseline: Goal status: MET  2.  Pt to be more aware of her posture to assist in decreasing pain.  Baseline:  Goal status: IN PROGRESS    LONG TERM GOALS:                      1. Pt to be I in advanced HEP in order to decrease her back pain to no greater than a 4/10                         Goal status: In progress                     2.  PT LE strength to be increased one grade to be able to go up and down steps in a reciprocal manner             Goal status:   In progress              3.  PT to be able to be on her feet, (standing/walking) x 30 minutes without increased pain for shopping            Goal status:  In progress  PLAN:  PT FREQUENCY: 3x/wk; 2 x week for 4 more weeks   PT DURATION: 1 weeks 4 weeks   PLANNED INTERVENTIONS: Therapeutic exercises, Therapeutic activity, Patient/Family education, Self Care, and Manual therapy.  PLAN FOR NEXT SESSION: Continue with lumbar stabilization and stretches keeping in mind that pt has osteoporosis.   Rayetta Humphrey, [place] CLT [phone]  02/05/2022 [phone]

## 2022-02-06 ENCOUNTER — Ambulatory Visit (HOSPITAL_COMMUNITY): Payer: Medicare Other | Admitting: Physical Therapy

## 2022-02-06 DIAGNOSIS — M6281 Muscle weakness (generalized): Secondary | ICD-10-CM

## 2022-02-06 DIAGNOSIS — M5459 Other low back pain: Secondary | ICD-10-CM

## 2022-02-06 DIAGNOSIS — E039 Hypothyroidism, unspecified: Secondary | ICD-10-CM | POA: Diagnosis not present

## 2022-02-06 NOTE — Therapy (Signed)
OUTPATIENT PHYSICAL THERAPY THORACOLUMBAR Treatment  Patient Name: BIJAL SIGLIN MRN: [id number] DOB:[birth year], 81 y.o., female Today's Date: 02/06/2022  END OF SESSION:  PT End of Session - 02/06/22 0903    Visit Number 5    Number of Visits 12    Date for PT Re-Evaluation 03/11/22    Authorization Type [employer] medicare    Progress Note Due on Visit 10    PT Start Time 0823    PT Stop Time 0903    PT Time Calculation (min) 40 min    Activity Tolerance Patient tolerated treatment well    Behavior During Therapy WFL for tasks assessed/performed                 Past Medical History:  Diagnosis Date   Anxiety    Arthritis    Asthma    Carotid artery stenosis    40-59 % -Left   GERD (gastroesophageal reflux disease)    Glaucoma    High blood pressure    HTN (hypertension)    Hyperlipidemia    Hypothyroidism    PONV (postoperative nausea and vomiting)    Past Surgical History:  Procedure Laterality Date   BREAST BIOPSY     x3   CATARACT EXTRACTION W/ INTRAOCULAR LENS  IMPLANT, BILATERAL     COLONOSCOPY N/A 10/05/2013   Procedure: COLONOSCOPY;  Surgeon: Jamesetta So, MD;  [facility];  Service: Gastroenterology;  Laterality: N/A;   ENDARTERECTOMY Left 05/19/2017   Procedure: ENDARTERECTOMY CAROTID LEFT;  Surgeon: Rosetta Posner, MD;  Location: [facility] OR;  Service: Vascular;  Laterality: Left;   FRACTURE SURGERY     Spinal Fx 06/30/11   NECK MASS EXCISION     PATCH ANGIOPLASTY Left 05/19/2017   Procedure: PATCH ANGIOPLASTY LEFT CAROTID ARTERY USING HEMASHIELD PLATINUM FINESSE PATCH;  Surgeon: Rosetta Posner, MD;  Location: [place];  Service: Vascular;  Laterality: Left;   RHINOPLASTY     3 total surgeries   SPINE SURGERY  06/30/2011   pt states surgery was 2002    THYROIDECTOMY     TONSILLECTOMY     Patient Active Problem List   Diagnosis Date Noted   Postsurgical hypothyroidism 04/06/2021   Iatrogenic hyperthyroidism 04/06/2021   Primary hypertension  03/15/2021   Status post left carotid endarterectomy 03/15/2021   Degenerative scoliosis in adult patient 03/31/2019   Body mass index (BMI) 19.9 or less, adult 03/31/2019   Low back pain 03/31/2019   Frontal fibrosing alopecia 96/29/5284   Lichen planus 13/24/4010   Compression fracture of lumbar spine, non-traumatic ([place]) 06/23/2018   Hypothyroidism 02/23/2018   Age-related osteoporosis without current pathological fracture 07/07/2017   Lumbar herniated disc 03/12/2017   SOB (shortness of breath) 05/02/2014   Fullness of breast 02/14/2014   Hyperlipidemia 11/04/2012   Coronary artery disease involving native coronary artery 01/04/2011   PVD (peripheral vascular disease) ([place]) 01/04/2011   ANKLE SPRAIN 04/30/2007    PCP: Dr. Gerarda Fraction  REFERRING PROVIDER: Delrae Rend, MD  REFERRING DIAG:  Diagnosis  M81.0 (ICD-10-CM) - Age-related osteoporosis without current pathological fracture    Rationale for Evaluation and Treatment: Rehabilitation  THERAPY DIAG:  Low back pain, muscle weakness   ONSET DATE: chronic   SUBJECTIVE:  SUBJECTIVE STATEMENT:  Pt has only had two hrs of sleep last night.  She got up and started cleaning.  She has not had a chance to work on the new exercises.  PERTINENT HISTORY:  OA, osteoporosis, PVD, adult scoliosis, spinal fx in 2013   PAIN:  Are you having pain? Yes: NPRS scale: 7 /10, worst pain is a 12; best 5 Pain location: Rt low back  Pain description: constant Aggravating factors: sitting and standing  Relieving factors: lying down   PRECAUTIONS: Other: osteoporosis.   WEIGHT BEARING RESTRICTIONS: No  FALLS:  Has patient fallen in last 6 months? No  LIVING ENVIRONMENT: Lives with: lives with their family Lives in: House/apartment Stairs: Yes: External:  2 steps; on right going up goes one at a time  Has following equipment at home: None  OCCUPATION: retired   PLOF: Independent  PATIENT GOALS:  less pain and to tolerate activity better.   NEXT MD VISIT: Jan  OBJECTIVE:   DIAGNOSTIC FINDINGS:  osteoporosis    POSTURE: rounded shoulders, forward head, decreased lumbar lordosis, and increased thoracic kyphosis  PALPATION: Tight paraspinal mm   LUMBAR ROM:   AROM eval  Flexion Not tested due to osteoporosis   Extension   Right lateral flexion   Left lateral flexion   Right rotation   Left rotation    (Blank rows = not tested)   LOWER EXTREMITY MMT:    MMT Right eval Left eval  Hip flexion 5 4+  Hip extension 3- 3-  Hip abduction 3- 3-  Hip adduction    Hip internal rotation    Hip external rotation    Knee flexion    Knee extension    Ankle dorsiflexion    Ankle plantarflexion    Ankle inversion    Ankle eversion     (Blank rows = not tested)    FUNCTIONAL TESTS:  30 seconds chair stand test:  12    TODAY'S TREATMENT:                                                                                                                              DATE:  02/06/22 Standing:  Wall arch x 10 Postural exercises with green theraband: scapular retraction, row and extension Side stepping x 2 RT  PNF II red t-band x 10 Sitting : W back with 2# Money x 10  X to V x10 POE manual to deceased mm tension and improve pain to include efflurage and pettrisage with noted tightness in B paraspinal mm in thoracic and lumbar area. 02/05/22: Sitting: Cervical retraction x 10 Scapular retraction x 10 Money x 10  X to V x10 PNF II red t-band x 10  Supine: Bridge Scapular retraction x 10 Squeeze a little more x10 POE manual to deceased mm tension and improve pain to include efflurage and pettrisage with noted tightness in B paraspinal mm in thoracic and lumbar area.  01/25/2022:   Prone:  manual to deceased mm  tension and improve pain to include efflurage and pettrisage with noted tightness in B paraspinal mm in thoracic and lumbar area. Prone glut sets x 10  POE x 2 minutes. Supine bridge x 10 Sit to stand x 10 Standing working on standing posture.  Education that if she was not going to lighten her purse she needed to get a cross purse.  01/23/22: Educated pt on proper body mechanics for various ADL's Sitting tall and hip hinging Sitting push up W back X to V Money  Supine: Decompression exercises with theraband: Red Over head x 5 Side pull x 5 Sash x 5  Arm rotation x 5 Review of decompression exercises 1-5   01/22/22: evaluation Decompression 1-5  Bridge x 10  Hamstring stretch x 3 Sitting posture x 10      PATIENT EDUCATION:  Education details: decompression and posture exercises  Person educated: Patient Education method: Explanation Education comprehension: verbalized understanding and returned demonstration  HOME EXERCISE PROGRAM: 10/29:  Hand out on body mechanics. W-back,x to V, money, chair push up and decompression exercises with thera-band.   10/28 Decompression Abdominal set   ASSESSMENT:  CLINICAL IMPRESSION: Pt instructed in standing postural exercises and given HEP.   Pt questions if she needs to exercise for the rest of her life.  Therapist explained that WB exercises is one of the keys to keeping her bones and muscles stronger, which in turn will keep her more functional.     OBJECTIVE IMPAIRMENTS: decreased activity tolerance, decreased mobility, difficulty walking, decreased ROM, decreased strength, improper body mechanics, postural dysfunction, and pain.   ACTIVITY LIMITATIONS: carrying, lifting, bending, sitting, standing, squatting, stairs, and locomotion level  PARTICIPATION LIMITATIONS: cleaning, laundry, shopping, and community activity  PERSONAL FACTORS: Fitness, Past/current experiences, Time since onset of injury/illness/exacerbation, and  1-2 comorbidities: hx of fx and osteoporosis  are also affecting patient's functional outcome.   REHAB POTENTIAL: Good  CLINICAL DECISION MAKING: Evolving/moderate complexity  EVALUATION COMPLEXITY: Moderate   GOALS: Goals reviewed with patient? No  SHORT TERM GOALS: Target date: 01/27/22  Pt to be I in HEP to assist in decreasing pain to no greater than a 7/10 Baseline: Goal status: MET  2.  Pt to be more aware of her posture to assist in decreasing pain.  Baseline:  Goal status: IN PROGRESS    LONG TERM GOALS:                     1. Pt to be I in advanced HEP in order to decrease her back pain to no greater than a 4/10                         Goal status: In progress                     2.  PT LE strength to be increased one grade to be able to go up and down steps in a reciprocal manner             Goal status:   In progress              3.  PT to be able to be on her feet, (standing/walking) x 30 minutes without increased pain for shopping            Goal status:  In progress  PLAN:  PT FREQUENCY: 3x/wk; 2 x week for 4 more  weeks   PT DURATION: 1 weeks 4 weeks   PLANNED INTERVENTIONS: Therapeutic exercises, Therapeutic activity, Patient/Family education, Self Care, and Manual therapy.  PLAN FOR NEXT SESSION: Continue with lumbar stabilization and stretches keeping in mind that pt has osteoporosis.   Rayetta Humphrey, [place] CLT [phone]  02/06/2022 [phone]

## 2022-02-12 ENCOUNTER — Encounter (HOSPITAL_COMMUNITY): Payer: Medicare Other | Admitting: Physical Therapy

## 2022-02-13 DIAGNOSIS — E890 Postprocedural hypothyroidism: Secondary | ICD-10-CM | POA: Diagnosis not present

## 2022-02-27 ENCOUNTER — Encounter (HOSPITAL_COMMUNITY): Payer: Self-pay

## 2022-02-27 ENCOUNTER — Ambulatory Visit (HOSPITAL_COMMUNITY): Payer: Medicare Other | Attending: Internal Medicine

## 2022-02-27 DIAGNOSIS — M5459 Other low back pain: Secondary | ICD-10-CM | POA: Diagnosis not present

## 2022-02-27 DIAGNOSIS — M6281 Muscle weakness (generalized): Secondary | ICD-10-CM

## 2022-02-27 NOTE — Therapy (Signed)
OUTPATIENT PHYSICAL THERAPY THORACOLUMBAR Treatment  Patient Name: Belinda Clark MRN: [id number] DOB:November 29, 1940, 82 y.o., female Today's Date: 02/27/2022  END OF SESSION:  PT End of Session - 02/27/22 1650     Visit Number 6    Number of Visits 12    Date for PT Re-Evaluation 03/11/22    Authorization Type [employer] medicare    Progress Note Due on Visit 10    PT Start Time 1604    PT Stop Time 4742    PT Time Calculation (min) 41 min    Activity Tolerance Patient tolerated treatment well    Behavior During Therapy WFL for tasks assessed/performed               Past Medical History:  Diagnosis Date   Anxiety    Arthritis    Asthma    Carotid artery stenosis    40-59 % -Left   GERD (gastroesophageal reflux disease)    Glaucoma    High blood pressure    HTN (hypertension)    Hyperlipidemia    Hypothyroidism    PONV (postoperative nausea and vomiting)    Past Surgical History:  Procedure Laterality Date   BREAST BIOPSY     x3   CATARACT EXTRACTION W/ INTRAOCULAR LENS  IMPLANT, BILATERAL     COLONOSCOPY N/A 10/05/2013   Procedure: COLONOSCOPY;  Surgeon: Jamesetta So, MD;  [facility];  Service: Gastroenterology;  Laterality: N/A;   ENDARTERECTOMY Left 05/19/2017   Procedure: ENDARTERECTOMY CAROTID LEFT;  Surgeon: Rosetta Posner, MD;  [facility]: [facility];  Service: Vascular;  Laterality: Left;   FRACTURE SURGERY     Spinal Fx 06/30/11   NECK MASS EXCISION     PATCH ANGIOPLASTY Left 05/19/2017   Procedure: PATCH ANGIOPLASTY LEFT CAROTID ARTERY USING HEMASHIELD PLATINUM FINESSE PATCH;  Surgeon: Rosetta Posner, MD;  Location: [place];  Service: Vascular;  Laterality: Left;   RHINOPLASTY     3 total surgeries   SPINE SURGERY  06/30/2011   pt states surgery was 2002    THYROIDECTOMY     TONSILLECTOMY     Patient Active Problem List   Diagnosis Date Noted   Postsurgical hypothyroidism 04/06/2021   Iatrogenic hyperthyroidism 04/06/2021   Primary hypertension  03/15/2021   Status post left carotid endarterectomy 03/15/2021   Degenerative scoliosis in adult patient 03/31/2019   Body mass index (BMI) 19.9 or less, adult 03/31/2019   Low back pain 03/31/2019   Frontal fibrosing alopecia 59/56/3875   Lichen planus 64/33/2951   Compression fracture of lumbar spine, non-traumatic ([place]) 06/23/2018   Hypothyroidism 02/23/2018   Age-related osteoporosis without current pathological fracture 07/07/2017   Lumbar herniated disc 03/12/2017   SOB (shortness of breath) 05/02/2014   Fullness of breast 02/14/2014   Hyperlipidemia 11/04/2012   Coronary artery disease involving native coronary artery 01/04/2011   PVD (peripheral vascular disease) ([place]) 01/04/2011   ANKLE SPRAIN 04/30/2007    PCP: Dr. Gerarda Fraction  REFERRING PROVIDER: Delrae Rend, MD  REFERRING DIAG:  Diagnosis  M81.0 (ICD-10-CM) - Age-related osteoporosis without current pathological fracture    Rationale for Evaluation and Treatment: Rehabilitation  THERAPY DIAG:  Low back pain, muscle weakness   ONSET DATE: chronic   SUBJECTIVE:  SUBJECTIVE STATEMENT:  Pt has only had two hrs of sleep last night.  She got up and started cleaning.  She has not had a chance to work on the new exercises.   Pt reports she constant lower back pain.  Has been to beach over holidays and reports she has not completed the exercises as much as she knows she should.    PERTINENT HISTORY:  OA, osteoporosis, PVD, adult scoliosis, spinal fx in 2013   PAIN:  Are you having pain? Yes: NPRS scale: 6/10, worst pain is a 12; best 5 Pain location: Rt low back  Pain description: constant Aggravating factors: sitting and standing  Relieving factors: lying down, Tylenol  PRECAUTIONS: Other: osteoporosis.   WEIGHT BEARING  RESTRICTIONS: No  FALLS:  Has patient fallen in last 6 months? No  LIVING ENVIRONMENT: Lives with: lives with their family Lives in: House/apartment Stairs: Yes: External: 2 steps; on right going up goes one at a time  Has following equipment at home: None  OCCUPATION: retired   PLOF: Independent  PATIENT GOALS:  less pain and to tolerate activity better.   NEXT MD VISIT: Jan  OBJECTIVE:   DIAGNOSTIC FINDINGS:  osteoporosis    POSTURE: rounded shoulders, forward head, decreased lumbar lordosis, and increased thoracic kyphosis  PALPATION: Tight paraspinal mm   LUMBAR ROM:   AROM eval  Flexion Not tested due to osteoporosis   Extension   Right lateral flexion   Left lateral flexion   Right rotation   Left rotation    (Blank rows = not tested)   LOWER EXTREMITY MMT:    MMT Right eval Left eval  Hip flexion 5 4+  Hip extension 3- 3-  Hip abduction 3- 3-  Hip adduction    Hip internal rotation    Hip external rotation    Knee flexion    Knee extension    Ankle dorsiflexion    Ankle plantarflexion    Ankle inversion    Ankle eversion     (Blank rows = not tested)    FUNCTIONAL TESTS:  30 seconds chair stand test:  12    TODAY'S TREATMENT:                                                                                                                              DATE:  02/27/22 Standing:  Wall arch with heel raise x 10 Marching 10x 3" light 1 UE support Side stepping x 2 RT RTB around thigh STS eccentric control RTB around thigh to reduce valgus 10x [place] 10x RTB both directions Seated: X to V 10  POE manual to deceased mm tension and improve pain to include efflurage and pettrisage with noted tightness in B paraspinal mm in thoracic and lumbar area.  02/06/22 Standing:  Wall arch x 10 Postural exercises with green theraband: scapular retraction, row and extension Side stepping x 2 RT  PNF II red t-band x 10 Sitting :  W back with  2# Money x 10  X to V x10 POE manual to deceased mm tension and improve pain to include efflurage and pettrisage with noted tightness in B paraspinal mm in thoracic and lumbar area. 02/05/22: Sitting: Cervical retraction x 10 Scapular retraction x 10 Money x 10  X to V x10 PNF II red t-band x 10  Supine: Bridge Scapular retraction x 10 Squeeze a little more x10 POE manual to deceased mm tension and improve pain to include efflurage and pettrisage with noted tightness in B paraspinal mm in thoracic and lumbar area.  02/24/2022:   Prone: manual to deceased mm tension and improve pain to include efflurage and pettrisage with noted tightness in B paraspinal mm in thoracic and lumbar area. Prone glut sets x 10  POE x 2 minutes. Supine bridge x 10 Sit to stand x 10 Standing working on standing posture.  Education that if she was not going to lighten her purse she needed to get a cross purse.  01/23/22: Educated pt on proper body mechanics for various ADL's Sitting tall and hip hinging Sitting push up W back X to V Money  Supine: Decompression exercises with theraband: Red Over head x 5 Side pull x 5 Sash x 5  Arm rotation x 5 Review of decompression exercises 1-5   01/22/22: evaluation Decompression 1-5  Bridge x 10  Hamstring stretch x 3 Sitting posture x 10      PATIENT EDUCATION:  Education details: decompression and posture exercises  Person educated: Patient Education method: Explanation Education comprehension: verbalized understanding and returned demonstration  HOME EXERCISE PROGRAM: 10/29:  Hand out on body mechanics. W-back,x to V, money, chair push up and decompression exercises with thera-band.   10/28 Decompression Abdominal set   ASSESSMENT:  CLINICAL IMPRESSION: Added resistance for balance, LE and core strengthening with positive feedback.  Discussed a walking program and importance of continued WB exercise is key for bone and muscle  strength.  Reports pain reduced at EOS.     OBJECTIVE IMPAIRMENTS: decreased activity tolerance, decreased mobility, difficulty walking, decreased ROM, decreased strength, improper body mechanics, postural dysfunction, and pain.   ACTIVITY LIMITATIONS: carrying, lifting, bending, sitting, standing, squatting, stairs, and locomotion level  PARTICIPATION LIMITATIONS: cleaning, laundry, shopping, and community activity  PERSONAL FACTORS: Fitness, Past/current experiences, Time since onset of injury/illness/exacerbation, and 1-2 comorbidities: hx of fx and osteoporosis  are also affecting patient's functional outcome.   REHAB POTENTIAL: Good  CLINICAL DECISION MAKING: Evolving/moderate complexity  EVALUATION COMPLEXITY: Moderate   GOALS: Goals reviewed with patient? No  SHORT TERM GOALS: Target date: 01/27/22  Pt to be I in HEP to assist in decreasing pain to no greater than a 7/10 Baseline: Goal status: MET  2.  Pt to be more aware of her posture to assist in decreasing pain.  Baseline:  Goal status: IN PROGRESS    LONG TERM GOALS:                     1. Pt to be I in advanced HEP in order to decrease her back pain to no greater than a 4/10                         Goal status: In progress                     2.  PT LE strength to be increased one grade to be  able to go up and down steps in a reciprocal manner             Goal status:   In progress              3.  PT to be able to be on her feet, (standing/walking) x 30 minutes without increased pain for shopping            Goal status:  In progress  PLAN:  PT FREQUENCY: 3x/wk; 2 x week for 4 more weeks   PT DURATION: 1 weeks 4 weeks   PLANNED INTERVENTIONS: Therapeutic exercises, Therapeutic activity, Patient/Family education, Self Care, and Manual therapy.  PLAN FOR NEXT SESSION: Continue with lumbar stabilization and stretches keeping in mind that pt has osteoporosis.   Ihor Austin, LPTA/CLT;  Delana Meyer [phone]  02/27/2022

## 2022-02-28 ENCOUNTER — Encounter (HOSPITAL_COMMUNITY): Payer: Medicare Other | Admitting: Physical Therapy

## 2022-03-05 ENCOUNTER — Encounter (HOSPITAL_COMMUNITY): Payer: Medicare Other | Admitting: Physical Therapy

## 2022-03-06 ENCOUNTER — Encounter (HOSPITAL_COMMUNITY): Payer: Medicare Other

## 2022-03-07 ENCOUNTER — Ambulatory Visit (HOSPITAL_COMMUNITY): Payer: Medicare Other | Admitting: Physical Therapy

## 2022-03-07 DIAGNOSIS — M5459 Other low back pain: Secondary | ICD-10-CM

## 2022-03-07 DIAGNOSIS — M6281 Muscle weakness (generalized): Secondary | ICD-10-CM

## 2022-03-07 NOTE — Therapy (Signed)
OUTPATIENT PHYSICAL THERAPY THORACOLUMBAR Treatment  Patient Name: Belinda Clark MRN: [id number] DOB:1941/02/12, 82 y.o., female Today's Date: 03/07/2022  END OF SESSION:  PT End of Session - 03/07/22 1350     Visit Number 7    Number of Visits 12    Date for PT Re-Evaluation 03/11/22    Authorization Type [employer] medicare    Progress Note Due on Visit 10    PT Start Time 1305    PT Stop Time 1350    PT Time Calculation (min) 45 min    Activity Tolerance Patient tolerated treatment well    Behavior During Therapy WFL for tasks assessed/performed               Past Medical History:  Diagnosis Date   Anxiety    Arthritis    Asthma    Carotid artery stenosis    40-59 % -Left   GERD (gastroesophageal reflux disease)    Glaucoma    High blood pressure    HTN (hypertension)    Hyperlipidemia    Hypothyroidism    PONV (postoperative nausea and vomiting)    Past Surgical History:  Procedure Laterality Date   BREAST BIOPSY     x3   CATARACT EXTRACTION W/ INTRAOCULAR LENS  IMPLANT, BILATERAL     COLONOSCOPY N/A 10/05/2013   Procedure: COLONOSCOPY;  Surgeon: Jamesetta So, MD;  [facility];  Service: Gastroenterology;  Laterality: N/A;   ENDARTERECTOMY Left 05/19/2017   Procedure: ENDARTERECTOMY CAROTID LEFT;  Surgeon: Rosetta Posner, MD;  Location: [facility] OR;  Service: Vascular;  Laterality: Left;   FRACTURE SURGERY     Spinal Fx 06/30/11   NECK MASS EXCISION     PATCH ANGIOPLASTY Left 05/19/2017   Procedure: PATCH ANGIOPLASTY LEFT CAROTID ARTERY USING HEMASHIELD PLATINUM FINESSE PATCH;  Surgeon: Rosetta Posner, MD;  Location: [place];  Service: Vascular;  Laterality: Left;   RHINOPLASTY     3 total surgeries   SPINE SURGERY  06/30/2011   pt states surgery was 2002    THYROIDECTOMY     TONSILLECTOMY     Patient Active Problem List   Diagnosis Date Noted   Postsurgical hypothyroidism 04/06/2021   Iatrogenic hyperthyroidism 04/06/2021   Primary hypertension  03/15/2021   Status post left carotid endarterectomy 03/15/2021   Degenerative scoliosis in adult patient 03/31/2019   Body mass index (BMI) 19.9 or less, adult 03/31/2019   Low back pain 03/31/2019   Frontal fibrosing alopecia 86/75/4492   Lichen planus 01/00/7121   Compression fracture of lumbar spine, non-traumatic ([place]) 06/23/2018   Hypothyroidism 02/23/2018   Age-related osteoporosis without current pathological fracture 07/07/2017   Lumbar herniated disc 03/12/2017   SOB (shortness of breath) 05/02/2014   Fullness of breast 02/14/2014   Hyperlipidemia 11/04/2012   Coronary artery disease involving native coronary artery 01/04/2011   PVD (peripheral vascular disease) ([place]) 01/04/2011   ANKLE SPRAIN 04/30/2007    PCP: Dr. Gerarda Fraction  REFERRING PROVIDER: Delrae Rend, MD  REFERRING DIAG:  Diagnosis  M81.0 (ICD-10-CM) - Age-related osteoporosis without current pathological fracture    Rationale for Evaluation and Treatment: Rehabilitation  THERAPY DIAG:  Low back pain, muscle weakness   ONSET DATE: chronic   SUBJECTIVE:  SUBJECTIVE STATEMENT: Pt state she is having increased pain. PERTINENT HISTORY:  OA, osteoporosis, PVD, adult scoliosis, spinal fx in 2013   PAIN:  Are you having pain? Yes: NPRS scale: 8/10, worst pain is a 12; best 5 Pain location: Rt low back  Pain description: constant Aggravating factors: sitting and standing  Relieving factors: lying down, Tylenol  PRECAUTIONS: Other: osteoporosis.   WEIGHT BEARING RESTRICTIONS: No  FALLS:  Has patient fallen in last 6 months? No  LIVING ENVIRONMENT: Lives with: lives with their family Lives in: House/apartment Stairs: Yes: External: 2 steps; on right going up goes one at a time  Has following equipment at home:  None  OCCUPATION: retired   PLOF: Independent  PATIENT GOALS:  less pain and to tolerate activity better.   NEXT MD VISIT: Jan  OBJECTIVE:   DIAGNOSTIC FINDINGS:  osteoporosis    POSTURE: rounded shoulders, forward head, decreased lumbar lordosis, and increased thoracic kyphosis  PALPATION: Tight paraspinal mm   LUMBAR ROM:   AROM eval  Flexion Not tested due to osteoporosis   Extension   Right lateral flexion   Left lateral flexion   Right rotation   Left rotation    (Blank rows = not tested)   LOWER EXTREMITY MMT:    MMT Right eval Left eval  Hip flexion 5 4+  Hip extension 3- 3-  Hip abduction 3- 3-  Hip adduction    Hip internal rotation    Hip external rotation    Knee flexion    Knee extension    Ankle dorsiflexion    Ankle plantarflexion    Ankle inversion    Ankle eversion     (Blank rows = not tested)    FUNCTIONAL TESTS:  30 seconds chair stand test:  12    TODAY'S TREATMENT:                                                                                                                              DATE:  03/07/22 Standing:  Wall arch x 10 Standing against wall with B UE flexion x 10 Postural exercises with green theraband: scapular retraction, row and extension Prone w back x 10 Prone shoulder extension 2# x 10 I Y and T's x 10 each  Quad stretch x 3 B  Supine:  Bridge x 15 holding x 5" Hamstring stretch x 3 B  Scapular lift x 5 Scapular retraction/protraction 2# x 10  Manual to paraspinal in thoracic and lumbar area.  02/27/22 Standing:  Wall arch with heel raise x 10 Marching 10x 3" light 1 UE support Side stepping x 2 RT RTB around thigh STS eccentric control RTB around thigh to reduce valgus 10x Palloff 10x RTB both directions Seated: X to V 10  POE manual to deceased mm tension and improve pain to include efflurage and pettrisage with noted tightness in B paraspinal mm in thoracic and lumbar  area.  02/06/22 Standing:  Wall  arch x 10 Postural exercises with green theraband: scapular retraction, row and extension Side stepping x 2 RT  PNF II red t-band x 10 Sitting : W back with 2# Money x 10  X to V x10 POE manual to deceased mm tension and improve pain to include efflurage and pettrisage with noted tightness in B paraspinal mm in thoracic and lumbar area. 02/05/22: Sitting: Cervical retraction x 10 Scapular retraction x 10 Money x 10  X to V x10 PNF II red t-band x 10  Supine: Bridge Scapular retraction x 10 Squeeze a little more x10 POE manual to deceased mm tension and improve pain to include efflurage and pettrisage with noted tightness in B paraspinal mm in thoracic and lumbar area.   PATIENT EDUCATION:  Education details: decompression and posture exercises  Person educated: Patient Education method: Explanation Education comprehension: verbalized understanding and returned demonstration  HOME EXERCISE PROGRAM: 03/07/2021 Access Code: [email or web address] URL: [email or web address] Date: 03/07/2022 Prepared by: Rayetta Humphrey  Exercises - Prone Scapular Retraction Arms at Side  - 1 x daily - 7 x weekly - 1 sets - 10 reps - 1 hold - Prone Single Arm Shoulder Y  - 1 x daily - 7 x weekly - 1 sets - 10 reps - 1 hold - Prone I Scapular Retraction with Arms Overhead  - 1 x daily - 7 x weekly - 1 sets - 10 reps - 1 hold - Chest and Bicep Stretch - Arms Behind Back  - 1 x daily - 7 x weekly - 1 sets - 10 reps - 1 hold 10/29:  Hand out on body mechanics. W-back,x to V, money, chair push up and decompression exercises with thera-band.   10/28 Decompression Abdominal set   ASSESSMENT:  CLINICAL IMPRESSION: Added to prone exercises with HEP updated.  Began postural exercises with thera-band which was going to be given as HEP, however, pt needed to many cues for proper technique.  PT main pain is in lower back today which is chronic.   OBJECTIVE  IMPAIRMENTS: decreased activity tolerance, decreased mobility, difficulty walking, decreased ROM, decreased strength, improper body mechanics, postural dysfunction, and pain.   ACTIVITY LIMITATIONS: carrying, lifting, bending, sitting, standing, squatting, stairs, and locomotion level  PARTICIPATION LIMITATIONS: cleaning, laundry, shopping, and community activity  PERSONAL FACTORS: Fitness, Past/current experiences, Time since onset of injury/illness/exacerbation, and 1-2 comorbidities: hx of fx and osteoporosis  are also affecting patient's functional outcome.   REHAB POTENTIAL: Good  CLINICAL DECISION MAKING: Evolving/moderate complexity  EVALUATION COMPLEXITY: Moderate   GOALS: Goals reviewed with patient? No  SHORT TERM GOALS: Target date: 01/27/22  Pt to be I in HEP to assist in decreasing pain to no greater than a 7/10 Baseline: Goal status: MET  2.  Pt to be more aware of her posture to assist in decreasing pain.  Baseline:  Goal status: IN PROGRESS    LONG TERM GOALS:                     1. Pt to be I in advanced HEP in order to decrease her back pain to no greater than a 4/10                         Goal status: In progress                     2.  PT LE strength to be increased one grade to be able  to go up and down steps in a reciprocal manner             Goal status:   In progress              3.  PT to be able to be on her feet, (standing/walking) x 30 minutes without increased pain for shopping            Goal status:  In progress  PLAN:  PT FREQUENCY: 3x/wk; 2 x week for 4 more weeks   PT DURATION: 1 weeks 4 weeks   PLANNED INTERVENTIONS: Therapeutic exercises, Therapeutic activity, Patient/Family education, Self Care, and Manual therapy.  PLAN FOR NEXT SESSION: Continue with lumbar stabilization and stretches keeping in mind that pt has osteoporosis.  Rayetta Humphrey, [place] CLT [phone] 03/07/2022 1350

## 2022-03-12 DIAGNOSIS — M810 Age-related osteoporosis without current pathological fracture: Secondary | ICD-10-CM | POA: Diagnosis not present

## 2022-03-12 DIAGNOSIS — E890 Postprocedural hypothyroidism: Secondary | ICD-10-CM | POA: Diagnosis not present

## 2022-03-12 DIAGNOSIS — E0500 Thyrotoxicosis with diffuse goiter without thyrotoxic crisis or storm: Secondary | ICD-10-CM | POA: Diagnosis not present

## 2022-03-13 ENCOUNTER — Telehealth (HOSPITAL_COMMUNITY): Payer: Self-pay | Admitting: Physical Therapy

## 2022-03-13 ENCOUNTER — Encounter (HOSPITAL_COMMUNITY): Payer: Medicare Other | Admitting: Physical Therapy

## 2022-03-13 DIAGNOSIS — X32XXXD Exposure to sunlight, subsequent encounter: Secondary | ICD-10-CM | POA: Diagnosis not present

## 2022-03-13 DIAGNOSIS — L218 Other seborrheic dermatitis: Secondary | ICD-10-CM | POA: Diagnosis not present

## 2022-03-13 DIAGNOSIS — C44311 Basal cell carcinoma of skin of nose: Secondary | ICD-10-CM | POA: Diagnosis not present

## 2022-03-13 DIAGNOSIS — L821 Other seborrheic keratosis: Secondary | ICD-10-CM | POA: Diagnosis not present

## 2022-03-13 DIAGNOSIS — L570 Actinic keratosis: Secondary | ICD-10-CM | POA: Diagnosis not present

## 2022-03-13 NOTE — Telephone Encounter (Signed)
First no show: PT states that she totally forgot and will come tomorrow instead.

## 2022-03-14 ENCOUNTER — Ambulatory Visit: Payer: Medicare Other | Admitting: Internal Medicine

## 2022-03-14 ENCOUNTER — Encounter (HOSPITAL_COMMUNITY): Payer: Self-pay | Admitting: Physical Therapy

## 2022-03-15 ENCOUNTER — Ambulatory Visit (HOSPITAL_COMMUNITY): Payer: Medicare Other | Admitting: Physical Therapy

## 2022-03-15 ENCOUNTER — Ambulatory Visit: Payer: Medicare Other | Attending: Internal Medicine | Admitting: Internal Medicine

## 2022-03-15 ENCOUNTER — Encounter: Payer: Self-pay | Admitting: Internal Medicine

## 2022-03-15 VITALS — BP 141/79 | HR 97 | Ht 63.0 in | Wt 105.0 lb

## 2022-03-15 DIAGNOSIS — I6522 Occlusion and stenosis of left carotid artery: Secondary | ICD-10-CM

## 2022-03-15 DIAGNOSIS — M5459 Other low back pain: Secondary | ICD-10-CM | POA: Diagnosis not present

## 2022-03-15 DIAGNOSIS — M6281 Muscle weakness (generalized): Secondary | ICD-10-CM | POA: Diagnosis not present

## 2022-03-15 NOTE — Therapy (Addendum)
OUTPATIENT PHYSICAL THERAPY THORACOLUMBAR Treatment  Patient Name: Belinda Clark MRN: [id number] DOB:01/21/1941, 82 y.o., female Today's Date: 03/15/2022  END OF SESSION:   PT End of Session - 03/15/22 1647    Visit Number 8    Number of Visits 12    Date for PT Re-Evaluation 04/26/22    Authorization Type [employer] medicare    Progress Note Due on Visit 10    PT Start Time 1615   pt late   PT Stop Time 1645    PT Time Calculation (min) 30 min    Activity Tolerance Patient tolerated treatment well    Behavior During Therapy WFL for tasks assessed/performed                      Past Medical History:  Diagnosis Date   Anxiety    Arthritis    Asthma    Carotid artery stenosis    40-59 % -Left   GERD (gastroesophageal reflux disease)    Glaucoma    High blood pressure    HTN (hypertension)    Hyperlipidemia    Hypothyroidism    PONV (postoperative nausea and vomiting)    Past Surgical History:  Procedure Laterality Date   BREAST BIOPSY     x3   CATARACT EXTRACTION W/ INTRAOCULAR LENS  IMPLANT, BILATERAL     COLONOSCOPY N/A 10/05/2013   Procedure: COLONOSCOPY;  Surgeon: Jamesetta So, MD;  [facility];  Service: Gastroenterology;  Laterality: N/A;   ENDARTERECTOMY Left 05/19/2017   Procedure: ENDARTERECTOMY CAROTID LEFT;  Surgeon: Rosetta Posner, MD;  Location: [facility] OR;  Service: Vascular;  Laterality: Left;   FRACTURE SURGERY     Spinal Fx 06/30/11   NECK MASS EXCISION     PATCH ANGIOPLASTY Left 05/19/2017   Procedure: PATCH ANGIOPLASTY LEFT CAROTID ARTERY USING HEMASHIELD PLATINUM FINESSE PATCH;  Surgeon: Rosetta Posner, MD;  Location: [place];  Service: Vascular;  Laterality: Left;   RHINOPLASTY     3 total surgeries   SPINE SURGERY  06/30/2011   pt states surgery was 2002    THYROIDECTOMY     TONSILLECTOMY     Patient Active Problem List   Diagnosis Date Noted   Postsurgical hypothyroidism 04/06/2021   Iatrogenic hyperthyroidism 04/06/2021    Primary hypertension 03/15/2021   Status post left carotid endarterectomy 03/15/2021   Degenerative scoliosis in adult patient 03/31/2019   Body mass index (BMI) 19.9 or less, adult 03/31/2019   Low back pain 03/31/2019   Frontal fibrosing alopecia 78/58/8502   Lichen planus 77/41/2878   Compression fracture of lumbar spine, non-traumatic ([place]) 06/23/2018   Hypothyroidism 02/23/2018   Age-related osteoporosis without current pathological fracture 07/07/2017   Lumbar herniated disc 03/12/2017   SOB (shortness of breath) 05/02/2014   Fullness of breast 02/14/2014   Hyperlipidemia 11/04/2012   Coronary artery disease involving native coronary artery 01/04/2011   PVD (peripheral vascular disease) ([place]) 01/04/2011   ANKLE SPRAIN 04/30/2007    PCP: Dr. Gerarda Fraction  REFERRING PROVIDER: Delrae Rend, MD  REFERRING DIAG:  Diagnosis  M81.0 (ICD-10-CM) - Age-related osteoporosis without current pathological fracture    Rationale for Evaluation and Treatment: Rehabilitation  THERAPY DIAG:  Low back pain, muscle weakness   ONSET DATE: chronic   SUBJECTIVE:  SUBJECTIVE STATEMENT: PT states that she is late due to leaving a MD appointment late.  She is doing her exercises every other day.  She is in quite a bit of pain today.  PERTINENT HISTORY:  OA, osteoporosis, PVD, adult scoliosis, spinal fx in 2013   PAIN:  Are you having pain? Yes: NPRS scale: 8/10, worst pain is a 12; best 5 Pain location: Rt low back  Pain description: constant Aggravating factors: sitting and standing  Relieving factors: lying down, Tylenol  PRECAUTIONS: Other: osteoporosis.   WEIGHT BEARING RESTRICTIONS: No  FALLS:  Has patient fallen in last 6 months? No  LIVING ENVIRONMENT: Lives with: lives with their family Lives in:  House/apartment Stairs: Yes: External: 2 steps; on right going up goes one at a time  Has following equipment at home: None  OCCUPATION: retired   PLOF: Independent  PATIENT GOALS:  less pain and to tolerate activity better.   NEXT MD VISIT: Jan  OBJECTIVE:   DIAGNOSTIC FINDINGS:  osteoporosis    POSTURE: rounded shoulders, forward head, decreased lumbar lordosis, and increased thoracic kyphosis  PALPATION: Tight paraspinal mm   LUMBAR ROM:   AROM eval  Flexion Not tested due to osteoporosis   Extension   Right lateral flexion   Left lateral flexion   Right rotation   Left rotation    (Blank rows = not tested)   LOWER EXTREMITY MMT:    MMT Right eval Left eval  Hip flexion 5 4+  Hip extension 3- 3-  Hip abduction 3- 3-  Hip adduction    Hip internal rotation    Hip external rotation    Knee flexion    Knee extension    Ankle dorsiflexion    Ankle plantarflexion    Ankle inversion    Ankle eversion     (Blank rows = not tested)    FUNCTIONAL TESTS:  30 seconds chair stand test:  12    TODAY'S TREATMENT:                                                                                                                              DATE:  03/15/2022 Prone: POE x 3 holding for 5 breaths Prone trunk lift x 5 Prone double arm raise x 10 Prone single arm raise x 10 Opposite arm/leg raise x 10 Rows with 2 # x 15 W back lift x 10  Standing: T band green shoulder extension 10 Rows with green theraband x 10   03/07/22 Standing:  Wall arch x 10 Standing against wall with B UE flexion x 10 Postural exercises with green theraband: scapular retraction, row and extension Prone w back x 10 Prone shoulder extension 2# x 10 I Y and T's x 10 each  Quad stretch x 3 B  Supine:  Bridge x 15 holding x 5" Hamstring stretch x 3 B  Scapular lift x 5 Scapular retraction/protraction 2# x 10  Manual to paraspinal  in thoracic and lumbar  area.  02/27/22 Standing:  Wall arch with heel raise x 10 Marching 10x 3" light 1 UE support Side stepping x 2 RT RTB around thigh STS eccentric control RTB around thigh to reduce valgus 10x Palloff 10x RTB both directions Seated: X to V 10  POE manual to deceased mm tension and improve pain to include efflurage and pettrisage with noted tightness in B paraspinal mm in thoracic and lumbar area.  02/06/22 Standing:  Wall arch x 10 Postural exercises with green theraband: scapular retraction, row and extension Side stepping x 2 RT  PNF II red t-band x 10 Sitting : W back with 2# Money x 10  X to V x10 POE manual to deceased mm tension and improve pain to include efflurage and pettrisage with noted tightness in B paraspinal mm in thoracic and lumbar area. 02/05/22: Sitting: Cervical retraction x 10 Scapular retraction x 10 Money x 10  X to V x10 PNF II red t-band x 10  Supine: Bridge Scapular retraction x 10 Squeeze a little more x10 POE manual to deceased mm tension and improve pain to include efflurage and pettrisage with noted tightness in B paraspinal mm in thoracic and lumbar area.   PATIENT EDUCATION:  Education details: decompression and posture exercises  Person educated: Patient Education method: Explanation Education comprehension: verbalized understanding and returned demonstration  HOME EXERCISE PROGRAM: 03/15/2021 Trunk lift x 10 Opposite arm leg raise x 10  03/07/2021 Access Code: [email or web address] URL: [email or web address] Date: 03/07/2022 Prepared by: Rayetta Humphrey  Exercises - Prone Scapular Retraction Arms at Side  - 1 x daily - 7 x weekly - 1 sets - 10 reps - 1 hold - Prone Single Arm Shoulder Y  - 1 x daily - 7 x weekly - 1 sets - 10 reps - 1 hold - Prone I Scapular Retraction with Arms Overhead  - 1 x daily - 7 x weekly - 1 sets - 10 reps - 1 hold - Chest and Bicep Stretch - Arms Behind Back  - 1 x daily - 7 x weekly - 1 sets - 10  reps - 1 hold 10/29:  Hand out on body mechanics. W-back,x to V, money, chair push up and decompression exercises with thera-band.   10/28 Decompression Abdominal set   ASSESSMENT:  CLINICAL IMPRESSION: PT is in the process of moving to [facility] and has not been able to come to therapy on a consistent basis as she is living in [place] part time at the present.  PT will continue to benefit from education on HEP to assist in strengthening Low back area and decreasing pain, therefore recommend to extend cert date to allow pt to have all 12 visits.    Pt pain decreased with heat.  Therapist recommended that she uses heat at home to assist in decreasing pain and tension.   Added exercises to HEP updated.  PT main pain is in lower back today but decreased with therapy.  OBJECTIVE IMPAIRMENTS: decreased activity tolerance, decreased mobility, difficulty walking, decreased ROM, decreased strength, improper body mechanics, postural dysfunction, and pain.   ACTIVITY LIMITATIONS: carrying, lifting, bending, sitting, standing, squatting, stairs, and locomotion level  PARTICIPATION LIMITATIONS: cleaning, laundry, shopping, and community activity  PERSONAL FACTORS: Fitness, Past/current experiences, Time since onset of injury/illness/exacerbation, and 1-2 comorbidities: hx of fx and osteoporosis  are also affecting patient's functional outcome.   REHAB POTENTIAL: Good  CLINICAL DECISION MAKING: Evolving/moderate complexity  EVALUATION COMPLEXITY: Moderate   GOALS: Goals  reviewed with patient? No  SHORT TERM GOALS: Target date: 01/27/22  Pt to be I in HEP to assist in decreasing pain to no greater than a 7/10 Baseline: Goal status: MET  2.  Pt to be more aware of her posture to assist in decreasing pain.  Baseline:  Goal status: MET    LONG TERM GOALS:                     1. Pt to be I in advanced HEP in order to decrease her back pain to no greater than a 4/10                         Goal  status: In progress                     2.  PT LE strength to be increased one grade to be able to go up and down steps in a reciprocal manner             Goal status:   In progress              3.  PT to be able to be on her feet, (standing/walking) x 30 minutes without increased pain for shopping            Goal status:  In progress  PLAN:  PT FREQUENCY: 3x/wk; 2 x week for 4 more weeks   PT DURATION: 1 weeks 4 weeks   PLANNED INTERVENTIONS: Therapeutic exercises, Therapeutic activity, Patient/Family education, Self Care, and Manual therapy.  PLAN FOR NEXT SESSION: Continue with lumbar stabilization and stretches keeping in mind that pt has osteoporosis.  Rayetta Humphrey, [place] CLT [phone]  03/15/2022 [phone]

## 2022-03-15 NOTE — Progress Notes (Addendum)
Cardiology Office Note  Date: 03/15/2022   ID: Belinda Clark, DOB 04/06/1940, MRN [id number]  PCP:  Belinda School, MD  Cardiologist:  None Electrophysiologist:  None   Reason for Office Visit: Follow-up of elevated coronary calcium score   History of Present Illness: Belinda Clark is a 82 y.o. female known to have carotid artery stenosis s/p left carotid endarterectomy with residual 1 to 39% bilateral carotid artery stenosis (followed by vascular surgery), elevated coronary calcium score 1400 in 2014 (nuclear stress test was normal), HTN, HLD presented to cardiology clinic for follow-up visit.  Patient denies any angina, DOE (has baseline SOB but not recently worsening), dizziness, lightness, syncope, claudication, palpitations and leg swelling. She has GERD for which she takes omeprazole 20 mg twice daily and has no edition symptoms not relieved by PPI. She has fatigue and thinks it is because of her old age but does not have unusual fatigue but she cannot perform any of her daily activities.  Past Medical History:  Diagnosis Date   Anxiety    Arthritis    Asthma    Carotid artery stenosis    40-59 % -Left   GERD (gastroesophageal reflux disease)    Glaucoma    High blood pressure    HTN (hypertension)    Hyperlipidemia    Hypothyroidism    PONV (postoperative nausea and vomiting)     Past Surgical History:  Procedure Laterality Date   BREAST BIOPSY     x3   CATARACT EXTRACTION W/ INTRAOCULAR LENS  IMPLANT, BILATERAL     COLONOSCOPY N/A 10/05/2013   Procedure: COLONOSCOPY;  Surgeon: Belinda So, MD;  Location: [facility];  Service: Gastroenterology;  Laterality: N/A;   ENDARTERECTOMY Left 05/19/2017   Procedure: ENDARTERECTOMY CAROTID LEFT;  Surgeon: Belinda Posner, MD;  Location: [facility] OR;  Service: Vascular;  Laterality: Left;   FRACTURE SURGERY     Spinal Fx 06/30/11   NECK MASS EXCISION     PATCH ANGIOPLASTY Left 05/19/2017   Procedure: PATCH ANGIOPLASTY LEFT  CAROTID ARTERY USING HEMASHIELD PLATINUM FINESSE PATCH;  Surgeon: Belinda Posner, MD;  Location: [facility];  Service: Vascular;  Laterality: Left;   RHINOPLASTY     3 total surgeries   SPINE SURGERY  06/30/2011   pt states surgery was 2002    THYROIDECTOMY     TONSILLECTOMY      Current Outpatient Medications  Medication Sig Dispense Refill   albuterol (VENTOLIN HFA) 108 (90 Base) MCG/ACT inhaler Inhale into the lungs every 6 (six) hours as needed for wheezing or shortness of breath.     amLODipine (NORVASC) 5 MG tablet Take 5 mg by mouth daily.     Ascorbic Acid (VITAMIN C) 1000 MG tablet Take 1,000 mg by mouth at bedtime.     atorvastatin (LIPITOR) 40 MG tablet Take 40 mg by mouth every evening.     calcium carbonate (OS-CAL) 600 MG TABS Take 1,200 mg by mouth at bedtime.      calcium carbonate (TUMS - DOSED IN MG ELEMENTAL CALCIUM) 500 MG chewable tablet Chew 1 tablet by mouth daily as needed for indigestion or heartburn.     chlorthalidone (HYGROTON) 25 MG tablet Take 25 mg by mouth daily.     Cholecalciferol 25 MCG (1000 UT) tablet Take 2,000 Units by mouth at bedtime.      clopidogrel (PLAVIX) 75 MG tablet Take 75 mg by mouth daily.     colchicine 0.6 MG tablet Take 0.6  mg by mouth daily.     enalapril (VASOTEC) 20 MG tablet Take 2 tablets (40 mg total) by mouth daily. 425 tablet 3   folic acid (FOLVITE) 956 MCG tablet Take 800 mcg by mouth at bedtime.     Garlic 3875 MG CAPS Take 1,000 mg by mouth at bedtime.     ibuprofen (ADVIL,MOTRIN) 200 MG tablet Take 400 mg by mouth every 4 (four) hours as needed for pain.      levothyroxine (SYNTHROID) 100 MCG tablet Take 100 mcg by mouth every morning.     levothyroxine (SYNTHROID) 75 MCG tablet Take 1 tablet (75 mcg total) by mouth daily before breakfast. 90 tablet 3   LORazepam (ATIVAN) 0.5 MG tablet Take 0.5 mg by mouth 3 (three) times daily as needed for sleep.      Methylcobalamin (B-12) 5000 MCG TBDP Take 5,000 mcg by mouth at bedtime.      metoprolol succinate (TOPROL-XL) 25 MG 24 hr tablet Take 50 mg by mouth daily.     montelukast (SINGULAIR) 10 MG tablet Take 10 mg by mouth daily.     Multiple Vitamin (MULTIVITAMIN) capsule Take 1 capsule by mouth daily.     Omega 3 1000 MG CAPS Take 1,000 mg by mouth at bedtime.     omeprazole (PRILOSEC) 20 MG capsule Take 20 mg by mouth 2 (two) times daily.     PROAIR HFA 108 (90 BASE) MCG/ACT inhaler Inhale 2 puffs into the lungs every 6 (six) hours as needed for wheezing or shortness of breath.      pyridOXINE (VITAMIN B-6) 100 MG tablet Take 100 mg by mouth daily.     traZODone (DESYREL) 50 MG tablet Take 50 mg by mouth at bedtime. Take 3 tablets qhs     vitamin E 400 UNIT capsule Take 400 Units by mouth at bedtime.     zoledronic acid (RECLAST) 5 MG/100ML SOLN injection Inject 5 mg into the vein once.     No current facility-administered medications for this visit.   Allergies:  Ciprofloxacin, Cleocin  [clindamycin], Cleocin [clindamycin hcl], Clindamycin hcl, Iodinated contrast media, Sulfa antibiotics, Ceftin  [cefuroxime], Cefuroxime axetil, Clarithromycin, Clindamycin phosphate, Codeine, Cymbalta [duloxetine hcl], Doxycycline, Iohexol, Levofloxacin, Metoclopramide hcl, Other, Penicillin g, and Penicillins   Social History: The patient  reports that she quit smoking about 28 years ago. Her smoking use included cigarettes. She has a 30.00 pack-year smoking history. She has never used smokeless tobacco. She reports that she does not drink alcohol and does not use drugs.   Family History: The patient's family history includes Diabetes in her mother and another family member; Heart attack in her brother and father; Heart disease in her brother, father, and another family member; Hyperlipidemia in her brother, father, and mother; Hypertension in her mother; Stroke in her brother and maternal grandmother.   ROS:  Please see the history of present illness. Otherwise, complete review of  systems is positive for none.  All other systems are reviewed and negative.   Physical Exam: VS:  Ht '5\' 3"'$  (1.6 m)   Wt 105 lb (47.6 kg)   BMI 18.60 kg/m , BMI Body mass index is 18.6 kg/m.  Wt Readings from Last 3 Encounters:  03/15/22 105 lb (47.6 kg)  12/18/21 107 lb (48.5 kg)  11/28/21 108 lb 6.4 oz (49.2 kg)    General: Patient appears comfortable at rest. HEENT: Conjunctiva and lids normal, oropharynx clear with moist mucosa. Neck: Supple, no elevated JVP or carotid bruits,  no thyromegaly. Lungs: Clear to auscultation, nonlabored breathing at rest. Cardiac: Regular rate and rhythm, no S3 or significant systolic murmur, no pericardial rub. Abdomen: Soft, nontender, no hepatomegaly, bowel sounds present, no guarding or rebound. Extremities: No pitting edema, distal pulses 2+. Skin: Warm and dry. Musculoskeletal: No kyphosis. Neuropsychiatric: Alert and oriented x3, affect grossly appropriate.  ECG:  An ECG dated 03/15/2022 was personally reviewed today and demonstrated:  NSR and Q waves in V1 to V3, old.  Recent Labwork: 10/16/2021: TSH 4.560  No results found for: "CHOL", "TRIG", "HDL", "CHOLHDL", "VLDL", "LDLCALC", "LDLDIRECT"  Other Studies Reviewed Today:   Assessment and Plan: Patient is a 82 year old F known to have carotid artery stenosis s/p left carotid endarterectomy with residual 1 to 39% bilateral carotid artery stenosis (followed by vascular surgery), elevated coronary calcium score 1400 in 2014 (nuclear stress test was normal), HTN, HLD presented to cardiology clinic for follow-up visit.  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 If she develops any symptoms in the future, she  is instructed to call clinic for stress test or go to the ER depending on the severity of the symptoms. She voiced understanding.  # Carotid artery stenosis s/p left carotid endarterectomy with residual 1 to 39% bilateral carotid artery stenosis -Continue Plavix monotherapy and high intensity statin -Follow-up with vascular surgery  # HLD -Continue atorvastatin 40 mg nightly.  Goal LDL less than 70.  # HTN, controlled -Patient is currently on 4 antihypertensive medications and has been feeling fatigue, thinks it could be from her old age. -Continue current antihypertensive medications, amlodipine 5 mg once daily, enalapril 40 mg once daily, metoprolol succinate 50 mg once daily and chlorthalidone 25 mg once daily.  Instructed patient to check her blood pressures in a.m. and p.m. and bring BP log in the next clinic visit. Probably I can wean her off some of her antihypertensive medications based on her home blood pressures and see if her fatigue resolves.  I have spent a total of 33 minutes with patient reviewing chart, EKGs, labs and examining patient as well as establishing an assessment and plan that was discussed with the patient.  > 50% of time was spent in direct patient care.     Medication Adjustments/Labs and Tests Ordered: Current medicines are reviewed at length with the patient today.  Concerns regarding medicines are outlined above.   Tests Ordered: Orders Placed This Encounter  Procedures   EKG 12-Lead    Medication Changes: No orders of the defined types were placed in this encounter.   Disposition:  Follow up  3 months  Signed, Aniketh Huberty Fidel Levy, MD, 03/15/2022 3:41 PM    [facility] [place] [facility] [place] [address], [place],  [address]

## 2022-03-15 NOTE — Patient Instructions (Signed)
Medication Instructions:  Your physician recommends that you continue on your current medications as directed. Please refer to the Current Medication list given to you today.  *If you need a refill on your cardiac medications before your next appointment, please call your pharmacy*   Lab Work: NONE   If you have labs (blood work) drawn today and your tests are completely normal, you will receive your results only by: [place] (if you have MyChart) OR A paper copy in the mail If you have any lab test that is abnormal or we need to change your treatment, we will call you to review the results.   Testing/Procedures: NONE    Follow-Up: At [facility], you and your health needs are our priority.  As part of our continuing mission to provide you with exceptional heart care, we have created designated Provider Care Teams.  These Care Teams include your primary Cardiologist (physician) and Advanced Practice Providers (APPs -  Physician Assistants and Nurse Practitioners) who all work together to provide you with the care you need, when you need it.  We recommend signing up for the patient portal called "MyChart".  Sign up information is provided on this After Visit Summary.  MyChart is used to connect with patients for Virtual Visits (Telemedicine).  Patients are able to view lab/test results, encounter notes, upcoming appointments, etc.  Non-urgent messages can be sent to your provider as well.   To learn more about what you can do with MyChart, go to [email or web address].    Your next appointment:   3 month(s)  Provider:   Claudina Lick, MD    Other Instructions Thank you for choosing [facility]!

## 2022-03-19 NOTE — Addendum Note (Signed)
Addended by: Leeroy Cha on: 03/19/2022 09:43 AM   Modules accepted: Orders

## 2022-03-25 ENCOUNTER — Telehealth: Payer: Self-pay | Admitting: Orthopaedic Surgery

## 2022-03-25 NOTE — Telephone Encounter (Signed)
Returned the patient's call, lvm for her to call us.

## 2022-04-06 DIAGNOSIS — R079 Chest pain, unspecified: Secondary | ICD-10-CM | POA: Diagnosis not present

## 2022-04-06 DIAGNOSIS — S22000A Wedge compression fracture of unspecified thoracic vertebra, initial encounter for closed fracture: Secondary | ICD-10-CM | POA: Diagnosis not present

## 2022-04-06 DIAGNOSIS — M546 Pain in thoracic spine: Secondary | ICD-10-CM | POA: Diagnosis not present

## 2022-04-10 DIAGNOSIS — Z681 Body mass index (BMI) 19 or less, adult: Secondary | ICD-10-CM | POA: Diagnosis not present

## 2022-04-10 DIAGNOSIS — S32000A Wedge compression fracture of unspecified lumbar vertebra, initial encounter for closed fracture: Secondary | ICD-10-CM | POA: Diagnosis not present

## 2022-04-10 DIAGNOSIS — J449 Chronic obstructive pulmonary disease, unspecified: Secondary | ICD-10-CM | POA: Diagnosis not present

## 2022-04-10 DIAGNOSIS — I10 Essential (primary) hypertension: Secondary | ICD-10-CM | POA: Diagnosis not present

## 2022-04-15 DIAGNOSIS — M810 Age-related osteoporosis without current pathological fracture: Secondary | ICD-10-CM | POA: Diagnosis not present

## 2022-04-15 DIAGNOSIS — E890 Postprocedural hypothyroidism: Secondary | ICD-10-CM | POA: Diagnosis not present

## 2022-04-15 DIAGNOSIS — E0500 Thyrotoxicosis with diffuse goiter without thyrotoxic crisis or storm: Secondary | ICD-10-CM | POA: Diagnosis not present

## 2022-04-15 DIAGNOSIS — M8088XA Other osteoporosis with current pathological fracture, vertebra(e), initial encounter for fracture: Secondary | ICD-10-CM | POA: Diagnosis not present

## 2022-04-16 DIAGNOSIS — Z681 Body mass index (BMI) 19 or less, adult: Secondary | ICD-10-CM | POA: Diagnosis not present

## 2022-04-16 DIAGNOSIS — I10 Essential (primary) hypertension: Secondary | ICD-10-CM | POA: Diagnosis not present

## 2022-04-16 DIAGNOSIS — M4856XA Collapsed vertebra, not elsewhere classified, lumbar region, initial encounter for fracture: Secondary | ICD-10-CM | POA: Diagnosis not present

## 2022-04-16 DIAGNOSIS — J449 Chronic obstructive pulmonary disease, unspecified: Secondary | ICD-10-CM | POA: Diagnosis not present

## 2022-04-25 ENCOUNTER — Encounter: Payer: Self-pay | Admitting: Radiology

## 2022-04-25 ENCOUNTER — Other Ambulatory Visit: Payer: Self-pay

## 2022-04-26 ENCOUNTER — Telehealth: Payer: Self-pay | Admitting: Pharmacy Technician

## 2022-04-26 DIAGNOSIS — S22050G Wedge compression fracture of T5-T6 vertebra, subsequent encounter for fracture with delayed healing: Secondary | ICD-10-CM | POA: Diagnosis not present

## 2022-04-26 DIAGNOSIS — M546 Pain in thoracic spine: Secondary | ICD-10-CM | POA: Diagnosis not present

## 2022-04-26 NOTE — Telephone Encounter (Signed)
Auth Submission: NO AUTH NEEDED Payer: [employer] MEDICARE Medication & CPT/J Code(s) submitted: Reclast (Zolendronic acid) [id number] Route of submission (phone, fax, portal):  Phone # Fax # Auth type: Buy/Bill Units/visits requested: 1 Reference number:  Approval from: 04/26/22 to 02/25/23

## 2022-04-30 ENCOUNTER — Other Ambulatory Visit: Payer: Self-pay

## 2022-04-30 ENCOUNTER — Ambulatory Visit (INDEPENDENT_AMBULATORY_CARE_PROVIDER_SITE_OTHER): Payer: Medicare Other | Admitting: Orthopaedic Surgery

## 2022-04-30 ENCOUNTER — Encounter: Payer: Self-pay | Admitting: Orthopaedic Surgery

## 2022-04-30 DIAGNOSIS — M25561 Pain in right knee: Secondary | ICD-10-CM

## 2022-04-30 DIAGNOSIS — G8929 Other chronic pain: Secondary | ICD-10-CM

## 2022-04-30 DIAGNOSIS — I6529 Occlusion and stenosis of unspecified carotid artery: Secondary | ICD-10-CM

## 2022-04-30 NOTE — Progress Notes (Signed)
PROCEDURE NOTE:  The patient requests injections of the right knee , verbal consent was obtained.  The right knee was prepped appropriately after time out was performed.   Sterile technique was observed and injection of 1 cc of DepoMedrol '40mg'$  with several cc's of plain xylocaine. Anesthesia was provided by ethyl chloride and a 20-gauge needle was used to inject the knee area. The injection was tolerated well.  A band aid dressing was applied.  The patient was advised to apply ice later today and tomorrow to the injection sight as needed.  Encounter Diagnosis  Name Primary?   Chronic pain of right knee Yes   Return prn  Call if any problem.  Precautions discussed.  Electronically Signed Sanjuana Kava, MD 3/5/20249:36 AM

## 2022-05-03 DIAGNOSIS — E890 Postprocedural hypothyroidism: Secondary | ICD-10-CM | POA: Diagnosis not present

## 2022-05-03 DIAGNOSIS — E0500 Thyrotoxicosis with diffuse goiter without thyrotoxic crisis or storm: Secondary | ICD-10-CM | POA: Diagnosis not present

## 2022-05-03 DIAGNOSIS — M810 Age-related osteoporosis without current pathological fracture: Secondary | ICD-10-CM | POA: Diagnosis not present

## 2022-05-07 DIAGNOSIS — M5414 Radiculopathy, thoracic region: Secondary | ICD-10-CM | POA: Diagnosis not present

## 2022-05-07 DIAGNOSIS — Z681 Body mass index (BMI) 19 or less, adult: Secondary | ICD-10-CM | POA: Diagnosis not present

## 2022-05-07 DIAGNOSIS — M4856XA Collapsed vertebra, not elsewhere classified, lumbar region, initial encounter for fracture: Secondary | ICD-10-CM | POA: Diagnosis not present

## 2022-05-07 DIAGNOSIS — J449 Chronic obstructive pulmonary disease, unspecified: Secondary | ICD-10-CM | POA: Diagnosis not present

## 2022-05-07 DIAGNOSIS — S22000A Wedge compression fracture of unspecified thoracic vertebra, initial encounter for closed fracture: Secondary | ICD-10-CM | POA: Diagnosis not present

## 2022-05-07 DIAGNOSIS — I10 Essential (primary) hypertension: Secondary | ICD-10-CM | POA: Diagnosis not present

## 2022-05-08 ENCOUNTER — Ambulatory Visit: Payer: Medicare Other | Admitting: Vascular Surgery

## 2022-05-09 DIAGNOSIS — S32000A Wedge compression fracture of unspecified lumbar vertebra, initial encounter for closed fracture: Secondary | ICD-10-CM | POA: Diagnosis not present

## 2022-05-09 DIAGNOSIS — Z681 Body mass index (BMI) 19 or less, adult: Secondary | ICD-10-CM | POA: Diagnosis not present

## 2022-05-09 DIAGNOSIS — S22000A Wedge compression fracture of unspecified thoracic vertebra, initial encounter for closed fracture: Secondary | ICD-10-CM | POA: Diagnosis not present

## 2022-05-09 DIAGNOSIS — I10 Essential (primary) hypertension: Secondary | ICD-10-CM | POA: Diagnosis not present

## 2022-05-09 DIAGNOSIS — M4856XA Collapsed vertebra, not elsewhere classified, lumbar region, initial encounter for fracture: Secondary | ICD-10-CM | POA: Diagnosis not present

## 2022-05-09 DIAGNOSIS — J449 Chronic obstructive pulmonary disease, unspecified: Secondary | ICD-10-CM | POA: Diagnosis not present

## 2022-05-09 DIAGNOSIS — M5414 Radiculopathy, thoracic region: Secondary | ICD-10-CM | POA: Diagnosis not present

## 2022-05-10 DIAGNOSIS — M5414 Radiculopathy, thoracic region: Secondary | ICD-10-CM | POA: Diagnosis not present

## 2022-05-14 DIAGNOSIS — M4854XD Collapsed vertebra, not elsewhere classified, thoracic region, subsequent encounter for fracture with routine healing: Secondary | ICD-10-CM | POA: Diagnosis not present

## 2022-05-14 DIAGNOSIS — R937 Abnormal findings on diagnostic imaging of other parts of musculoskeletal system: Secondary | ICD-10-CM | POA: Diagnosis not present

## 2022-05-16 DIAGNOSIS — S32000A Wedge compression fracture of unspecified lumbar vertebra, initial encounter for closed fracture: Secondary | ICD-10-CM | POA: Diagnosis not present

## 2022-05-16 DIAGNOSIS — J449 Chronic obstructive pulmonary disease, unspecified: Secondary | ICD-10-CM | POA: Diagnosis not present

## 2022-05-16 DIAGNOSIS — S22000A Wedge compression fracture of unspecified thoracic vertebra, initial encounter for closed fracture: Secondary | ICD-10-CM | POA: Diagnosis not present

## 2022-05-16 DIAGNOSIS — M5414 Radiculopathy, thoracic region: Secondary | ICD-10-CM | POA: Diagnosis not present

## 2022-05-17 DIAGNOSIS — S22060G Wedge compression fracture of T7-T8 vertebra, subsequent encounter for fracture with delayed healing: Secondary | ICD-10-CM | POA: Diagnosis not present

## 2022-05-17 DIAGNOSIS — S22050G Wedge compression fracture of T5-T6 vertebra, subsequent encounter for fracture with delayed healing: Secondary | ICD-10-CM | POA: Diagnosis not present

## 2022-05-17 DIAGNOSIS — Z681 Body mass index (BMI) 19 or less, adult: Secondary | ICD-10-CM | POA: Diagnosis not present

## 2022-05-20 ENCOUNTER — Telehealth: Payer: Self-pay | Admitting: *Deleted

## 2022-05-20 NOTE — Telephone Encounter (Signed)
   Pre-operative Risk Assessment    Patient Name: Belinda Clark  DOB: [birth year] MRN: [id number]      Request for Surgical Clearance    Procedure:   KYPHOPLASTY, TORACIC   Date of Surgery:  Clearance TBD                                 Surgeon:  Newman Pies, MD Surgeon's Group or Practice Name:  [facility] Phone number:  [phone] Fax number:  [phone]   Type of Clearance Requested:   - Medical    Type of Anesthesia:  General    Additional requests/questions:    Astrid Divine   05/20/2022, 4:14 PM

## 2022-05-21 ENCOUNTER — Other Ambulatory Visit: Payer: Self-pay | Admitting: Neurosurgery

## 2022-05-21 NOTE — Telephone Encounter (Signed)
Primary Cardiologist:Vishnu P Mallipeddi, MD   Preoperative team, please contact this patient and set up a phone call appointment for further preoperative risk assessment. Please obtain consent and complete medication review. Thank you for your help.   Patient is on Plavix. No request to hold, however if requested, and pending no symptoms of ACS, she may hold Plavix for 5 days prior to procedure per office protocol.    Emmaline Life, NP-C  05/21/2022, 10:00 AM [address], [address] Office [phone] Fax [phone]

## 2022-05-21 NOTE — Telephone Encounter (Signed)
Pt scheduled to see Dr. Dellia Cloud, 06/25/22, clearance will be addressed at that time.  Will route back to the requesting surgeon's office to make them aware.

## 2022-05-22 ENCOUNTER — Ambulatory Visit: Payer: Medicare Other | Attending: Internal Medicine | Admitting: Internal Medicine

## 2022-05-22 ENCOUNTER — Encounter: Payer: Self-pay | Admitting: Internal Medicine

## 2022-05-22 VITALS — BP 136/70 | HR 90 | Ht 63.0 in | Wt 102.0 lb

## 2022-05-22 DIAGNOSIS — I2510 Atherosclerotic heart disease of native coronary artery without angina pectoris: Secondary | ICD-10-CM | POA: Diagnosis not present

## 2022-05-22 DIAGNOSIS — Z01810 Encounter for preprocedural cardiovascular examination: Secondary | ICD-10-CM | POA: Diagnosis not present

## 2022-05-22 MED ORDER — ASPIRIN 81 MG PO TBEC: 81.0000 mg | DELAYED_RELEASE_TABLET | Freq: Every day | ORAL | 3 refills | Status: AC

## 2022-05-22 NOTE — Progress Notes (Signed)
Cardiology Office Note  Date: 05/22/2022   ID: Belinda Clark, DOB 1940/10/07, MRN [id number]  PCP:  Redmond School, MD  Cardiologist:  Chalmers Guest, MD Electrophysiologist:  None   Reason for Office Visit: Preop cardiac risk stratification for kyphoplasty  History of Present Illness: Belinda Clark is a 82 y.o. female known to have carotid artery stenosis s/p left carotid endarterectomy with residual 1 to 39% bilateral carotid artery stenosis (followed by vascular surgery) in 2019, elevated coronary calcium score 1400 in 2014 (nuclear stress test was normal), HTN, HLD presented to cardiology clinic for follow-up visit.   Patient had thoracic spine fracture recently in 03/2022 for which she is scheduled for kyphoplasty in 05/2022. She is here for preop cardiac risk stratification for kyphoplasty procedure. Prior to the fracture, she used to walk with her husband every other day for 1 mile at her own pace with no symptoms of angina or DOE. On the remaining days of the week, she used to go for line dancing. Since the thoracic spine fractures, she has been in excruciating pain despite being on pain medications. She continues to deny any symptoms of angina, DOE.  Denies any dizziness, lightness, syncope, palpitations and leg swelling.  Past Medical History:  Diagnosis Date   Anxiety    Arthritis    Asthma    Carotid artery stenosis    40-59 % -Left   GERD (gastroesophageal reflux disease)    Glaucoma    High blood pressure    HTN (hypertension)    Hyperlipidemia    Hypothyroidism    PONV (postoperative nausea and vomiting)     Past Surgical History:  Procedure Laterality Date   BREAST BIOPSY     x3   CATARACT EXTRACTION W/ INTRAOCULAR LENS  IMPLANT, BILATERAL     COLONOSCOPY N/A 10/05/2013   Procedure: COLONOSCOPY;  Surgeon: Jamesetta So, MD;  Location: [facility];  Service: Gastroenterology;  Laterality: N/A;   ENDARTERECTOMY Left 05/19/2017   Procedure:  ENDARTERECTOMY CAROTID LEFT;  Surgeon: Rosetta Posner, MD;  Location: [facility] OR;  Service: Vascular;  Laterality: Left;   FRACTURE SURGERY     Spinal Fx 06/30/11   NECK MASS EXCISION     PATCH ANGIOPLASTY Left 05/19/2017   Procedure: PATCH ANGIOPLASTY LEFT CAROTID ARTERY USING HEMASHIELD PLATINUM FINESSE PATCH;  Surgeon: Rosetta Posner, MD;  Location: [facility];  Service: Vascular;  Laterality: Left;   RHINOPLASTY     3 total surgeries   SPINE SURGERY  06/30/2011   pt states surgery was 2002    THYROIDECTOMY     TONSILLECTOMY      Current Outpatient Medications  Medication Sig Dispense Refill   albuterol (VENTOLIN HFA) 108 (90 Base) MCG/ACT inhaler Inhale into the lungs every 6 (six) hours as needed for wheezing or shortness of breath.     amLODipine (NORVASC) 5 MG tablet Take 5 mg by mouth daily.     Ascorbic Acid (VITAMIN C) 1000 MG tablet Take 1,000 mg by mouth at bedtime.     [START ON 06/05/2022] aspirin EC 81 MG tablet Take 1 tablet (81 mg total) by mouth daily. Swallow whole. 90 tablet 3   atorvastatin (LIPITOR) 40 MG tablet Take 40 mg by mouth every evening.     calcium carbonate (OS-CAL) 600 MG TABS Take 1,200 mg by mouth at bedtime.      calcium carbonate (TUMS - DOSED IN MG ELEMENTAL CALCIUM) 500 MG chewable tablet Chew 1 tablet  by mouth daily as needed for indigestion or heartburn.     Cholecalciferol 25 MCG (1000 UT) tablet Take 2,000 Units by mouth at bedtime.      clopidogrel (PLAVIX) 75 MG tablet Take 75 mg by mouth daily.     colchicine 0.6 MG tablet Take 0.6 mg by mouth daily.     enalapril (VASOTEC) 20 MG tablet Take 2 tablets (40 mg total) by mouth daily. 99991111 tablet 3   folic acid (FOLVITE) Q000111Q MCG tablet Take 800 mcg by mouth at bedtime.     Garlic 123XX123 MG CAPS Take 1,000 mg by mouth at bedtime.     ibuprofen (ADVIL,MOTRIN) 200 MG tablet Take 400 mg by mouth every 4 (four) hours as needed for pain.      levothyroxine (SYNTHROID) 100 MCG tablet Take 100 mcg by mouth every morning.      levothyroxine (SYNTHROID) 75 MCG tablet Take 1 tablet (75 mcg total) by mouth daily before breakfast. 90 tablet 3   LORazepam (ATIVAN) 0.5 MG tablet Take 0.5 mg by mouth 3 (three) times daily as needed for sleep.      Methylcobalamin (B-12) 5000 MCG TBDP Take 5,000 mcg by mouth at bedtime.     metoprolol succinate (TOPROL-XL) 25 MG 24 hr tablet Take 50 mg by mouth daily.     montelukast (SINGULAIR) 10 MG tablet Take 10 mg by mouth daily.     Multiple Vitamin (MULTIVITAMIN) capsule Take 1 capsule by mouth daily.     Omega 3 1000 MG CAPS Take 1,000 mg by mouth at bedtime.     omeprazole (PRILOSEC) 20 MG capsule Take 20 mg by mouth 2 (two) times daily.     oxyCODONE-acetaminophen (PERCOCET/ROXICET) 5-325 MG tablet Take by mouth every 4 (four) hours as needed for severe pain.     PROAIR HFA 108 (90 BASE) MCG/ACT inhaler Inhale 2 puffs into the lungs every 6 (six) hours as needed for wheezing or shortness of breath.      pyridOXINE (VITAMIN B-6) 100 MG tablet Take 100 mg by mouth daily.     traZODone (DESYREL) 50 MG tablet Take 50 mg by mouth at bedtime. Take 3 tablets qhs     vitamin E 400 UNIT capsule Take 400 Units by mouth at bedtime.     zoledronic acid (RECLAST) 5 MG/100ML SOLN injection Inject 5 mg into the vein once.     No current facility-administered medications for this visit.   Allergies:  Ciprofloxacin, Cleocin  [clindamycin], Cleocin [clindamycin hcl], Clindamycin hcl, Iodinated contrast media, Sulfa antibiotics, Ceftin  [cefuroxime], Cefuroxime axetil, Clarithromycin, Clindamycin phosphate, Codeine, Cymbalta [duloxetine hcl], Doxycycline, Iohexol, Levofloxacin, Metoclopramide hcl, Other, Penicillin g, and Penicillins   Social History: The patient  reports that she quit smoking about 28 years ago. Her smoking use included cigarettes. She has a 30.00 pack-year smoking history. She has never used smokeless tobacco. She reports that she does not drink alcohol and does not use drugs.    Family History: The patient's family history includes Diabetes in her mother and another family member; Heart attack in her brother and father; Heart disease in her brother, father, and another family member; Hyperlipidemia in her brother, father, and mother; Hypertension in her mother; Stroke in her brother and maternal grandmother.   ROS:  Please see the history of present illness. Otherwise, complete review of systems is positive for none.  All other systems are reviewed and negative.   Physical Exam: VS:  BP 136/70 (BP Location: Left Arm,  Patient Position: Sitting, Cuff Size: Normal)   Pulse 90   Ht 5\' 3"  (1.6 m)   Wt 102 lb (46.3 kg)   BMI 18.07 kg/m , BMI Body mass index is 18.07 kg/m.  Wt Readings from Last 3 Encounters:  05/22/22 102 lb (46.3 kg)  03/15/22 105 lb (47.6 kg)  12/18/21 107 lb (48.5 kg)    General: Patient appears comfortable at rest. HEENT: Conjunctiva and lids normal, oropharynx clear with moist mucosa. Neck: Supple, no elevated JVP or carotid bruits, no thyromegaly. Lungs: Clear to auscultation, nonlabored breathing at rest. Cardiac: Regular rate and rhythm, no S3 or significant systolic murmur, no pericardial rub. Abdomen: Soft, nontender, no hepatomegaly, bowel sounds present, no guarding or rebound. Extremities: No pitting edema, distal pulses 2+. Skin: Warm and dry. Musculoskeletal: No kyphosis. Neuropsychiatric: Alert and oriented x3, affect grossly appropriate.  ECG:  An ECG dated 05/22/2022 was personally reviewed today and demonstrated:  NSR, no ST changes  Recent Labwork: 10/16/2021: TSH 4.560  No results found for: "CHOL", "TRIG", "HDL", "CHOLHDL", "VLDL", "LDLCALC", "LDLDIRECT"  Other Studies Reviewed Today:   Assessment and Plan: Patient is a 82 year old F known to have carotid artery stenosis s/p left carotid endarterectomy with residual 1 to 39% bilateral carotid artery stenosis (followed by vascular surgery), elevated coronary calcium  score 1400 in 2014 (nuclear stress test was normal), HTN, HLD presented to cardiology clinic for follow-up visit.   # Preop cardiac risk stratification for kyphoplasty -Patient had more than 4 METS prior to the spine fracture in 03/2022. She never had any angina or DOE or any other anginal equivalent. EKG showed some normal sinus rhythm and no ST changes. Patient is at a moderate risk for any perioperative cardiac complications based on the comorbidities. No further cardiac testing is indicated prior to proceeding with the planned procedure. -Hold Plavix for 7 days prior to the procedure and start aspirin 81 mg after the procedure whenever safe from the bleeding standpoint. Discontinue Plavix after the procedure.  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  Discontinue Plavix after procedure and start aspirin 81 mg once daily -Continue atorvastatin 40 mg nightly  # HLD -Continue atorvastatin 40 mg nightly.  Goal LDL less than 70.  # HTN, controlled -Patient is currently on 3 antihypertensive medications and has been feeling fatigue, wants to come off some of the antihypertensive medications. Will attempt to wean off in the next clinic visit (after her procedure).  For now, continue current antihypertensive medications, amlodipine 5 mg once daily, enalapril 20 mg twice daily and metoprolol succinate 50 mg once daily.  I have spent a total of 30 minutes with patient reviewing chart, EKGs, labs and examining patient as well as establishing an assessment and plan that was discussed with the patient.  > 50% of time was spent in direct patient care.      Medication Adjustments/Labs and Tests Ordered: Current medicines are reviewed at length with the patient today.  Concerns  regarding medicines are outlined above.   Tests Ordered: Orders Placed This Encounter  Procedures   EKG 12-Lead    Medication Changes: Meds ordered this encounter  Medications   aspirin EC 81 MG tablet    Sig: Take 1 tablet (81 mg total) by mouth daily. Swallow whole.  Dispense:  90 tablet    Refill:  3    05/22/2022 start after surgery    Disposition:  Follow up  3 months  Signed Lolah Coghlan Fidel Levy, MD, 05/22/2022 2:43 PM    [facility] [place], [place], [place] [address]

## 2022-05-22 NOTE — Patient Instructions (Addendum)
Medication Instructions:  Your physician has recommended you make the following change in your medication:  Stop plavix 1 week before surgery (05/27/2022). Start aspirin 81 mg after your surgeon says its okay to start it and remain off plavix Continue other medications the same  Labwork: none  Testing/Procedures: none  Follow-Up: Your physician recommends that you schedule a follow-up appointment in: 3 months  Any Other Special Instructions Will Be Listed Below (If Applicable). Night blood pressure is 20 points lower than your morning blood pressure  If you need a refill on your cardiac medications before your next appointment, please call your pharmacy.

## 2022-05-24 NOTE — Pre-Procedure Instructions (Addendum)
Surgical Instructions    Your procedure is scheduled on June 03, 2022.  Report to [facility] Main Entrance "A" at 1:00 P.M., then check in with the Admitting office.  Call this number if you have problems the morning of surgery:  [phone]  If you have any questions prior to your surgery date call [phone]: Open Monday-Friday 8am-4pm If you experience any cold or flu symptoms such as cough, fever, chills, shortness of breath, etc. between now and your scheduled surgery, please notify us at the above number.     Remember:  Do not eat or drink after midnight the night before your surgery     Take these medicines the morning of surgery with A SIP OF WATER:  amLODipine (NORVASC)   levothyroxine (SYNTHROID)   metoprolol succinate (TOPROL-XL)   montelukast (SINGULAIR)   omeprazole (PRILOSEC)     May take these medicines IF NEEDED:  LORazepam (ATIVAN)   oxyCODONE-acetaminophen (PERCOCET)   PROAIR HFA 108 (90 BASE) inhaler    STOP taking your Plavix 7 days prior to surgery. Your last dose of this medication will be March 31st.  As of today, STOP taking any Aspirin, Aleve, Naproxen, Ibuprofen, Motrin, Advil, Goody's, BC's, all herbal medications, fish oil, and all vitamins.                     Do NOT Smoke (Tobacco/Vaping) for 24 hours prior to your procedure.  If you use a CPAP at night, you may bring your mask/headgear for your overnight stay.   Contacts, glasses, piercing's, hearing aid's, dentures or partials may not be worn into surgery, please bring cases for these belongings.    For patients admitted to the hospital, discharge time will be determined by your treatment team.   Patients discharged the day of surgery will not be allowed to drive home, and someone needs to stay with them for 24 hours.  SURGICAL WAITING ROOM VISITATION Patients having surgery or a procedure may have no more than 2 support people in the waiting area - these visitors may rotate.    Children under the age of 105 must have an adult with them who is not the patient. If the patient needs to stay at the hospital during part of their recovery, the visitor guidelines for inpatient rooms apply. Pre-op nurse will coordinate an appropriate time for 1 support person to accompany patient in pre-op.  This support person may not rotate.   Please refer to the [facility] website for the visitor guidelines for Inpatients (after your surgery is over and you are in a regular room).    Special instructions:   [place]- Preparing For Surgery  Before surgery, you can play an important role. Because skin is not sterile, your skin needs to be as free of germs as possible. You can reduce the number of germs on your skin by washing with CHG (chlorahexidine gluconate) Soap before surgery.  CHG is an antiseptic cleaner which kills germs and bonds with the skin to continue killing germs even after washing.    Oral Hygiene is also important to reduce your risk of infection.  Remember - BRUSH YOUR TEETH THE MORNING OF SURGERY WITH YOUR REGULAR TOOTHPASTE  Please do not use if you have an allergy to CHG or antibacterial soaps. If your skin becomes reddened/irritated stop using the CHG.  Do not shave (including legs and underarms) for at least 48 hours prior to first CHG shower. It is OK to shave your face.  Please follow these instructions carefully.   Shower the NIGHT BEFORE SURGERY and the MORNING OF SURGERY  If you chose to wash your hair, wash your hair first as usual with your normal shampoo.  After you shampoo, rinse your hair and body thoroughly to remove the shampoo.  Use CHG Soap as you would any other liquid soap. You can apply CHG directly to the skin and wash gently with a scrungie or a clean washcloth.   Apply the CHG Soap to your body ONLY FROM THE NECK DOWN.  Do not use on open wounds or open sores. Avoid contact with your eyes, ears, mouth and genitals (private parts). Wash Face  and genitals (private parts)  with your normal soap.   Wash thoroughly, paying special attention to the area where your surgery will be performed.  Thoroughly rinse your body with warm water from the neck down.  DO NOT shower/wash with your normal soap after using and rinsing off the CHG Soap.  Pat yourself dry with a CLEAN TOWEL.  Wear CLEAN PAJAMAS to bed the night before surgery  Place CLEAN SHEETS on your bed the night before your surgery  DO NOT SLEEP WITH PETS.   Day of Surgery: Take a shower with CHG soap. Do not wear jewelry or makeup Do not wear lotions, powders, perfumes/colognes, or deodorant. Do not shave 48 hours prior to surgery.  Men may shave face and neck. Do not bring valuables to the hospital.  [facility] is not responsible for any belongings or valuables. Do not wear nail polish, gel polish, artificial nails, or any other type of covering on natural nails (fingers and toes) If you have artificial nails or gel coating that need to be removed by a nail salon, please have this removed prior to surgery. Artificial nails or gel coating may interfere with anesthesia's ability to adequately monitor your vital signs.  Wear Clean/Comfortable clothing the morning of surgery Remember to brush your teeth WITH YOUR REGULAR TOOTHPASTE.   Please read over the following fact sheets that you were given.    If you received a COVID test during your pre-op visit  it is requested that you wear a mask when out in public, stay away from anyone that may not be feeling well and notify your surgeon if you develop symptoms. If you have been in contact with anyone that has tested positive in the last 10 days please notify you surgeon.

## 2022-05-27 ENCOUNTER — Encounter (HOSPITAL_COMMUNITY): Payer: Self-pay

## 2022-05-27 ENCOUNTER — Other Ambulatory Visit: Payer: Self-pay

## 2022-05-27 ENCOUNTER — Encounter (HOSPITAL_COMMUNITY)
Admission: RE | Admit: 2022-05-27 | Discharge: 2022-05-27 | Disposition: A | Discharge status: Home / Self Care | Payer: Medicare Other | Source: Ambulatory Visit | Attending: Neurosurgery | Admitting: Neurosurgery

## 2022-05-27 VITALS — BP 135/75 | HR 87 | Temp 98.8°F | Resp 18 | Ht 63.0 in | Wt 101.0 lb

## 2022-05-27 DIAGNOSIS — I2510 Atherosclerotic heart disease of native coronary artery without angina pectoris: Secondary | ICD-10-CM | POA: Insufficient documentation

## 2022-05-27 DIAGNOSIS — Z01818 Encounter for other preprocedural examination: Secondary | ICD-10-CM

## 2022-05-27 DIAGNOSIS — I10 Essential (primary) hypertension: Secondary | ICD-10-CM | POA: Diagnosis not present

## 2022-05-27 DIAGNOSIS — Z01812 Encounter for preprocedural laboratory examination: Secondary | ICD-10-CM | POA: Diagnosis not present

## 2022-05-27 DIAGNOSIS — E039 Hypothyroidism, unspecified: Secondary | ICD-10-CM | POA: Diagnosis not present

## 2022-05-27 DIAGNOSIS — M4854XG Collapsed vertebra, not elsewhere classified, thoracic region, subsequent encounter for fracture with delayed healing: Secondary | ICD-10-CM | POA: Insufficient documentation

## 2022-05-27 DIAGNOSIS — Z87891 Personal history of nicotine dependence: Secondary | ICD-10-CM | POA: Diagnosis not present

## 2022-05-27 DIAGNOSIS — J45909 Unspecified asthma, uncomplicated: Secondary | ICD-10-CM | POA: Diagnosis not present

## 2022-05-27 DIAGNOSIS — X58XXXD Exposure to other specified factors, subsequent encounter: Secondary | ICD-10-CM | POA: Insufficient documentation

## 2022-05-27 DIAGNOSIS — Z7902 Long term (current) use of antithrombotics/antiplatelets: Secondary | ICD-10-CM | POA: Insufficient documentation

## 2022-05-27 LAB — CBC
HCT: 39.4 % (ref 36.0–46.0)
Hemoglobin: 12.5 g/dL (ref 12.0–15.0)
MCH: 29.4 pg (ref 26.0–34.0)
MCHC: 31.7 g/dL (ref 30.0–36.0)
MCV: 92.7 fL (ref 80.0–100.0)
Platelets: 287 10*3/uL (ref 150–400)
RBC: 4.25 MIL/uL (ref 3.87–5.11)
RDW: 13.6 % (ref 11.5–15.5)
WBC: 5.5 10*3/uL (ref 4.0–10.5)
nRBC: 0 % (ref 0.0–0.2)

## 2022-05-27 LAB — SURGICAL PCR SCREEN
MRSA, PCR: NEGATIVE
Staphylococcus aureus: POSITIVE — AB

## 2022-05-27 LAB — BASIC METABOLIC PANEL
Anion gap: 9 (ref 5–15)
BUN: 14 mg/dL (ref 8–23)
CO2: 27 mmol/L (ref 22–32)
Calcium: 9.5 mg/dL (ref 8.9–10.3)
Chloride: 101 mmol/L (ref 98–111)
Creatinine, Ser: 0.64 mg/dL (ref 0.44–1.00)
GFR, Estimated: >60 mL/min (ref >60)
Glucose, Bld: 84 mg/dL (ref 70–99)
Potassium: 4.1 mmol/L (ref 3.5–5.1)
Sodium: 137 mmol/L (ref 135–145)

## 2022-05-27 NOTE — Pre-Procedure Instructions (Signed)
Surgical Instructions    Your procedure is scheduled on June 03, 2022.  Report to [facility] Main Entrance "A" at 8:00AM., then check in with the Admitting office.  Call this number if you have problems the morning of surgery:  [phone]  If you have any questions prior to your surgery date call [phone]: Open Monday-Friday 8am-4pm If you experience any cold or flu symptoms such as cough, fever, chills, shortness of breath, etc. between now and your scheduled surgery, please notify us at the above number.     Remember:  Do not eat or drink after midnight the night before your surgery     Take these medicines the morning of surgery with A SIP OF WATER:  amLODipine (NORVASC)   levothyroxine (SYNTHROID)   metoprolol succinate (TOPROL-XL)    omeprazole (PRILOSEC)     May take these medicines IF NEEDED:  LORazepam (ATIVAN)   oxyCODONE-acetaminophen (PERCOCET)   PROAIR HFA 108 (90 BASE) inhaler    STOP taking your Plavix 7 days prior to surgery. Your last dose of this medication will be March 31st.  As of today, STOP taking any Aspirin, Aleve, Naproxen, Ibuprofen, Motrin, Advil, Goody's, BC's, all herbal medications, fish oil, and all vitamins.                     Do NOT Smoke (Tobacco/Vaping) for 24 hours prior to your procedure.  If you use a CPAP at night, you may bring your mask/headgear for your overnight stay.   Contacts, glasses, piercing's, hearing aid's, dentures or partials may not be worn into surgery, please bring cases for these belongings.    For patients admitted to the hospital, discharge time will be determined by your treatment team.   Patients discharged the day of surgery will not be allowed to drive home, and someone needs to stay with them for 24 hours.  SURGICAL WAITING ROOM VISITATION Patients having surgery or a procedure may have no more than 2 support people in the waiting area - these visitors may rotate.   Children under the age of 42 must  have an adult with them who is not the patient. If the patient needs to stay at the hospital during part of their recovery, the visitor guidelines for inpatient rooms apply. Pre-op nurse will coordinate an appropriate time for 1 support person to accompany patient in pre-op.  This support person may not rotate.   Please refer to the [facility] website for the visitor guidelines for Inpatients (after your surgery is over and you are in a regular room).    Special instructions:   [place]- Preparing For Surgery  Before surgery, you can play an important role. Because skin is not sterile, your skin needs to be as free of germs as possible. You can reduce the number of germs on your skin by washing with CHG (chlorahexidine gluconate) Soap before surgery.  CHG is an antiseptic cleaner which kills germs and bonds with the skin to continue killing germs even after washing.    Oral Hygiene is also important to reduce your risk of infection.  Remember - BRUSH YOUR TEETH THE MORNING OF SURGERY WITH YOUR REGULAR TOOTHPASTE  Please do not use if you have an allergy to CHG or antibacterial soaps. If your skin becomes reddened/irritated stop using the CHG.  Do not shave (including legs and underarms) for at least 48 hours prior to first CHG shower. It is OK to shave your face.  Please follow these  instructions carefully.   Shower the NIGHT BEFORE SURGERY and the MORNING OF SURGERY  If you chose to wash your hair, wash your hair first as usual with your normal shampoo.  After you shampoo, rinse your hair and body thoroughly to remove the shampoo.  Use CHG Soap as you would any other liquid soap. You can apply CHG directly to the skin and wash gently with a scrungie or a clean washcloth.   Apply the CHG Soap to your body ONLY FROM THE NECK DOWN.  Do not use on open wounds or open sores. Avoid contact with your eyes, ears, mouth and genitals (private parts). Wash Face and genitals (private parts)  with  your normal soap.   Wash thoroughly, paying special attention to the area where your surgery will be performed.  Thoroughly rinse your body with warm water from the neck down.  DO NOT shower/wash with your normal soap after using and rinsing off the CHG Soap.  Pat yourself dry with a CLEAN TOWEL.  Wear CLEAN PAJAMAS to bed the night before surgery  Place CLEAN SHEETS on your bed the night before your surgery  DO NOT SLEEP WITH PETS.   Day of Surgery: Take a shower with CHG soap. Do not wear jewelry or makeup Do not wear lotions, powders, perfumes/colognes, or deodorant. Do not shave 48 hours prior to surgery.  Men may shave face and neck. Do not bring valuables to the hospital.  [facility] is not responsible for any belongings or valuables. Do not wear nail polish, gel polish, artificial nails, or any other type of covering on natural nails (fingers and toes) If you have artificial nails or gel coating that need to be removed by a nail salon, please have this removed prior to surgery. Artificial nails or gel coating may interfere with anesthesia's ability to adequately monitor your vital signs.  Wear Clean/Comfortable clothing the morning of surgery Remember to brush your teeth WITH YOUR REGULAR TOOTHPASTE.   Please read over the following fact sheets that you were given.    If you received a COVID test during your pre-op visit  it is requested that you wear a mask when out in public, stay away from anyone that may not be feeling well and notify your surgeon if you develop symptoms. If you have been in contact with anyone that has tested positive in the last 10 days please notify you surgeon.

## 2022-05-27 NOTE — Progress Notes (Signed)
PCP - Redmond School, MD  Cardiologist -   Mallipeddi, Quenten Raven, MD   PPM/ICD - denies  Chest x-ray - N/A EKG - 05/22/22 Stress Test - 04/16/17 in care everywhere ECHO - 04/10/18- in care everywhere Cardiac Cath - denies  Sleep Study - denies  Fasting Blood Sugar - N/A   Last dose of GLP1 agonist-  N/A   Blood Thinner Instructions: Pt states she was told to stop plavix 7 days prior to surgery, but she thinks she may have taken a dose today. Voicemail left for [facility] with Dr. Adline Mango office to notify that patient may have taken plavix today.  Aspirin Instructions: N/A- pt not currently taking aspirin  ERAS Protcol -NPO order   COVID TEST- N/A  Pt given instructions for using CHG soap for 5 showers prior to surgery.   Anesthesia review: cardiac clearance in epic, pt denies any cardiac issues/symptoms. Patient is requesting no female's to touch her body. Pt states she is okay with anesthesia working with her airway, but does not want to have a female nurse or nurse tech who could be touching her body.   Patient denies shortness of breath, fever, cough and chest pain at PAT appointment   All instructions explained to the patient, with a verbal understanding of the material. Patient agrees to go over the instructions while at home for a better understanding.  The opportunity to ask questions was provided.

## 2022-05-28 DIAGNOSIS — S22000A Wedge compression fracture of unspecified thoracic vertebra, initial encounter for closed fracture: Secondary | ICD-10-CM | POA: Diagnosis not present

## 2022-05-28 DIAGNOSIS — S32000A Wedge compression fracture of unspecified lumbar vertebra, initial encounter for closed fracture: Secondary | ICD-10-CM | POA: Diagnosis not present

## 2022-05-28 DIAGNOSIS — Z681 Body mass index (BMI) 19 or less, adult: Secondary | ICD-10-CM | POA: Diagnosis not present

## 2022-05-28 DIAGNOSIS — J449 Chronic obstructive pulmonary disease, unspecified: Secondary | ICD-10-CM | POA: Diagnosis not present

## 2022-05-28 DIAGNOSIS — M5414 Radiculopathy, thoracic region: Secondary | ICD-10-CM | POA: Diagnosis not present

## 2022-05-28 DIAGNOSIS — M4856XA Collapsed vertebra, not elsewhere classified, lumbar region, initial encounter for fracture: Secondary | ICD-10-CM | POA: Diagnosis not present

## 2022-05-28 DIAGNOSIS — I10 Essential (primary) hypertension: Secondary | ICD-10-CM | POA: Diagnosis not present

## 2022-05-28 NOTE — Progress Notes (Signed)
Anesthesia Chart Review:  Case: [id number] Date/Time: 06/03/22 1145   Procedure: KYPHOPLASTY, T6, T7, T8   Anesthesia type: General   Pre-op diagnosis: CLOSED WEDGE COMPESSION FX OF T6 VERTEBR WITH DELAYED HEALING   [facility] / [facility]   Surgeons: Tressie Stalker, MD       DISCUSSION: Patient is a 82 year old female scheduled for the above procedure.  History includes former smoker (quit 12/26/93), post-operative N/V, HTN, HLD, GERD, hypothyroidism (thyroidectomy 1973), asthma, carotid artery stenosis (left carotid endarterectomy 05/19/17), glaucoma, anxiety, rhinoplasty.   She has a history of elevated coronary calcium score on 10/07/12 (1484). She had a negative stress echo with [facility]ologist Dr. Molly Maduro in 2016. Echo in 2020 showed normal LV function, EF 63%, trace. MR. Dr. Arbie Cookey follows her carotid disease. She had 1-39% bilateral ICA stenosis by [place] in March 2023. (It's not completely clear to me why she is on Plavix. Some notes suggest a combination of carotid disease and elevated coronary calcium. Per preoperative cardiology assessment below, Dr. Jenene Slicker felt Plavix could be discontinued and start ASA 81 mg after her procedure.   She had a preoperative cardiology evaluation on 05/22/22 by Dr. Jenene Slicker. He wrote, "Prior to the fracture, she used to walk with her husband every other day for 1 mile at her own pace with no symptoms of angina or DOE. On the remaining days of the week, she used to go for line dancing. Since the thoracic spine fractures, she has been in excruciating pain despite being on pain medications. She continues to deny any symptoms of angina, DOE.  Denies any dizziness, lightness, syncope, palpitations and leg swelling... Preop cardiac risk stratification for kyphoplasty -Patient had more than 4 METS prior to the spine fracture in 03/2022. She never had any angina or DOE or any other anginal equivalent. EKG showed some normal sinus rhythm and no ST  changes. Patient is at a moderate risk for any perioperative cardiac complications based on the comorbidities. No further cardiac testing is indicated prior to proceeding with the planned procedure. -Hold Plavix for 7 days prior to the procedure and start aspirin 81 mg after the procedure whenever safe from the bleeding standpoint. Discontinue Plavix after the procedure." 3 month follow-up planned.   She reported last Plavix was either 05/26/22 or 05/27/22.   Anesthesia team to evaluate on the day of surgery. Per PAT RN note, "Patient is requesting no female's to touch her body. Pt states she is okay with anesthesia working with her airway, but does not want to have a female nurse or nurse tech who could be touching her body."   VS: BP 135/75   Pulse 87   Temp 37.1 C (Oral)   Resp 18   Ht 5\' 3"  (1.6 m)   Wt 45.8 kg   SpO2 97%   BMI 17.89 kg/m    PROVIDERS: Elfredia Nevins, MD is PCP  Encarnacion Slates, MD is cardiologist Gretta Began, MD is vascular surgeon Purcell Nails, MD is endocrinologist   LABS: Labs reviewed: Acceptable for surgery. (all labs ordered are listed, but only abnormal results are displayed)  Labs Reviewed  SURGICAL PCR SCREEN - Abnormal; Notable for the following components:      Result Value   Staphylococcus aureus POSITIVE (*)    All other components within normal limits  BASIC METABOLIC PANEL  CBC     IMAGES: MRI T-spine 05/14/22 ([facility]): IMPRESSION:  1. Vertebral numbering is based on counting caudally from C2 and  is therefore accurate. This results in a different numbering system from the prior lumbar MRI scans. What was previously called a compression fracture of the T12 vertebral body is actually L1, and the patient has 6 lumbar-type vertebrae.  2. Moderate anteriorly-wedged compression fracture of the T4 vertebral body with no significant retropulsion. Minimal marrow edema.  3. Severe anteriorly-wedged compression fracture of the T6 vertebral  body with mild retropulsion of the posterior, inferior corner of the vertebral body, but only mild spinal canal stenosis and no spinal cord compression. Minimal marrow edema.  4. Moderately severe anteriorly-wedged compression fracture of the T7 vertebral body with mild retropulsion of the posterior, inferior corner of the vertebral body, but only mild spinal canal stenosis and no spinal cord compression. Marrow edema present in the vertebral body and the posterior elements, suggesting that this is a recent fracture.  5. Mild compression fracture of the T8 vertebral body with minimal retropulsion. Marrow edema present in the vertebral body and the bilateral pedicles, suggesting that this is a recent fracture.  6. Exaggeration of the normal thoracic kyphosis in the mid thoracic spine due to the compression fractures.  7. Old L1 vertebral body compression fracture (previously called T12).   Xray ribs 2V left 04/06/22 ([facility]): Impression:  Cannot exclude a nondisplaced left posterior inferior rib fracture  Osteopenia  Compression fractures of the thoracic and lumbar spine age-indeterminate    EKG: 05/22/22 ([facility]): NSR. Septal infarct, age undetermined.   CV: US Carotid 05/02/21: Summary:  - Right Carotid: Velocities in the right ICA are consistent with a 1-39%  stenosis.  - Left Carotid: Velocities in the left ICA are consistent with a 1-39%  stenosis.  - Vertebrals: Bilateral vertebral arteries demonstrate antegrade flow.  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    Echo 04/10/18 ([facility]): Summary  Structurally normal mitral valve.  Trace mitral regurgitation.  There is mild aortic sclerosis noted, with no evidence of stenosis.  No aortic stenosis. No aortic regurgitation.  Normal size left atrium.  Left atrial volume index of 24.6 ml per meters squared BSA.  Normal left ventricular systolic function  Ejection  fraction is visually estimated at 63%  normal wall motion  unable to determine diastolic function    Stress echo 05/17/14 ([facility]): Per 10/24/14 cardiology note by Dr. Abelardo Diesel, "Negative stress echo"    CT Cardiac Calcium Score 10/08/22 [facility]): Impression: 1.  Total coronary calcium score: 1484.  2.  A score greater than 400 is concerning for "significant" coronary  artery disease.  Cardiovascular consult recommended.   Past Medical History:  Diagnosis Date   Anxiety    Arthritis    Asthma    Carotid artery stenosis    40-59 % -Left   GERD (gastroesophageal reflux disease)    Glaucoma    High blood pressure    HTN (hypertension)    Hyperlipidemia    Hypothyroidism    PONV (postoperative nausea and vomiting)    also sore throat after back surgery    Past Surgical History:  Procedure Laterality Date   BREAST BIOPSY     x3 left   CATARACT EXTRACTION W/ INTRAOCULAR LENS  IMPLANT, BILATERAL     COLONOSCOPY N/A 10/05/2013   Procedure: COLONOSCOPY;  Surgeon: Dalia Heading, MD;  [facility];  Service: Gastroenterology;  Laterality: N/A;   ENDARTERECTOMY Left 05/19/2017   Procedure: ENDARTERECTOMY CAROTID LEFT;  Surgeon: Larina Earthly, MD;  Location: [facility];  Service: Vascular;  Laterality: Left;   FRACTURE SURGERY     Spinal Fx 06/30/11- pt denies   NECK MASS EXCISION     PATCH ANGIOPLASTY Left 05/19/2017   Procedure: PATCH ANGIOPLASTY LEFT CAROTID ARTERY USING HEMASHIELD PLATINUM FINESSE PATCH;  Surgeon: Larina Earthly, MD;  Location[facility];  Service: Vascular;  Laterality: Left;   RHINOPLASTY     3 total surgeries   SPINE SURGERY  06/30/2011   pt states surgery was 2002    THYROIDECTOMY     TONSILLECTOMY      MEDICATIONS:  clopidogrel (PLAVIX) 75 MG tablet   doxycycline (ADOXA) 100 MG tablet   amLODipine (NORVASC) 5 MG tablet   Ascorbic Acid (VITAMIN C) 1000 MG tablet   [START ON 06/05/2022] aspirin EC 81 MG tablet   atorvastatin (LIPITOR)  40 MG tablet   calcium carbonate (TUMS - DOSED IN MG ELEMENTAL CALCIUM) 500 MG chewable tablet   Calcium Citrate-Vitamin D (CALCIUM CITRATE + D PO)   Cholecalciferol 25 MCG (1000 UT) tablet   enalapril (VASOTEC) 20 MG tablet   folic acid (FOLVITE) 800 MCG tablet   Garlic 1000 MG CAPS   ibuprofen (ADVIL,MOTRIN) 200 MG tablet   levothyroxine (SYNTHROID) 112 MCG tablet   levothyroxine (SYNTHROID) 75 MCG tablet   LORazepam (ATIVAN) 0.5 MG tablet   magnesium hydroxide (MILK OF MAGNESIA) 400 MG/5ML suspension   metoprolol succinate (TOPROL-XL) 50 MG 24 hr tablet   montelukast (SINGULAIR) 10 MG tablet   Multiple Vitamin (MULTIVITAMIN) capsule   Omega 3 1000 MG CAPS   omeprazole (PRILOSEC) 20 MG capsule   oxyCODONE-acetaminophen (PERCOCET) 10-325 MG tablet   polyethylene glycol (MIRALAX / GLYCOLAX) 17 g packet   PROAIR HFA 108 (90 BASE) MCG/ACT inhaler   pyridOXINE (VITAMIN B-6) 100 MG tablet   vitamin B-12 (CYANOCOBALAMIN) 500 MCG tablet   vitamin E 400 UNIT capsule   zoledronic acid (RECLAST) 5 MG/100ML SOLN injection   No current facility-administered medications for this encounter.    Shonna Chock, PA-C Surgical Short Stay/Anesthesiology [facility] Phone [phone] [facility] Phone [phone] 05/29/2022 12:14 PM

## 2022-05-29 ENCOUNTER — Ambulatory Visit: Payer: Medicare Other | Admitting: Vascular Surgery

## 2022-05-29 ENCOUNTER — Encounter (HOSPITAL_COMMUNITY): Payer: Self-pay

## 2022-06-03 ENCOUNTER — Encounter (HOSPITAL_COMMUNITY): Payer: Self-pay | Admitting: Neurosurgery

## 2022-06-03 ENCOUNTER — Ambulatory Visit (HOSPITAL_BASED_OUTPATIENT_CLINIC_OR_DEPARTMENT_OTHER): Payer: Medicare Other | Admitting: Vascular Surgery

## 2022-06-03 ENCOUNTER — Ambulatory Visit (HOSPITAL_COMMUNITY): Payer: Medicare Other

## 2022-06-03 ENCOUNTER — Encounter (HOSPITAL_COMMUNITY)
Admission: RE | Disposition: A | Discharge status: Home / Self Care | Payer: Self-pay | Source: Home / Self Care | Attending: Neurosurgery

## 2022-06-03 ENCOUNTER — Other Ambulatory Visit: Payer: Self-pay

## 2022-06-03 ENCOUNTER — Ambulatory Visit (HOSPITAL_COMMUNITY)
Admission: RE | Admit: 2022-06-03 | Discharge: 2022-06-03 | Disposition: A | Discharge status: Home / Self Care | Payer: Medicare Other | Attending: Neurosurgery | Admitting: Neurosurgery

## 2022-06-03 ENCOUNTER — Ambulatory Visit (HOSPITAL_COMMUNITY): Payer: Medicare Other | Admitting: Vascular Surgery

## 2022-06-03 DIAGNOSIS — I10 Essential (primary) hypertension: Secondary | ICD-10-CM | POA: Diagnosis not present

## 2022-06-03 DIAGNOSIS — X58XXXA Exposure to other specified factors, initial encounter: Secondary | ICD-10-CM | POA: Insufficient documentation

## 2022-06-03 DIAGNOSIS — Z87891 Personal history of nicotine dependence: Secondary | ICD-10-CM | POA: Diagnosis not present

## 2022-06-03 DIAGNOSIS — K219 Gastro-esophageal reflux disease without esophagitis: Secondary | ICD-10-CM | POA: Diagnosis not present

## 2022-06-03 DIAGNOSIS — M8788 Other osteonecrosis, other site: Secondary | ICD-10-CM | POA: Diagnosis not present

## 2022-06-03 DIAGNOSIS — E890 Postprocedural hypothyroidism: Secondary | ICD-10-CM | POA: Diagnosis not present

## 2022-06-03 DIAGNOSIS — Z8249 Family history of ischemic heart disease and other diseases of the circulatory system: Secondary | ICD-10-CM | POA: Diagnosis not present

## 2022-06-03 DIAGNOSIS — Z041 Encounter for examination and observation following transport accident: Secondary | ICD-10-CM | POA: Diagnosis not present

## 2022-06-03 DIAGNOSIS — M4854XA Collapsed vertebra, not elsewhere classified, thoracic region, initial encounter for fracture: Secondary | ICD-10-CM

## 2022-06-03 DIAGNOSIS — I2510 Atherosclerotic heart disease of native coronary artery without angina pectoris: Secondary | ICD-10-CM | POA: Diagnosis not present

## 2022-06-03 DIAGNOSIS — M8008XA Age-related osteoporosis with current pathological fracture, vertebra(e), initial encounter for fracture: Secondary | ICD-10-CM | POA: Diagnosis not present

## 2022-06-03 DIAGNOSIS — E039 Hypothyroidism, unspecified: Secondary | ICD-10-CM | POA: Diagnosis not present

## 2022-06-03 HISTORY — PX: KYPHOPLASTY: SHX5884

## 2022-06-03 SURGERY — KYPHOPLASTY
Anesthesia: General

## 2022-06-03 MED ORDER — IOHEXOL 300 MG/ML  SOLN
INTRAMUSCULAR | Status: DC | PRN
  Administered 2022-06-03: 30 mL

## 2022-06-03 MED ORDER — CHLORHEXIDINE GLUCONATE CLOTH 2 % EX PADS: 6.0000 | MEDICATED_PAD | Freq: Once | CUTANEOUS | Status: DC

## 2022-06-03 MED ORDER — DEXAMETHASONE SODIUM PHOSPHATE 10 MG/ML IJ SOLN
INTRAMUSCULAR | Status: DC | PRN
  Administered 2022-06-03 (×2): 10 mg via INTRAVENOUS

## 2022-06-03 MED ORDER — IOPAMIDOL (ISOVUE-300) INJECTION 61%: INTRAVENOUS | Status: DC | PRN

## 2022-06-03 MED ORDER — LIDOCAINE 2% (20 MG/ML) 5 ML SYRINGE
INTRAMUSCULAR | Status: DC | PRN
  Administered 2022-06-03: 40 mg via INTRAVENOUS

## 2022-06-03 MED ORDER — BACITRACIN ZINC 500 UNIT/GM EX OINT
TOPICAL_OINTMENT | CUTANEOUS | Status: AC
  Filled 2022-06-03: qty 28.35

## 2022-06-03 MED ORDER — 0.9 % SODIUM CHLORIDE (POUR BTL) OPTIME
TOPICAL | Status: DC | PRN
  Administered 2022-06-03: 1000 mL

## 2022-06-03 MED ORDER — DIPHENHYDRAMINE HCL 50 MG/ML IJ SOLN
INTRAMUSCULAR | Status: DC | PRN
  Administered 2022-06-03: 50 mg via INTRAVENOUS

## 2022-06-03 MED ORDER — OXYCODONE-ACETAMINOPHEN 5-325 MG PO TABS
ORAL_TABLET | ORAL | Status: AC
  Filled 2022-06-03: qty 1

## 2022-06-03 MED ORDER — BUPIVACAINE-EPINEPHRINE (PF) 0.5% -1:200000 IJ SOLN
INTRAMUSCULAR | Status: AC
  Filled 2022-06-03: qty 30

## 2022-06-03 MED ORDER — DEXAMETHASONE SODIUM PHOSPHATE 10 MG/ML IJ SOLN
INTRAMUSCULAR | Status: AC
  Filled 2022-06-03: qty 2

## 2022-06-03 MED ORDER — DIPHENHYDRAMINE HCL 50 MG/ML IJ SOLN
INTRAMUSCULAR | Status: AC
  Filled 2022-06-03: qty 1

## 2022-06-03 MED ORDER — BUPIVACAINE-EPINEPHRINE 0.5% -1:200000 IJ SOLN
INTRAMUSCULAR | Status: DC | PRN
  Administered 2022-06-03: 15 mL

## 2022-06-03 MED ORDER — BACITRACIN ZINC 500 UNIT/GM EX OINT
TOPICAL_OINTMENT | CUTANEOUS | Status: DC | PRN
  Administered 2022-06-03: 1 via TOPICAL

## 2022-06-03 MED ORDER — CHLORHEXIDINE GLUCONATE 0.12 % MT SOLN
15.0000 mL | Freq: Once | OROMUCOSAL | Status: AC
  Administered 2022-06-03: 15 mL via OROMUCOSAL
  Filled 2022-06-03: qty 15

## 2022-06-03 MED ORDER — PHENYLEPHRINE HCL-NACL 20-0.9 MG/250ML-% IV SOLN
INTRAVENOUS | Status: DC | PRN
  Administered 2022-06-03: 40 ug/min via INTRAVENOUS

## 2022-06-03 MED ORDER — MIDAZOLAM HCL 2 MG/2ML IJ SOLN
INTRAMUSCULAR | Status: AC
  Filled 2022-06-03: qty 2

## 2022-06-03 MED ORDER — PROPOFOL 10 MG/ML IV BOLUS
INTRAVENOUS | Status: DC | PRN
  Administered 2022-06-03: 70 mg via INTRAVENOUS
  Administered 2022-06-03: 30 mg via INTRAVENOUS

## 2022-06-03 MED ORDER — FENTANYL CITRATE (PF) 100 MCG/2ML IJ SOLN
25.0000 ug | INTRAMUSCULAR | Status: DC | PRN
  Administered 2022-06-03 (×3): 25 ug via INTRAVENOUS

## 2022-06-03 MED ORDER — MIDAZOLAM HCL 2 MG/2ML IJ SOLN
INTRAMUSCULAR | Status: DC | PRN
  Administered 2022-06-03 (×2): .5 mg via INTRAVENOUS

## 2022-06-03 MED ORDER — LACTATED RINGERS IV SOLN: INTRAVENOUS | Status: DC

## 2022-06-03 MED ORDER — ONDANSETRON HCL 4 MG/2ML IJ SOLN
INTRAMUSCULAR | Status: DC | PRN
  Administered 2022-06-03: 4 mg via INTRAVENOUS

## 2022-06-03 MED ORDER — PHENYLEPHRINE 80 MCG/ML (10ML) SYRINGE FOR IV PUSH (FOR BLOOD PRESSURE SUPPORT)
PREFILLED_SYRINGE | INTRAVENOUS | Status: DC | PRN
  Administered 2022-06-03 (×2): 80 ug via INTRAVENOUS

## 2022-06-03 MED ORDER — ROCURONIUM BROMIDE 10 MG/ML (PF) SYRINGE
PREFILLED_SYRINGE | INTRAVENOUS | Status: AC
  Filled 2022-06-03: qty 10

## 2022-06-03 MED ORDER — FENTANYL CITRATE (PF) 250 MCG/5ML IJ SOLN
INTRAMUSCULAR | Status: AC
  Filled 2022-06-03: qty 5

## 2022-06-03 MED ORDER — FENTANYL CITRATE (PF) 250 MCG/5ML IJ SOLN
INTRAMUSCULAR | Status: DC | PRN
  Administered 2022-06-03: 100 ug via INTRAVENOUS
  Administered 2022-06-03: 50 ug via INTRAVENOUS

## 2022-06-03 MED ORDER — ONDANSETRON HCL 4 MG/2ML IJ SOLN
INTRAMUSCULAR | Status: AC
  Filled 2022-06-03: qty 2

## 2022-06-03 MED ORDER — VANCOMYCIN HCL IN DEXTROSE 1-5 GM/200ML-% IV SOLN
1000.0000 mg | INTRAVENOUS | Status: AC
  Administered 2022-06-03: 1000 mg via INTRAVENOUS
  Filled 2022-06-03: qty 200

## 2022-06-03 MED ORDER — FENTANYL CITRATE (PF) 100 MCG/2ML IJ SOLN
INTRAMUSCULAR | Status: AC
  Filled 2022-06-03: qty 2

## 2022-06-03 MED ORDER — ORAL CARE MOUTH RINSE: 15.0000 mL | Freq: Once | OROMUCOSAL | Status: AC

## 2022-06-03 MED ORDER — LIDOCAINE 2% (20 MG/ML) 5 ML SYRINGE
INTRAMUSCULAR | Status: AC
  Filled 2022-06-03: qty 5

## 2022-06-03 MED ORDER — AMISULPRIDE (ANTIEMETIC) 5 MG/2ML IV SOLN: 10.0000 mg | Freq: Once | INTRAVENOUS | Status: DC | PRN

## 2022-06-03 MED ORDER — ROCURONIUM BROMIDE 10 MG/ML (PF) SYRINGE
PREFILLED_SYRINGE | INTRAVENOUS | Status: DC | PRN
  Administered 2022-06-03: 40 mg via INTRAVENOUS
  Administered 2022-06-03: 10 mg via INTRAVENOUS

## 2022-06-03 MED ORDER — OXYCODONE-ACETAMINOPHEN 5-325 MG PO TABS
1.0000 | ORAL_TABLET | Freq: Once | ORAL | Status: AC
  Administered 2022-06-03: 1 via ORAL

## 2022-06-03 MED ORDER — SUGAMMADEX SODIUM 200 MG/2ML IV SOLN
INTRAVENOUS | Status: DC | PRN
  Administered 2022-06-03: 200 mg via INTRAVENOUS

## 2022-06-03 MED ORDER — PHENYLEPHRINE 80 MCG/ML (10ML) SYRINGE FOR IV PUSH (FOR BLOOD PRESSURE SUPPORT)
PREFILLED_SYRINGE | INTRAVENOUS | Status: AC
  Filled 2022-06-03: qty 10

## 2022-06-03 MED ORDER — PROPOFOL 10 MG/ML IV BOLUS
INTRAVENOUS | Status: AC
  Filled 2022-06-03: qty 20

## 2022-06-03 SURGICAL SUPPLY — 43 items
APL SKNCLS STERI-STRIP NONHPOA (GAUZE/BANDAGES/DRESSINGS) ×1
BAG COUNTER SPONGE SURGICOUNT (BAG) ×2 IMPLANT
BAG SPNG CNTER NS LX DISP (BAG) ×1
BENZOIN TINCTURE PRP APPL 2/3 (GAUZE/BANDAGES/DRESSINGS) ×2 IMPLANT
BLADE CLIPPER SURG (BLADE) IMPLANT
BLADE SURG 15 STRL LF DISP TIS (BLADE) ×2 IMPLANT
BLADE SURG 15 STRL SS (BLADE) ×1
CEMENT KYPHON C01A KIT/MIXER (Cement) IMPLANT
DEVICE BIOPSY BONE KYPHX (INSTRUMENTS) IMPLANT
DRAPE C-ARM 42X72 X-RAY (DRAPES) ×2 IMPLANT
DRAPE HALF SHEET 40X57 (DRAPES) ×2 IMPLANT
DRAPE INCISE IOBAN 66X45 STRL (DRAPES) ×2 IMPLANT
DRAPE LAPAROTOMY 100X72X124 (DRAPES) ×2 IMPLANT
DRAPE SURG 17X23 STRL (DRAPES) ×8 IMPLANT
DRAPE WARM FLUID 44X44 (DRAPES) ×2 IMPLANT
GAUZE 4X4 16PLY ~~LOC~~+RFID DBL (SPONGE) ×2 IMPLANT
GAUZE SPONGE 4X4 12PLY STRL (GAUZE/BANDAGES/DRESSINGS) ×2 IMPLANT
GLOVE BIO SURGEON STRL SZ8 (GLOVE) ×2 IMPLANT
GLOVE BIO SURGEON STRL SZ8.5 (GLOVE) ×2 IMPLANT
GLOVE BIOGEL M 7.0 STRL (GLOVE) IMPLANT
GLOVE EXAM NITRILE XL STR (GLOVE) IMPLANT
GOWN STRL REUS W/ TWL LRG LVL3 (GOWN DISPOSABLE) IMPLANT
GOWN STRL REUS W/ TWL XL LVL3 (GOWN DISPOSABLE) IMPLANT
GOWN STRL REUS W/TWL LRG LVL3 (GOWN DISPOSABLE)
GOWN STRL REUS W/TWL XL LVL3 (GOWN DISPOSABLE) ×3
INTRODUCER DEVICE OSTEO LEVEL (INTRODUCER) IMPLANT
KIT BASIN OR (CUSTOM PROCEDURE TRAY) ×2 IMPLANT
KIT POSITION SURG JACKSON T1 (MISCELLANEOUS) ×2 IMPLANT
KIT TURNOVER KIT B (KITS) ×2 IMPLANT
MARKER SKIN DUAL TIP RULER LAB (MISCELLANEOUS) IMPLANT
NEEDLE HYPO 22GX1.5 SAFETY (NEEDLE) ×2 IMPLANT
NS IRRIG 1000ML POUR BTL (IV SOLUTION) ×2 IMPLANT
PACK EENT II TURBAN DRAPE (CUSTOM PROCEDURE TRAY) ×2 IMPLANT
PAD ARMBOARD 7.5X6 YLW CONV (MISCELLANEOUS) ×6 IMPLANT
SOL ELECTROSURG ANTI STICK (MISCELLANEOUS) ×1
SOLUTION ELECTROSURG ANTI STCK (MISCELLANEOUS) ×2 IMPLANT
SPECIMEN JAR SMALL (MISCELLANEOUS) IMPLANT
STRIP CLOSURE SKIN 1/2X4 (GAUZE/BANDAGES/DRESSINGS) ×2 IMPLANT
SUT VIC AB 3-0 SH 8-18 (SUTURE) ×2 IMPLANT
SYR CONTROL 10ML LL (SYRINGE) ×2 IMPLANT
TOWEL GREEN STERILE (TOWEL DISPOSABLE) ×2 IMPLANT
TOWEL GREEN STERILE FF (TOWEL DISPOSABLE) ×2 IMPLANT
TRAY KYPHOPAK 15/3 ONESTEP 1ST (MISCELLANEOUS) IMPLANT

## 2022-06-03 NOTE — Progress Notes (Signed)
Dr Lovell Sheehan notified about pt allergy to Xray contrast -Iohexol @ 1055 , Dr Lovell Sheehan responded, "OK"

## 2022-06-03 NOTE — H&P (Signed)
Subjective: The patient is a 82 year old white female who has complained of thoracic spine pain.  She was worked up with a thoracic MRI and thoracic x-rays which demonstrated thoracic compression fractures.  I discussed the various treatment options with her.  She has decided to proceed with a kyphoplasty.  Past Medical History:  Diagnosis Date   Anxiety    Arthritis    Asthma    Carotid artery stenosis    40-59 % -Left   Elevated coronary artery calcium score 10/07/2012   Negative stress echo 2016   GERD (gastroesophageal reflux disease)    Glaucoma    High blood pressure    HTN (hypertension)    Hyperlipidemia    Hypothyroidism    PONV (postoperative nausea and vomiting)    also sore throat after back surgery    Past Surgical History:  Procedure Laterality Date   BREAST BIOPSY     x3 left   CATARACT EXTRACTION W/ INTRAOCULAR LENS  IMPLANT, BILATERAL     COLONOSCOPY N/A 10/05/2013   Procedure: COLONOSCOPY;  Surgeon: Dalia Heading, MD;  [facility];  Service: Gastroenterology;  Laterality: N/A;   ENDARTERECTOMY Left 05/19/2017   Procedure: ENDARTERECTOMY CAROTID LEFT;  Surgeon: Larina Earthly, MD;  Location: [facility] OR;  Service: Vascular;  Laterality: Left;   FRACTURE SURGERY     Spinal Fx 06/30/11- pt denies   NECK MASS EXCISION     PATCH ANGIOPLASTY Left 05/19/2017   Procedure: PATCH ANGIOPLASTY LEFT CAROTID ARTERY USING HEMASHIELD PLATINUM FINESSE PATCH;  Surgeon: Larina Earthly, MD;  Location[facility];  Service: Vascular;  Laterality: Left;   RHINOPLASTY     3 total surgeries   SPINE SURGERY  06/30/2011   pt states surgery was 2002    THYROIDECTOMY     TONSILLECTOMY      Allergies  Allergen Reactions   Ciprofloxacin Hives   Clindamycin Hcl Hives   Iodinated Contrast Media Hives    Iohexol   Sulfa Antibiotics Hives   Ceftin  [Cefuroxime] Hives   Clarithromycin    Codeine    Cymbalta [Duloxetine Hcl]    Doxycycline     Pt states that this is not an allergy    Iohexol Hives   Levofloxacin    Metoclopramide Hcl    Penicillin G    Penicillins     Social History   Tobacco Use   Smoking status: Former    Packs/day: 1.00    Years: 30.00    Additional pack years: 0.00    Total pack years: 30.00    Types: Cigarettes    Quit date: 12/26/1993    Years since quitting: 28.4   Smokeless tobacco: Never  Substance Use Topics   Alcohol use: No    Family History  Problem Relation Age of Onset   Diabetes Mother    Hypertension Mother    Hyperlipidemia Mother    Heart attack Father    Heart disease Father    Hyperlipidemia Father    Heart disease Other    Diabetes Other    Stroke Brother    Heart disease Brother    Heart attack Brother    Hyperlipidemia Brother    Stroke Maternal Grandmother    Prior to Admission medications   Medication Sig Start Date End Date Taking? Authorizing Provider  amLODipine (NORVASC) 5 MG tablet Take 5 mg by mouth daily.   Yes [provider]  Ascorbic Acid (VITAMIN C) 1000 MG tablet Take 1,000  mg by mouth at bedtime.   Yes [provider]  atorvastatin (LIPITOR) 40 MG tablet Take 40 mg by mouth every evening.   Yes [provider]  calcium carbonate (TUMS - DOSED IN MG ELEMENTAL CALCIUM) 500 MG chewable tablet Chew 1 tablet by mouth daily as needed for indigestion or heartburn.   Yes [provider]  Calcium Citrate-Vitamin D (CALCIUM CITRATE + D PO) Take 1 tablet by mouth in the morning and at bedtime.   Yes [provider]  Cholecalciferol 25 MCG (1000 UT) tablet Take 1,000 Units by mouth at bedtime.   Yes [provider]  enalapril (VASOTEC) 20 MG tablet Take 2 tablets (40 mg total) by mouth daily. 12/31/11  Yes Wall, Jesse Sans, MD  folic acid (FOLVITE) 800 MCG tablet Take 800 mcg by mouth daily.   Yes [provider]  Garlic 1000 MG CAPS Take 1,000 mg by mouth at bedtime.   Yes [provider]  ibuprofen (ADVIL,MOTRIN) 200 MG tablet Take  400 mg by mouth every 4 (four) hours as needed for pain.    Yes [provider]  levothyroxine (SYNTHROID) 112 MCG tablet Take 112 mcg by mouth every morning. 11/06/21  Yes [provider]  LORazepam (ATIVAN) 0.5 MG tablet Take 0.5 mg by mouth 3 (three) times daily as needed for sleep.  09/20/11  Yes [provider]  magnesium hydroxide (MILK OF MAGNESIA) 400 MG/5ML suspension Take 5 mLs by mouth daily.   Yes [provider]  metoprolol succinate (TOPROL-XL) 50 MG 24 hr tablet Take 50 mg by mouth daily.   Yes [provider]  montelukast (SINGULAIR) 10 MG tablet Take 10 mg by mouth daily.   Yes [provider]  Multiple Vitamin (MULTIVITAMIN) capsule Take 1 capsule by mouth daily.   Yes [provider]  Omega 3 1000 MG CAPS Take 1,000 mg by mouth at bedtime.   Yes [provider]  omeprazole (PRILOSEC) 20 MG capsule Take 20 mg by mouth 2 (two) times daily. 04/05/21  Yes [provider]  oxyCODONE-acetaminophen (PERCOCET) 10-325 MG tablet Take 0.5-1 tablets by mouth every 4 (four) hours as needed for severe pain or moderate pain.   Yes [provider]  polyethylene glycol (MIRALAX / GLYCOLAX) 17 g packet Take 17 g by mouth daily.   Yes [provider]  PROAIR HFA 108 (90 BASE) MCG/ACT inhaler Inhale 2 puffs into the lungs every 6 (six) hours as needed for wheezing or shortness of breath.  11/24/10  Yes [provider]  pyridOXINE (VITAMIN B-6) 100 MG tablet Take 100 mg by mouth daily.   Yes [provider]  vitamin B-12 (CYANOCOBALAMIN) 500 MCG tablet Take 500 mcg by mouth at bedtime.   Yes [provider]  vitamin E 400 UNIT capsule Take 400 Units by mouth at bedtime.   Yes [provider]  aspirin EC 81 MG tablet Take 1 tablet (81 mg total) by mouth daily. Swallow whole. 06/05/22   Mallipeddi, Vishnu P, MD  clopidogrel (PLAVIX) 75 MG tablet Take 75 mg by mouth daily.     [provider]  doxycycline (ADOXA) 100 MG tablet Take 100 mg by mouth 2 (two) times daily.    [provider]  levothyroxine (SYNTHROID) 75 MCG tablet Take 1 tablet (75 mcg total) by mouth daily before breakfast. Patient not taking: Reported on 05/23/2022 12/11/21   Roma Kayser, MD  zoledronic acid (RECLAST) 5 MG/100ML SOLN injection Inject  5 mg into the vein once.    [provider]     Review of Systems  Positive ROS: As above  All other systems have been reviewed and were otherwise negative with the exception of those mentioned in the HPI and as above.  Objective: Vital signs in last 24 hours: Temp:  [98.1 F (36.7 C)] 98.1 F (36.7 C) (04/08 1013) Pulse Rate:  [94] 94 (04/08 1013) Resp:  [18] 18 (04/08 1013) BP: (185-188)/(84-93) 185/84 (04/08 1025) SpO2:  [96 %] 96 % (04/08 1013) Weight:  [46.3 kg] 46.3 kg (04/08 1013) Estimated body mass index is 18.07 kg/m as calculated from the following:   Height as of this encounter: 5\' 3"  (1.6 m).   Weight as of this encounter: 46.3 kg.   General Appearance: Alert Head: Normocephalic, without obvious abnormality, atraumatic Eyes: PERRL, conjunctiva/corneas clear, EOM's intact,    Ears: Normal  Throat: Normal  Neck: Supple, Back: unremarkable Lungs: Clear to auscultation bilaterally, respirations unlabored Heart: Regular rate and rhythm, no murmur, rub or gallop Abdomen: Soft, non-tender Extremities: Extremities normal, atraumatic, no cyanosis or edema Skin: unremarkable  NEUROLOGIC:   Mental status: alert and oriented,Motor Exam - grossly normal Sensory Exam - grossly normal Reflexes:  Coordination - grossly normal Gait - grossly normal Balance - grossly normal Cranial Nerves: I: smell Not tested  II: visual acuity  OS: Normal  OD: Normal   II: visual fields Full to confrontation  II: pupils Equal, round, reactive to light  III,VII: ptosis None  III,IV,VI: extraocular muscles   Full ROM  V: mastication Normal  V: facial light touch sensation  Normal  V,VII: corneal reflex  Present  VII: facial muscle function - upper  Normal  VII: facial muscle function - lower Normal  VIII: hearing Not tested  IX: soft palate elevation  Normal  IX,X: gag reflex Present  XI: trapezius strength  5/5  XI: sternocleidomastoid strength 5/5  XI: neck flexion strength  5/5  XII: tongue strength  Normal    Data Review Lab Results  Component Value Date   WBC 5.5 05/27/2022   HGB 12.5 05/27/2022   HCT 39.4 05/27/2022   MCV 92.7 05/27/2022   PLT 287 05/27/2022   Lab Results  Component Value Date   NA 137 05/27/2022   K 4.1 05/27/2022   CL 101 05/27/2022   CO2 27 05/27/2022   BUN 14 05/27/2022   CREATININE 0.64 05/27/2022   GLUCOSE 84 05/27/2022   Lab Results  Component Value Date   INR 0.91 05/16/2017    Assessment/Plan: Thoracic compression fracture, thoracic spine pain: I have discussed situation with the patient.  I reviewed her imaging studies with her and pointed out the abnormalities.  We have discussed the various treatment options including surgery.  I have described the surgical procedure of a T6, T7 and T8 kyphoplasty.  I have shown her surgical models.  I have given her a surgical pamphlet.  We have discussed the risk, benefits, alternatives, expected postoperative course, and likelihood of achieving our goals with surgery.  I have answered all her questions.  She has decided proceed with surgery.   Cristi LoronJeffrey D Thena Devora 06/03/2022 12:22 PM

## 2022-06-03 NOTE — Anesthesia Preprocedure Evaluation (Addendum)
Anesthesia Evaluation  Patient identified by MRN, date of birth, ID band Patient awake    Reviewed: Allergy & Precautions, NPO status , Patient's Chart, lab work & pertinent test results, reviewed documented beta blocker date and time   History of Anesthesia Complications (+) PONV and history of anesthetic complications  Airway Mallampati: II  TM Distance: >3 FB Neck ROM: Full    Dental  (+) Dental Advisory Given   Pulmonary former smoker   Pulmonary exam normal breath sounds clear to auscultation       Cardiovascular hypertension, Pt. on home beta blockers and Pt. on medications + CAD and + Peripheral Vascular Disease  Normal cardiovascular exam Rhythm:Regular Rate:Normal     Neuro/Psych  PSYCHIATRIC DISORDERS Anxiety     negative neurological ROS     GI/Hepatic Neg liver ROS,GERD  Medicated and Controlled,,  Endo/Other  Hypothyroidism    Renal/GU negative Renal ROS     Musculoskeletal  (+) Arthritis ,    Abdominal   Peds  Hematology negative hematology ROS (+)   Anesthesia Other Findings   Reproductive/Obstetrics                             Anesthesia Physical Anesthesia Plan  ASA: 3  Anesthesia Plan: General   Post-op Pain Management: Tylenol PO (pre-op)*   Induction: Intravenous  PONV Risk Score and Plan: 4 or greater and Ondansetron, Dexamethasone and Treatment may vary due to age or medical condition  Airway Management Planned: Oral ETT  Additional Equipment: None  Intra-op Plan:   Post-operative Plan: Extubation in OR  Informed Consent: I have reviewed the patients History and Physical, chart, labs and discussed the procedure including the risks, benefits and alternatives for the proposed anesthesia with the patient or authorized representative who has indicated his/her understanding and acceptance.     Dental advisory given  Plan Discussed with:  CRNA  Anesthesia Plan Comments:        Anesthesia Quick Evaluation

## 2022-06-03 NOTE — Anesthesia Procedure Notes (Addendum)
Procedure Name: Intubation Date/Time: 06/03/2022 12:41 PM  Performed by: Sharyn Dross, CRNAPre-anesthesia Checklist: Patient identified, Emergency Drugs available, Suction available and Patient being monitored Patient Re-evaluated:Patient Re-evaluated prior to induction Oxygen Delivery Method: Circle system utilized Preoxygenation: Pre-oxygenation with 100% oxygen Induction Type: IV induction Ventilation: Mask ventilation without difficulty Laryngoscope Size: Mac and 3 Grade View: Grade I Tube type: Oral Tube size: 7.0 mm Number of attempts: 1 Airway Equipment and Method: Stylet and Oral airway Placement Confirmation: ETT inserted through vocal cords under direct vision, positive ETCO2 and breath sounds checked- equal and bilateral Secured at: 22 cm Tube secured with: Tape Dental Injury: Teeth and Oropharynx as per pre-operative assessment

## 2022-06-03 NOTE — Transfer of Care (Signed)
Immediate Anesthesia Transfer of Care Note  Patient: Belinda Clark  Procedure(s) Performed: KYPHOPLASTY, Thoracic Six, Thoracic Seven, Thoracic Eight  Patient Location: PACU  Anesthesia Type:General  Level of Consciousness: drowsy and patient cooperative  Airway & Oxygen Therapy: Patient connected to face mask oxygen  Post-op Assessment: Report given to RN and Post -op Vital signs reviewed and stable  Post vital signs: Reviewed and stable  Last Vitals:  Vitals Value Taken Time  BP 115/75 06/03/22 1438  Temp    Pulse 85 06/03/22 1440  Resp 17 06/03/22 1440  SpO2 97 % 06/03/22 1440  Vitals shown include unvalidated device data.  Last Pain:  Vitals:   06/03/22 1023  TempSrc:   PainSc: 6       Patients Stated Pain Goal: 0 (06/03/22 1023)  Complications: There were no known notable events for this encounter.

## 2022-06-03 NOTE — Op Note (Signed)
Brief history: The patient is a 82 year old white female who has complained of thoracic spine pain.  She was worked up with x-rays and a thoracic MRI which demonstrated T6, T7 and T8 compression fractures.  I discussed the various treatment options with her.  She has decided proceed with surgery.  Preop diagnosis: T6, T7 and T8 compression fractures, thoracic spine pain  Postop diagnosis: The same  Procedure: T6, T7 and T8 kyphoplasty, left T7 vertebral body biopsy  Surgeon: Dr. Delma Officer  Assistant: None  Anesthesia: General tracheal  Estimated blood loss: Minimal  Specimens: Left T7 vertebral body biopsy  Drains: None  Complications: None  Description of procedure: The patient was brought in the operating room by the anesthesia team.  General endotracheal anesthesia was induced.  She was turned to the prone position on the chest rolls.  Her thoracolumbar region was then prepared with Betadine scrub and Betadine solution.  Sterile drapes were applied.  I then injected the area to be incised with Marcaine with epinephrine solution.  Next a scalpel to make small incisions of the bilateral T6, T7 and T8 pedicles.  Under biplanar fluoroscopy I cannulated the bilateral T6, T7 and T8 pedicles with a Jamshidi needle.  I then obtained a left T7 vertebral body biopsy.  Under biplanar fluoroscopic guidance, I then drilled down the cannulas inserted balloons, deflated balloons, and removed the balloons.  I then filled the return by his wife bone cement under biplanar fluoroscopy.  After I was satisfied with failure I removed the cannulas.  I then reapproximated the patient's subcutaneous tissue with interrupted 3-0 Vicryl suture.  I then reapproximated the skin with Steri-Strips and benzoin.  The wound was then coated with bacitracin ointment.  A sterile dressing was applied.  The drapes were removed.  By report all sponge, instrument, and needle counts were correct at the end of this case.

## 2022-06-04 ENCOUNTER — Encounter (HOSPITAL_COMMUNITY): Payer: Self-pay | Admitting: Neurosurgery

## 2022-06-04 NOTE — Anesthesia Postprocedure Evaluation (Signed)
Anesthesia Post Note  Patient: Belinda Clark  Procedure(s) Performed: KYPHOPLASTY, Thoracic Six, Thoracic Seven, Thoracic Eight     Patient location during evaluation: PACU Anesthesia Type: General Level of consciousness: sedated and patient cooperative Pain management: pain level controlled Vital Signs Assessment: post-procedure vital signs reviewed and stable Respiratory status: spontaneous breathing Cardiovascular status: stable Anesthetic complications: no   There were no known notable events for this encounter.  Last Vitals:  Vitals:   06/03/22 1600 06/03/22 1615  BP: 109/61 116/71  Pulse: 71 77  Resp: 15 15  Temp: 36.7 C   SpO2: 92% 95%    Last Pain:  Vitals:   06/03/22 1615  TempSrc:   PainSc: 6                  Lewie Loron

## 2022-06-05 LAB — SURGICAL PATHOLOGY

## 2022-06-13 DIAGNOSIS — L853 Xerosis cutis: Secondary | ICD-10-CM | POA: Diagnosis not present

## 2022-06-13 DIAGNOSIS — Z08 Encounter for follow-up examination after completed treatment for malignant neoplasm: Secondary | ICD-10-CM | POA: Diagnosis not present

## 2022-06-13 DIAGNOSIS — L281 Prurigo nodularis: Secondary | ICD-10-CM | POA: Diagnosis not present

## 2022-06-13 DIAGNOSIS — Z85828 Personal history of other malignant neoplasm of skin: Secondary | ICD-10-CM | POA: Diagnosis not present

## 2022-06-21 DIAGNOSIS — S22060G Wedge compression fracture of T7-T8 vertebra, subsequent encounter for fracture with delayed healing: Secondary | ICD-10-CM | POA: Diagnosis not present

## 2022-06-25 ENCOUNTER — Other Ambulatory Visit (HOSPITAL_COMMUNITY): Payer: Self-pay | Admitting: Internal Medicine

## 2022-06-25 ENCOUNTER — Ambulatory Visit: Payer: Medicare Other | Admitting: Internal Medicine

## 2022-06-25 DIAGNOSIS — Z1231 Encounter for screening mammogram for malignant neoplasm of breast: Secondary | ICD-10-CM

## 2022-06-26 ENCOUNTER — Ambulatory Visit (HOSPITAL_COMMUNITY)
Admission: RE | Admit: 2022-06-26 | Discharge: 2022-06-26 | Disposition: A | Discharge status: Home / Self Care | Payer: Medicare Other | Source: Ambulatory Visit | Attending: Internal Medicine | Admitting: Internal Medicine

## 2022-06-26 ENCOUNTER — Encounter: Payer: Self-pay | Admitting: Vascular Surgery

## 2022-06-26 ENCOUNTER — Encounter (HOSPITAL_COMMUNITY): Payer: Self-pay

## 2022-06-26 ENCOUNTER — Ambulatory Visit (INDEPENDENT_AMBULATORY_CARE_PROVIDER_SITE_OTHER): Payer: Medicare Other

## 2022-06-26 ENCOUNTER — Ambulatory Visit: Payer: Medicare Other | Admitting: Vascular Surgery

## 2022-06-26 VITALS — BP 131/74 | HR 78 | Temp 96.8°F | Ht 63.0 in | Wt 99.0 lb

## 2022-06-26 DIAGNOSIS — Z1231 Encounter for screening mammogram for malignant neoplasm of breast: Secondary | ICD-10-CM | POA: Diagnosis not present

## 2022-06-26 DIAGNOSIS — I6529 Occlusion and stenosis of unspecified carotid artery: Secondary | ICD-10-CM | POA: Diagnosis not present

## 2022-06-26 NOTE — Progress Notes (Signed)
[facility]  Patient name: Belinda Clark MRN: [id number] DOB: [birth year] Sex: female  REASON FOR VISIT: Follow-up known carotid disease.  HPI: Belinda Clark is a 82 y.o. female here today for follow-up of carotid disease.  She underwent left carotid endarterectomy in March 2019 for severe asymptomatic disease.  He denies any amaurosis fugax, transient ischemic attack or stroke.  She recently underwent kyphoplasty for vertebral fractures.  She did extremely well with this.  She then suffered a left fractured rib and is uncomfortable related to this.  Past Medical History:  Diagnosis Date   Anxiety    Arthritis    Asthma    Carotid artery stenosis    40-59 % -Left   Elevated coronary artery calcium score 10/07/2012   Negative stress echo 2016   GERD (gastroesophageal reflux disease)    Glaucoma    High blood pressure    HTN (hypertension)    Hyperlipidemia    Hypothyroidism    PONV (postoperative nausea and vomiting)    also sore throat after back surgery    Family History  Problem Relation Age of Onset   Diabetes Mother    Hypertension Mother    Hyperlipidemia Mother    Heart attack Father    Heart disease Father    Hyperlipidemia Father    Heart disease Other    Diabetes Other    Stroke Brother    Heart disease Brother    Heart attack Brother    Hyperlipidemia Brother    Stroke Maternal Grandmother     SOCIAL HISTORY: Social History   Tobacco Use   Smoking status: Former    Packs/day: 1.00    Years: 30.00    Additional pack years: 0.00    Total pack years: 30.00    Types: Cigarettes    Quit date: 12/26/1993    Years since quitting: 28.5   Smokeless tobacco: Never  Substance Use Topics   Alcohol use: No    Allergies  Allergen Reactions   Ciprofloxacin Hives   Clindamycin Hcl Hives   Iodinated Contrast Media Hives    Iohexol   Sulfa Antibiotics Hives   Ceftin  [Cefuroxime] Hives    Clarithromycin    Codeine    Cymbalta [Duloxetine Hcl]    Doxycycline     Pt states that this is not an allergy   Iohexol Hives   Levofloxacin    Metoclopramide Hcl    Penicillin G    Penicillins     Current Outpatient Medications  Medication Sig Dispense Refill   amLODipine (NORVASC) 5 MG tablet Take 5 mg by mouth daily.     Ascorbic Acid (VITAMIN C) 1000 MG tablet Take 1,000 mg by mouth at bedtime.     aspirin EC 81 MG tablet Take 1 tablet (81 mg total) by mouth daily. Swallow whole. 90 tablet 3   atorvastatin (LIPITOR) 40 MG tablet Take 40 mg by mouth every evening.     calcium carbonate (TUMS - DOSED IN MG ELEMENTAL CALCIUM) 500 MG chewable tablet Chew 1 tablet by mouth daily as needed for indigestion or heartburn.     Calcium Citrate-Vitamin D (CALCIUM CITRATE + D PO) Take 1 tablet by mouth in the morning and at bedtime.     Cholecalciferol 25 MCG (1000 UT) tablet Take 1,000 Units by mouth at bedtime.     enalapril (VASOTEC) 20 MG tablet Take 2 tablets (40 mg total) by mouth daily. 180 tablet 3  folic acid (FOLVITE) 800 MCG tablet Take 800 mcg by mouth daily.     Garlic 1000 MG CAPS Take 1,000 mg by mouth at bedtime.     ibuprofen (ADVIL,MOTRIN) 200 MG tablet Take 400 mg by mouth every 4 (four) hours as needed for pain.      levothyroxine (SYNTHROID) 75 MCG tablet Take 1 tablet (75 mcg total) by mouth daily before breakfast. (Patient taking differently: Take 75 mcg by mouth daily before breakfast. 1/2 tablet) 90 tablet 3   LORazepam (ATIVAN) 0.5 MG tablet Take 0.5 mg by mouth 3 (three) times daily as needed for sleep.      magnesium hydroxide (MILK OF MAGNESIA) 400 MG/5ML suspension Take 5 mLs by mouth daily.     metoprolol succinate (TOPROL-XL) 50 MG 24 hr tablet Take 50 mg by mouth daily.     Multiple Vitamin (MULTIVITAMIN) capsule Take 1 capsule by mouth daily.     Omega 3 1000 MG CAPS Take 1,000 mg by mouth at bedtime.     Oxycodone HCl 10 MG TABS Take 10 mg by mouth 4  (four) times daily as needed.     polyethylene glycol (MIRALAX / GLYCOLAX) 17 g packet Take 17 g by mouth daily.     PROAIR HFA 108 (90 BASE) MCG/ACT inhaler Inhale 2 puffs into the lungs every 6 (six) hours as needed for wheezing or shortness of breath.      pyridOXINE (VITAMIN B-6) 100 MG tablet Take 100 mg by mouth daily.     vitamin B-12 (CYANOCOBALAMIN) 500 MCG tablet Take 500 mcg by mouth at bedtime.     vitamin E 400 UNIT capsule Take 400 Units by mouth at bedtime.     zoledronic acid (RECLAST) 5 MG/100ML SOLN injection Inject 5 mg into the vein once.     No current facility-administered medications for this visit.    REVIEW OF SYSTEMS:  [X]  denotes positive finding, [ ]  denotes negative finding Cardiac  Comments:  Chest pain or chest pressure:    Shortness of breath upon exertion:    Short of breath when lying flat:    Irregular heart rhythm:        Vascular    Pain in calf, thigh, or hip brought on by ambulation:    Pain in feet at night that wakes you up from your sleep:     Blood clot in your veins:    Leg swelling:           PHYSICAL EXAM: Vitals:   06/26/22 0945  BP: 131/74  Pulse: 78  Temp: (!) 96.8 F (36 C)  SpO2: 93%  Weight: 99 lb (44.9 kg)  Height: 5\' 3"  (1.6 m)    GENERAL: The patient is a well-nourished female, in no acute distress. The vital signs are documented above. CARDIOVASCULAR: Well-healed left carotid incision with no carotid bruits.  2+ radial pulses bilaterally PULMONARY: There is good air exchange  MUSCULOSKELETAL: There are no major deformities or cyanosis. NEUROLOGIC: No focal weakness or paresthesias are detected. SKIN: There are no ulcers or rashes noted. PSYCHIATRIC: The patient has a normal affect.  DATA:  The patent left endarterectomy with no restenosis.  On the right, she does have extensive calcification but no elevated velocities with estimated 1 to 39% stenosis  MEDICAL ISSUES: Stable carotid disease.  She she knows to  present immediately should she develop any neurologic deficits to the emergency department.  Otherwise she will be seen in 1 year with repeat carotid  duplex    Larina Earthly, MD FACS [facility] [phone]  Note: Portions of this report may have been transcribed using voice recognition software.  Every effort has been made to ensure accuracy; however, inadvertent computerized transcription errors may still be present.

## 2022-06-27 ENCOUNTER — Ambulatory Visit
Admission: EM | Admit: 2022-06-27 | Discharge: 2022-06-27 | Disposition: A | Discharge status: Home / Self Care | Payer: Medicare Other | Attending: Nurse Practitioner | Admitting: Nurse Practitioner

## 2022-06-27 ENCOUNTER — Encounter: Payer: Self-pay | Admitting: Emergency Medicine

## 2022-06-27 ENCOUNTER — Telehealth: Payer: Self-pay | Admitting: Emergency Medicine

## 2022-06-27 ENCOUNTER — Ambulatory Visit (INDEPENDENT_AMBULATORY_CARE_PROVIDER_SITE_OTHER): Payer: Medicare Other

## 2022-06-27 DIAGNOSIS — R918 Other nonspecific abnormal finding of lung field: Secondary | ICD-10-CM | POA: Diagnosis not present

## 2022-06-27 DIAGNOSIS — S2232XA Fracture of one rib, left side, initial encounter for closed fracture: Secondary | ICD-10-CM

## 2022-06-27 DIAGNOSIS — J90 Pleural effusion, not elsewhere classified: Secondary | ICD-10-CM | POA: Diagnosis not present

## 2022-06-27 MED ORDER — DOXYCYCLINE HYCLATE 100 MG PO TABS: 100.0000 mg | ORAL_TABLET | Freq: Two times a day (BID) | ORAL | 0 refills | Status: AC

## 2022-06-27 MED ORDER — DOXYCYCLINE HYCLATE 100 MG PO TABS: 100.0000 mg | ORAL_TABLET | Freq: Two times a day (BID) | ORAL | 0 refills | Status: DC

## 2022-06-27 NOTE — Discharge Instructions (Signed)
X-ray shows a fracture of the left rib cage at the ninth rib.  X-ray also shows opacities that could possibly represent infection.  Your x-ray so shows fluid in the right lung.  I am going to treat you for possible pneumonia.  Take medication as prescribed. Increase fluids and allow for plenty of rest. May take over-the-counter Tylenol as needed for pain, fever, general discomfort. Recommend using a pillow to splint when you are coughing and deep breathing.  It is important for you to continue to cough and deep breathing at this time. If you develop shortness of breath, difficulty breathing, or other concerns, please go to the emergency department immediately for further evaluation. I would like for you to follow-up with your primary care physician within the next 5 to 7 days for reevaluation, and for repeat chest x-ray. Follow-up as needed.

## 2022-06-27 NOTE — ED Triage Notes (Signed)
After trying to break dogs apart, she thinks she fractured a rib on her left side on Monday.  Pain with movement and taking a deep breath.

## 2022-06-27 NOTE — Telephone Encounter (Signed)
Patient requested that doxycyline be sent to [facility] instead of [facility] so she can get her medication tonight.

## 2022-06-27 NOTE — ED Provider Notes (Signed)
[facility]    CSN: [id number] Arrival date & time: 06/27/22  1714      History   Chief Complaint No chief complaint on file.   HPI Belinda Clark is a 82 y.o. female.   The history is provided by the patient and the spouse.   The patient presents for complaints of left-sided rib pain that is been present for the past 3 days.  Patient states that she was trying to break her dogs apart, when her husband states that his knee hit the patient's left side.  Since that time, she has had pain with moving, coughing, and taking deep breaths.  Patient denies fever, chills, difficulty breathing, trouble breathing, swelling to the left rib cage, or bruising.  She states she has been using ice to her left side, she also states that she takes oxycodone for her back pain, which has been helping her symptoms.  Patient reports she does have a history of osteoporosis.  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   Iatrogenic hyperthyroidism 04/06/2021   Primary hypertension 03/15/2021   Status post left carotid endarterectomy 03/15/2021   Degenerative scoliosis in adult patient 03/31/2019   Body mass index (BMI) 19.9 or less, adult 03/31/2019   Low back pain 03/31/2019   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   Lichen planus 03/29/2019   Compression fracture of lumbar spine, non-traumatic (HCC) 06/23/2018   Hypothyroidism 02/23/2018   Senile osteoporosis 07/07/2017   Carotid  artery stenosis 05/19/2017   Lumbar herniated disc 03/12/2017   SOB (shortness of breath) 05/02/2014   Fullness of breast 02/14/2014   Hyperlipidemia 11/04/2012   Coronary artery disease involving native coronary artery 01/04/2011   PVD (peripheral vascular disease) (HCC) 01/04/2011   ANKLE SPRAIN 04/30/2007    Past Surgical History:  Procedure Laterality Date   BREAST BIOPSY     x3 left   CATARACT EXTRACTION W/ INTRAOCULAR LENS  IMPLANT, BILATERAL     COLONOSCOPY N/A 10/05/2013   Procedure: COLONOSCOPY;  Surgeon: Dalia Heading, MD;  Location: [facility];  Service: Gastroenterology;  Laterality: N/A;   ENDARTERECTOMY Left 05/19/2017   Procedure: ENDARTERECTOMY CAROTID LEFT;  Surgeon: Larina Earthly, MD;  Location: [facility] OR;  Service: Vascular;  Laterality: Left;   FRACTURE SURGERY     Spinal Fx 06/30/11- pt denies   KYPHOPLASTY N/A 06/03/2022   Procedure: KYPHOPLASTY, Thoracic Six, Thoracic Seven, Thoracic Eight;  Surgeon: Tressie Stalker, MD;  Location: [facility] OR;  Service: Neurosurgery;  Laterality: N/A;   NECK MASS EXCISION     PATCH ANGIOPLASTY Left 05/19/2017   Procedure: PATCH ANGIOPLASTY LEFT CAROTID ARTERY USING HEMASHIELD PLATINUM FINESSE PATCH;  Surgeon: Larina Earthly, MD;  Location: [facility] OR;  Service: Vascular;  Laterality: Left;   RHINOPLASTY     3 total surgeries   SPINE SURGERY  06/30/2011   pt states surgery was 2002  THYROIDECTOMY     TONSILLECTOMY      OB History   No obstetric history on file.      Home Medications    Prior to Admission medications   Medication Sig Start Date End Date Taking? Authorizing Provider  amLODipine (NORVASC) 5 MG tablet Take 5 mg by mouth daily.    [provider]  Ascorbic Acid (VITAMIN C) 1000 MG tablet Take 1,000 mg by mouth at bedtime.    [provider]  aspirin EC 81 MG tablet Take 1 tablet (81 mg total) by mouth daily. Swallow whole. 06/05/22   Mallipeddi, Vishnu P, MD  atorvastatin (LIPITOR) 40 MG tablet  Take 40 mg by mouth every evening.    [provider]  calcium carbonate (TUMS - DOSED IN MG ELEMENTAL CALCIUM) 500 MG chewable tablet Chew 1 tablet by mouth daily as needed for indigestion or heartburn.    [provider]  Calcium Citrate-Vitamin D (CALCIUM CITRATE + D PO) Take 1 tablet by mouth in the morning and at bedtime.    [provider]  Cholecalciferol 25 MCG (1000 UT) tablet Take 1,000 Units by mouth at bedtime.    [provider]  enalapril (VASOTEC) 20 MG tablet Take 2 tablets (40 mg total) by mouth daily. 12/31/11   Wall, Jesse Sans, MD  folic acid (FOLVITE) 800 MCG tablet Take 800 mcg by mouth daily.    [provider]  Garlic 1000 MG CAPS Take 1,000 mg by mouth at bedtime.    [provider]  ibuprofen (ADVIL,MOTRIN) 200 MG tablet Take 400 mg by mouth every 4 (four) hours as needed for pain.     [provider]  levothyroxine (SYNTHROID) 75 MCG tablet Take 1 tablet (75 mcg total) by mouth daily before breakfast. Patient taking differently: Take 75 mcg by mouth daily before breakfast. 1/2 tablet 12/11/21   Nida, Denman George, MD  LORazepam (ATIVAN) 0.5 MG tablet Take 0.5 mg by mouth 3 (three) times daily as needed for sleep.  09/20/11   [provider]  magnesium hydroxide (MILK OF MAGNESIA) 400 MG/5ML suspension Take 5 mLs by mouth daily.    [provider]  metoprolol succinate (TOPROL-XL) 50 MG 24 hr tablet Take 50 mg by mouth daily.    [provider]  Multiple Vitamin (MULTIVITAMIN) capsule Take 1 capsule by mouth daily.    [provider]  Omega 3 1000 MG CAPS Take 1,000 mg by mouth at bedtime.    [provider]  Oxycodone HCl 10 MG TABS Take 10 mg by mouth 4 (four) times daily as needed. 05/31/22   [provider]  polyethylene glycol (MIRALAX / GLYCOLAX) 17 g packet Take 17 g by mouth daily.    [provider]  PROAIR HFA 108 (90 BASE) MCG/ACT inhaler  Inhale 2 puffs into the lungs every 6 (six) hours as needed for wheezing or shortness of breath.  11/24/10   [provider]  pyridOXINE (VITAMIN B-6) 100 MG tablet Take 100 mg by mouth daily.    [provider]  vitamin B-12 (CYANOCOBALAMIN) 500 MCG tablet Take 500 mcg by mouth at bedtime.    [provider]  vitamin E 400 UNIT capsule Take 400 Units by mouth at bedtime.    [provider]  zoledronic acid (RECLAST) 5 MG/100ML SOLN injection Inject 5 mg into the vein once.    [provider]    Family History Family History  Problem Relation Age  of Onset   Diabetes Mother    Hypertension Mother    Hyperlipidemia Mother    Heart attack Father    Heart disease Father    Hyperlipidemia Father    Heart disease Other    Diabetes Other    Stroke Brother    Heart disease Brother    Heart attack Brother    Hyperlipidemia Brother    Stroke Maternal Grandmother     Social History Social History   Tobacco Use   Smoking status: Former    Packs/day: 1.00    Years: 30.00    Additional pack years: 0.00    Total pack years: 30.00    Types: Cigarettes    Quit date: 12/26/1993    Years since quitting: 28.5   Smokeless tobacco: Never  Vaping Use   Vaping Use: Never used  Substance Use Topics   Alcohol use: No   Drug use: No     Allergies   Ciprofloxacin, Clindamycin hcl, Iodinated contrast media, Sulfa antibiotics, Ceftin  [cefuroxime], Clarithromycin, Codeine, Cymbalta [duloxetine hcl], Doxycycline, Iohexol, Levofloxacin, Metoclopramide hcl, Penicillin g, and Penicillins   Review of Systems Review of Systems Per HPI  Physical Exam Triage Vital Signs ED Triage Vitals  Enc Vitals Group     BP 06/27/22 1748 (!) 173/93     Pulse Rate 06/27/22 1748 86     Resp 06/27/22 1748 20     Temp 06/27/22 1748 98.8 F (37.1 C)     Temp Source 06/27/22 1748 Oral     SpO2 06/27/22 1748 91 %     Weight --      Height --      Head Circumference  --      Peak Flow --      Pain Score 06/27/22 1750 4     Pain Loc --      Pain Edu? --      Excl. in GC? --    No data found.  Updated Vital Signs BP (!) 173/93 (BP Location: Right Arm)   Pulse 86   Temp 98.8 F (37.1 C) (Oral)   Resp 20   SpO2 91%   Visual Acuity Right Eye Distance:   Left Eye Distance:   Bilateral Distance:    Right Eye Near:   Left Eye Near:    Bilateral Near:     Physical Exam Vitals and nursing note reviewed.  Constitutional:      General: She is not in acute distress.    Appearance: Normal appearance.  HENT:     Head: Normocephalic.  Eyes:     Extraocular Movements: Extraocular movements intact.     Pupils: Pupils are equal, round, and reactive to light.  Cardiovascular:     Rate and Rhythm: Normal rate and regular rhythm.     Pulses: Normal pulses.     Heart sounds: Normal heart sounds.  Pulmonary:     Effort: Pulmonary effort is normal. No respiratory distress.     Breath sounds: Normal breath sounds. No stridor. No wheezing, rhonchi or rales.  Chest:     Chest wall: Tenderness (anterior left chest between ribs 7-10) present.  Abdominal:     General: Bowel sounds are normal.     Palpations: Abdomen is soft.  Musculoskeletal:     Cervical back: Normal range of motion.  Lymphadenopathy:     Cervical: No cervical adenopathy.  Skin:    General: Skin is warm and dry.  Neurological:     General:  No focal deficit present.     Mental Status: She is alert and oriented to person, place, and time.  Psychiatric:        Mood and Affect: Mood normal.        Behavior: Behavior normal.      UC Treatments / Results  Labs (all labs ordered are listed, but only abnormal results are displayed) Labs Reviewed - No data to display  EKG   Radiology DG Ribs Unilateral W/Chest Left  Result Date: 06/27/2022 CLINICAL DATA:  Pain with inspiration. EXAM: LEFT RIBS AND CHEST - 3 VIEW COMPARISON:  CXR 07/24/20 FINDINGS: Trace right-sided pleural  effusion. There are hazy opacities at the bilateral bases which could represent atelectasis or infection. Unchanged cardiac and mediastinal contours. There is a mildly displaced fracture of the lateral left ninth rib. Visualized upper abdomen is unremarkable. IMPRESSION: 1. Mildly displaced fracture of the lateral left ninth rib. No pneumothorax. 2. Trace right-sided pleural effusion. 3. Hazy opacities at the bilateral bases which could represent atelectasis or infection. Electronically Signed   By: Lorenza Cambridge M.D.   On: 06/27/2022 18:05   MM 3D SCREENING MAMMOGRAM BILATERAL BREAST  Result Date: 06/27/2022 CLINICAL DATA:  Screening. EXAM: DIGITAL SCREENING BILATERAL MAMMOGRAM WITH TOMOSYNTHESIS AND CAD TECHNIQUE: Bilateral screening digital craniocaudal and mediolateral oblique mammograms were obtained. Bilateral screening digital breast tomosynthesis was performed. The images were evaluated with computer-aided detection. COMPARISON:  Previous exam(s). ACR Breast Density Category c: The breasts are heterogeneously dense, which may obscure small masses. FINDINGS: There are no findings suspicious for malignancy. IMPRESSION: No mammographic evidence of malignancy. A result letter of this screening mammogram will be mailed directly to the patient. RECOMMENDATION: Screening mammogram in one year. (Code:SM-B-01Y) BI-RADS CATEGORY  1: Negative. Electronically Signed   By: Frederico Hamman M.D.   On: 06/27/2022 13:59   VAS US CAROTID  Result Date: 06/26/2022 Carotid Arterial Duplex Study Patient Name:  Belinda Clark  Date of Exam:   06/26/2022 Medical Rec #: [id number]         Accession #:    [id number] Date of Birth: 16-Feb-1941          Patient Gender: F Patient Age:   8 years Exam Location:  [facility] Procedure:      VAS US CAROTID Referring Phys: TODD EARLY --------------------------------------------------------------------------------  Indications:       Carotid artery disease and Left CEA  05/19/17. Risk Factors:      Hypertension, hyperlipidemia, coronary artery disease, prior                    CVA. Comparison Study:  05/02/21: Bilateral 1-39% ICA stenosis. Right subclavian                    stenotic, left turbulent Performing Technologist: Thereasa Parkin RVT  Examination Guidelines: A complete evaluation includes B-mode imaging, spectral Doppler, color Doppler, and power Doppler as needed of all accessible portions of each vessel. Bilateral testing is considered an integral part of a complete examination. Limited examinations for reoccurring indications may be performed as noted.  Right Carotid Findings: +----------+--------+--------+--------+------------------+--------+           PSV cm/sEDV cm/sStenosisPlaque DescriptionComments +----------+--------+--------+--------+------------------+--------+ CCA Prox  136     12                                         +----------+--------+--------+--------+------------------+--------+  CCA Mid   153     20                                         +----------+--------+--------+--------+------------------+--------+ CCA Distal106     19              heterogenous               +----------+--------+--------+--------+------------------+--------+ ICA Prox  100     16      1-39%   calcific                   +----------+--------+--------+--------+------------------+--------+ ICA Mid   130     28                                         +----------+--------+--------+--------+------------------+--------+ ICA Distal125     25                                         +----------+--------+--------+--------+------------------+--------+ ECA       270     0       >50%    heterogenous               +----------+--------+--------+--------+------------------+--------+ +----------+--------+-------+--------+-------------------+           PSV cm/sEDV cmsDescribeArm Pressure (mmHG)  +----------+--------+-------+--------+-------------------+ [id number]            Stenotic131                 +----------+--------+-------+--------+-------------------+ +---------+--------+--+--------+--+---------+ VertebralPSV cm/s70EDV cm/s17Antegrade +---------+--------+--+--------+--+---------+  Left Carotid Findings: +----------+--------+--------+--------+------------------+--------+           PSV cm/sEDV cm/sStenosisPlaque DescriptionComments +----------+--------+--------+--------+------------------+--------+ CCA Prox  133     16                                         +----------+--------+--------+--------+------------------+--------+ CCA Mid   139     20                                         +----------+--------+--------+--------+------------------+--------+ CCA Distal96      11                                         +----------+--------+--------+--------+------------------+--------+ ICA Prox  101     17      1-39%                              +----------+--------+--------+--------+------------------+--------+ ICA Mid   90      22                                         +----------+--------+--------+--------+------------------+--------+ ICA Distal122     23                                         +----------+--------+--------+--------+------------------+--------+  ECA       116     0                                          +----------+--------+--------+--------+------------------+--------+ +----------+--------+--------+--------+-------------------+           PSV cm/sEDV cm/sDescribeArm Pressure (mmHG) +----------+--------+--------+--------+-------------------+ [id number]     0       Stenotic                    +----------+--------+--------+--------+-------------------+ +---------+--------+--+--------+---------------------+ VertebralPSV cm/s71EDV cm/sSystolic deceleration  +---------+--------+--+--------+---------------------+ Left BP not taken due to injured ribs.  Summary: Right Carotid: Velocities in the right ICA are consistent with a 1-39% stenosis.                The ECA appears >50% stenosed. Left Carotid: Velocities in the left ICA are consistent with a 1-39% stenosis. Vertebrals:  Right vertebral artery demonstrates antegrade flow. Left vertebral              artery demonstrates early systolic deceleration. Subclavians: Bilateral subclavian arteries were stenotic. *See table(s) above for measurements and observations.  Electronically signed by Lemar Livings MD on 06/26/2022 at 12:02:56 PM.    Final     Procedures Procedures (including critical care time)  Medications Ordered in UC Medications - No data to display  Initial Impression / Assessment and Plan / UC Course  I have reviewed the triage vital signs and the nursing notes.  Pertinent labs & imaging results that were available during my care of the patient were reviewed by me and considered in my medical decision making (see chart for details).  The patient is well-appearing, she is in no acute distress, vital signs are stable.  X-ray of the left rib shows a fracture to the ninth rib.  X-ray also shows a right-sided pleural effusion and atelectasis of the bilateral lung bases that could represent infection.  Will start patient on doxycycline 100 mg twice daily for the next 7 days to cover for pneumonia.  Patient has multiple allergies to other antibiotics used to treat pneumonia.  Will have patient follow-up with her primary care physician for continued monitoring and for resolution of possible pneumonia.  Will also have the patient continue to follow-up with her primary care physician for the left rib fracture to ensure it is healing appropriately.  Patient was given supportive care recommendations to include continuing her regular pain management regimen, ice to the left rib cage, increasing fluids, and  allowing for plenty of rest.  Patient was given strict ER follow-up precautions.  Patient was advised to follow-up with her PCP within the next 5 to 7 days for reevaluation.  Patient is in agreement with this plan of care and verbalizes understanding.  All questions were answered.  Patient stable for discharge.   Final Clinical Impressions(s) / UC Diagnoses   Final diagnoses:  Closed fracture of one rib of left side, initial encounter  Opacities of both lungs present on chest x-ray  Pleural effusion on right     Discharge Instructions      X-ray shows a fracture of the left rib cage at the ninth rib.  X-ray also shows opacities that could possibly represent infection.  Your x-ray so shows fluid in the right lung.  I am going to treat you for possible pneumonia.  Take medication as prescribed. Increase fluids and allow for  plenty of rest. May take over-the-counter Tylenol as needed for pain, fever, general discomfort. Recommend using a pillow to splint when you are coughing and deep breathing.  It is important for you to continue to cough and deep breathing at this time. If you develop shortness of breath, difficulty breathing, or other concerns, please go to the emergency department immediately for further evaluation. I would like for you to follow-up with your primary care physician within the next 5 to 7 days for reevaluation, and for repeat chest x-ray. Follow-up as needed.     ED Prescriptions   None    PDMP not reviewed this encounter.   Abran Cantor, NP 06/27/22 1844

## 2022-06-28 DIAGNOSIS — Z681 Body mass index (BMI) 19 or less, adult: Secondary | ICD-10-CM | POA: Diagnosis not present

## 2022-06-28 DIAGNOSIS — S2232XA Fracture of one rib, left side, initial encounter for closed fracture: Secondary | ICD-10-CM | POA: Diagnosis not present

## 2022-06-28 DIAGNOSIS — M4856XA Collapsed vertebra, not elsewhere classified, lumbar region, initial encounter for fracture: Secondary | ICD-10-CM | POA: Diagnosis not present

## 2022-06-28 DIAGNOSIS — S32000A Wedge compression fracture of unspecified lumbar vertebra, initial encounter for closed fracture: Secondary | ICD-10-CM | POA: Diagnosis not present

## 2022-06-28 DIAGNOSIS — I10 Essential (primary) hypertension: Secondary | ICD-10-CM | POA: Diagnosis not present

## 2022-06-28 DIAGNOSIS — J449 Chronic obstructive pulmonary disease, unspecified: Secondary | ICD-10-CM | POA: Diagnosis not present

## 2022-06-28 DIAGNOSIS — M5414 Radiculopathy, thoracic region: Secondary | ICD-10-CM | POA: Diagnosis not present

## 2022-06-28 DIAGNOSIS — S22000A Wedge compression fracture of unspecified thoracic vertebra, initial encounter for closed fracture: Secondary | ICD-10-CM | POA: Diagnosis not present

## 2022-06-28 DIAGNOSIS — M5136 Other intervertebral disc degeneration, lumbar region: Secondary | ICD-10-CM | POA: Diagnosis not present

## 2022-07-03 DIAGNOSIS — M8000XA Age-related osteoporosis with current pathological fracture, unspecified site, initial encounter for fracture: Secondary | ICD-10-CM | POA: Diagnosis not present

## 2022-07-03 DIAGNOSIS — G894 Chronic pain syndrome: Secondary | ICD-10-CM | POA: Diagnosis not present

## 2022-07-16 DIAGNOSIS — E0500 Thyrotoxicosis with diffuse goiter without thyrotoxic crisis or storm: Secondary | ICD-10-CM | POA: Diagnosis not present

## 2022-07-16 DIAGNOSIS — E890 Postprocedural hypothyroidism: Secondary | ICD-10-CM | POA: Diagnosis not present

## 2022-07-16 DIAGNOSIS — M810 Age-related osteoporosis without current pathological fracture: Secondary | ICD-10-CM | POA: Diagnosis not present

## 2022-07-25 DIAGNOSIS — G894 Chronic pain syndrome: Secondary | ICD-10-CM | POA: Diagnosis not present

## 2022-07-25 DIAGNOSIS — S22000A Wedge compression fracture of unspecified thoracic vertebra, initial encounter for closed fracture: Secondary | ICD-10-CM | POA: Diagnosis not present

## 2022-07-25 DIAGNOSIS — M8000XA Age-related osteoporosis with current pathological fracture, unspecified site, initial encounter for fracture: Secondary | ICD-10-CM | POA: Diagnosis not present

## 2022-07-25 DIAGNOSIS — M4856XA Collapsed vertebra, not elsewhere classified, lumbar region, initial encounter for fracture: Secondary | ICD-10-CM | POA: Diagnosis not present

## 2022-07-25 DIAGNOSIS — I10 Essential (primary) hypertension: Secondary | ICD-10-CM | POA: Diagnosis not present

## 2022-07-25 DIAGNOSIS — Z681 Body mass index (BMI) 19 or less, adult: Secondary | ICD-10-CM | POA: Diagnosis not present

## 2022-07-25 DIAGNOSIS — J449 Chronic obstructive pulmonary disease, unspecified: Secondary | ICD-10-CM | POA: Diagnosis not present

## 2022-08-02 DIAGNOSIS — I10 Essential (primary) hypertension: Secondary | ICD-10-CM | POA: Diagnosis not present

## 2022-08-02 DIAGNOSIS — F5101 Primary insomnia: Secondary | ICD-10-CM | POA: Diagnosis not present

## 2022-08-06 ENCOUNTER — Ambulatory Visit: Payer: Medicare Other | Admitting: General Surgery

## 2022-08-15 ENCOUNTER — Ambulatory Visit: Payer: Medicare Other | Admitting: Internal Medicine

## 2022-08-17 ENCOUNTER — Other Ambulatory Visit: Payer: Self-pay | Admitting: "Endocrinology

## 2022-08-22 DIAGNOSIS — F5101 Primary insomnia: Secondary | ICD-10-CM | POA: Diagnosis not present

## 2022-08-22 DIAGNOSIS — M8000XA Age-related osteoporosis with current pathological fracture, unspecified site, initial encounter for fracture: Secondary | ICD-10-CM | POA: Diagnosis not present

## 2022-08-22 DIAGNOSIS — Z681 Body mass index (BMI) 19 or less, adult: Secondary | ICD-10-CM | POA: Diagnosis not present

## 2022-08-22 DIAGNOSIS — R490 Dysphonia: Secondary | ICD-10-CM | POA: Diagnosis not present

## 2022-08-22 DIAGNOSIS — E063 Autoimmune thyroiditis: Secondary | ICD-10-CM | POA: Diagnosis not present

## 2022-08-22 DIAGNOSIS — M4856XA Collapsed vertebra, not elsewhere classified, lumbar region, initial encounter for fracture: Secondary | ICD-10-CM | POA: Diagnosis not present

## 2022-08-22 DIAGNOSIS — I10 Essential (primary) hypertension: Secondary | ICD-10-CM | POA: Diagnosis not present

## 2022-08-23 DIAGNOSIS — E0500 Thyrotoxicosis with diffuse goiter without thyrotoxic crisis or storm: Secondary | ICD-10-CM | POA: Diagnosis not present

## 2022-08-23 DIAGNOSIS — E890 Postprocedural hypothyroidism: Secondary | ICD-10-CM | POA: Diagnosis not present

## 2022-08-23 DIAGNOSIS — M810 Age-related osteoporosis without current pathological fracture: Secondary | ICD-10-CM | POA: Diagnosis not present

## 2022-09-03 ENCOUNTER — Encounter: Payer: Self-pay | Admitting: General Surgery

## 2022-09-03 ENCOUNTER — Encounter: Payer: Medicare Other | Admitting: Internal Medicine

## 2022-09-03 ENCOUNTER — Ambulatory Visit: Payer: Medicare Other | Admitting: General Surgery

## 2022-09-03 DIAGNOSIS — K4090 Unilateral inguinal hernia, without obstruction or gangrene, not specified as recurrent: Secondary | ICD-10-CM | POA: Diagnosis not present

## 2022-09-03 NOTE — Progress Notes (Signed)
Erroneous encounter - please disregard.

## 2022-09-04 NOTE — Progress Notes (Signed)
Belinda Clark; [id number]; [birth year]   HPI Patient is an 82 year old white female who returns back to my care for evaluation and treatment of a right inguinal hernia.  She states that she has not had any episodes of incarceration.  The hernia is made worse with straining but does flatten out when she lies down.  She recently had thoracic spine surgery and also had an accident where she suffered a left rib fracture in June of this year.  She denies any nausea or vomiting.  She just wanted me to reevaluate the hernia. Past Medical History:  Diagnosis Date   Anxiety    Arthritis    Asthma    Carotid artery stenosis    40-59 % -Left   Elevated coronary artery calcium score 10/07/2012   Negative stress echo 2016   GERD (gastroesophageal reflux disease)    Glaucoma    High blood pressure    HTN (hypertension)    Hyperlipidemia    Hypothyroidism    PONV (postoperative nausea and vomiting)    also sore throat after back surgery    Past Surgical History:  Procedure Laterality Date   BREAST BIOPSY     x3 left   CATARACT EXTRACTION W/ INTRAOCULAR LENS  IMPLANT, BILATERAL     COLONOSCOPY N/A 10/05/2013   Procedure: COLONOSCOPY;  Surgeon: Dalia Heading, MD;  [facility];  Service: Gastroenterology;  Laterality: N/A;   ENDARTERECTOMY Left 05/19/2017   Procedure: ENDARTERECTOMY CAROTID LEFT;  Surgeon: Larina Earthly, MD;  Location: [facility] OR;  Service: Vascular;  Laterality: Left;   FRACTURE SURGERY     Spinal Fx 06/30/11- pt denies   KYPHOPLASTY N/A 06/03/2022   Procedure: KYPHOPLASTY, Thoracic Six, Thoracic Seven, Thoracic Eight;  Surgeon: Tressie Stalker, MD;  Location: [facility] OR;  Service: Neurosurgery;  Laterality: N/A;   NECK MASS EXCISION     PATCH ANGIOPLASTY Left 05/19/2017   Procedure: PATCH ANGIOPLASTY LEFT CAROTID ARTERY USING HEMASHIELD PLATINUM FINESSE PATCH;  Surgeon: Larina Earthly, MD;  Location: [facility] OR;  Service: Vascular;  Laterality: Left;   RHINOPLASTY     3 total  surgeries   SPINE SURGERY  06/30/2011   pt states surgery was 2002    THYROIDECTOMY     TONSILLECTOMY      Family History  Problem Relation Age of Onset   Diabetes Mother    Hypertension Mother    Hyperlipidemia Mother    Heart attack Father    Heart disease Father    Hyperlipidemia Father    Heart disease Other    Diabetes Other    Stroke Brother    Heart disease Brother    Heart attack Brother    Hyperlipidemia Brother    Stroke Maternal Grandmother     Current Outpatient Medications on File Prior to Visit  Medication Sig Dispense Refill   amLODipine (NORVASC) 5 MG tablet Take 5 mg by mouth daily.     Ascorbic Acid (VITAMIN C) 1000 MG tablet Take 1,000 mg by mouth at bedtime.     aspirin EC 81 MG tablet Take 1 tablet (81 mg total) by mouth daily. Swallow whole. 90 tablet 3   atorvastatin (LIPITOR) 40 MG tablet Take 40 mg by mouth every evening.     calcium carbonate (TUMS - DOSED IN MG ELEMENTAL CALCIUM) 500 MG chewable tablet Chew 1 tablet by mouth daily as needed for indigestion or heartburn.     Calcium Citrate-Vitamin D (CALCIUM CITRATE + D PO) Take 1  tablet by mouth in the morning and at bedtime.     Cholecalciferol 25 MCG (1000 UT) tablet Take 1,000 Units by mouth at bedtime.     enalapril (VASOTEC) 20 MG tablet Take 2 tablets (40 mg total) by mouth daily. 180 tablet 3   folic acid (FOLVITE) 800 MCG tablet Take 800 mcg by mouth daily.     Garlic 1000 MG CAPS Take 1,000 mg by mouth at bedtime.     ibuprofen (ADVIL,MOTRIN) 200 MG tablet Take 400 mg by mouth every 4 (four) hours as needed for pain.      levothyroxine (SYNTHROID) 75 MCG tablet TAKE 1 TABLET BY MOUTH DAILY  BEFORE BREAKFAST 100 tablet 2   LORazepam (ATIVAN) 0.5 MG tablet Take 0.5 mg by mouth 3 (three) times daily as needed for sleep.      magnesium hydroxide (MILK OF MAGNESIA) 400 MG/5ML suspension Take 5 mLs by mouth daily.     metoprolol succinate (TOPROL-XL) 50 MG 24 hr tablet Take 50 mg by mouth daily.      Multiple Vitamin (MULTIVITAMIN) capsule Take 1 capsule by mouth daily.     Omega 3 1000 MG CAPS Take 1,000 mg by mouth at bedtime.     Oxycodone HCl 10 MG TABS Take 10 mg by mouth 4 (four) times daily as needed.     polyethylene glycol (MIRALAX / GLYCOLAX) 17 g packet Take 17 g by mouth daily.     PROAIR HFA 108 (90 BASE) MCG/ACT inhaler Inhale 2 puffs into the lungs every 6 (six) hours as needed for wheezing or shortness of breath.      pyridOXINE (VITAMIN B-6) 100 MG tablet Take 100 mg by mouth daily.     vitamin B-12 (CYANOCOBALAMIN) 500 MCG tablet Take 500 mcg by mouth at bedtime.     vitamin E 400 UNIT capsule Take 400 Units by mouth at bedtime.     zoledronic acid (RECLAST) 5 MG/100ML SOLN injection Inject 5 mg into the vein once.     No current facility-administered medications on file prior to visit.    Allergies  Allergen Reactions   Ciprofloxacin Hives   Clindamycin Hcl Hives   Iodinated Contrast Media Hives    Iohexol   Sulfa Antibiotics Hives   Ceftin  [Cefuroxime] Hives   Clarithromycin    Codeine    Cymbalta [Duloxetine Hcl]    Doxycycline     Pt states that this is not an allergy   Iohexol Hives   Levofloxacin    Metoclopramide Hcl    Penicillin G    Penicillins     Social History   Substance and Sexual Activity  Alcohol Use No    Social History   Tobacco Use  Smoking Status Former   Packs/day: 1.00   Years: 30.00   Additional pack years: 0.00   Total pack years: 30.00   Types: Cigarettes   Quit date: 12/26/1993   Years since quitting: 28.7  Smokeless Tobacco Never    Review of Systems  HENT: Negative.    Respiratory: Negative.    Cardiovascular: Negative.   Gastrointestinal: Negative.   Genitourinary: Negative.   Musculoskeletal:  Positive for back pain.  Skin: Negative.   Neurological: Negative.   Endo/Heme/Allergies: Negative.   Psychiatric/Behavioral: Negative.      Objective   Vitals:   09/03/22 1413  BP: 104/60  Pulse: 86   Resp: 12  Temp: 98.9 F (37.2 C)  SpO2: 94%    Physical Exam Vitals reviewed.  Constitutional:      Appearance: Normal appearance. She is normal weight. She is not ill-appearing.  HENT:     Head: Normocephalic.  Cardiovascular:     Rate and Rhythm: Normal rate and regular rhythm.     Heart sounds: Normal heart sounds. No murmur heard.    No friction rub. No gallop.  Pulmonary:     Effort: Pulmonary effort is normal. No respiratory distress.     Breath sounds: Normal breath sounds. No stridor. No wheezing, rhonchi or rales.  Abdominal:     General: Abdomen is flat. Bowel sounds are normal. There is no distension.     Palpations: Abdomen is soft. There is no mass.     Tenderness: There is no abdominal tenderness. There is no guarding or rebound.     Hernia: A hernia is present.     Comments: Easily reducible right inguinal hernia.  No left inguinal hernia.  Skin:    General: Skin is warm and dry.  Neurological:     Mental Status: She is alert and oriented to person, place, and time.   Previous office notes reviewed  Assessment  Right inguinal hernia, currently asymptomatic without evidence of incarceration Plan  I told the patient that given her recent spine surgery and the fact that she has a newly diagnosed left rib fracture, there is no need for acute surgical intervention at the present time.  In addition, she has not had any progression of symptoms from her right inguinal hernia from when I last saw her.  I did reassure her that she did not need to have the hernia fixed unless absolutely necessary.  She will return to my care should she have any worsening symptoms or questions.

## 2022-09-09 ENCOUNTER — Other Ambulatory Visit (HOSPITAL_COMMUNITY): Payer: Self-pay | Admitting: Internal Medicine

## 2022-09-09 DIAGNOSIS — M545 Low back pain, unspecified: Secondary | ICD-10-CM

## 2022-09-09 DIAGNOSIS — M4856XA Collapsed vertebra, not elsewhere classified, lumbar region, initial encounter for fracture: Secondary | ICD-10-CM

## 2022-09-11 ENCOUNTER — Other Ambulatory Visit: Payer: Self-pay

## 2022-09-17 DIAGNOSIS — Z0001 Encounter for general adult medical examination with abnormal findings: Secondary | ICD-10-CM | POA: Diagnosis not present

## 2022-09-17 DIAGNOSIS — E039 Hypothyroidism, unspecified: Secondary | ICD-10-CM | POA: Diagnosis not present

## 2022-09-17 DIAGNOSIS — E871 Hypo-osmolality and hyponatremia: Secondary | ICD-10-CM | POA: Diagnosis not present

## 2022-09-17 DIAGNOSIS — I10 Essential (primary) hypertension: Secondary | ICD-10-CM | POA: Diagnosis not present

## 2022-09-17 DIAGNOSIS — E559 Vitamin D deficiency, unspecified: Secondary | ICD-10-CM | POA: Diagnosis not present

## 2022-09-17 DIAGNOSIS — E7849 Other hyperlipidemia: Secondary | ICD-10-CM | POA: Diagnosis not present

## 2022-09-19 DIAGNOSIS — I10 Essential (primary) hypertension: Secondary | ICD-10-CM | POA: Diagnosis not present

## 2022-09-19 DIAGNOSIS — M5414 Radiculopathy, thoracic region: Secondary | ICD-10-CM | POA: Diagnosis not present

## 2022-09-19 DIAGNOSIS — J449 Chronic obstructive pulmonary disease, unspecified: Secondary | ICD-10-CM | POA: Diagnosis not present

## 2022-09-19 DIAGNOSIS — Z681 Body mass index (BMI) 19 or less, adult: Secondary | ICD-10-CM | POA: Diagnosis not present

## 2022-09-19 DIAGNOSIS — S22000A Wedge compression fracture of unspecified thoracic vertebra, initial encounter for closed fracture: Secondary | ICD-10-CM | POA: Diagnosis not present

## 2022-09-19 DIAGNOSIS — M8000XA Age-related osteoporosis with current pathological fracture, unspecified site, initial encounter for fracture: Secondary | ICD-10-CM | POA: Diagnosis not present

## 2022-09-19 DIAGNOSIS — M545 Low back pain, unspecified: Secondary | ICD-10-CM | POA: Diagnosis not present

## 2022-09-19 DIAGNOSIS — M4856XA Collapsed vertebra, not elsewhere classified, lumbar region, initial encounter for fracture: Secondary | ICD-10-CM | POA: Diagnosis not present

## 2022-09-20 ENCOUNTER — Other Ambulatory Visit (HOSPITAL_COMMUNITY): Payer: Self-pay | Admitting: Internal Medicine

## 2022-09-20 DIAGNOSIS — M545 Low back pain, unspecified: Secondary | ICD-10-CM

## 2022-09-20 DIAGNOSIS — M5414 Radiculopathy, thoracic region: Secondary | ICD-10-CM

## 2022-09-23 ENCOUNTER — Other Ambulatory Visit (HOSPITAL_COMMUNITY): Payer: Self-pay | Admitting: Internal Medicine

## 2022-09-23 DIAGNOSIS — M5414 Radiculopathy, thoracic region: Secondary | ICD-10-CM

## 2022-09-23 DIAGNOSIS — R1013 Epigastric pain: Secondary | ICD-10-CM | POA: Diagnosis not present

## 2022-09-23 DIAGNOSIS — F5101 Primary insomnia: Secondary | ICD-10-CM | POA: Diagnosis not present

## 2022-09-23 DIAGNOSIS — J449 Chronic obstructive pulmonary disease, unspecified: Secondary | ICD-10-CM | POA: Diagnosis not present

## 2022-09-23 DIAGNOSIS — M545 Low back pain, unspecified: Secondary | ICD-10-CM

## 2022-09-23 DIAGNOSIS — Z681 Body mass index (BMI) 19 or less, adult: Secondary | ICD-10-CM | POA: Diagnosis not present

## 2022-09-23 DIAGNOSIS — R490 Dysphonia: Secondary | ICD-10-CM | POA: Diagnosis not present

## 2022-09-23 DIAGNOSIS — K921 Melena: Secondary | ICD-10-CM | POA: Diagnosis not present

## 2022-09-23 DIAGNOSIS — I10 Essential (primary) hypertension: Secondary | ICD-10-CM | POA: Diagnosis not present

## 2022-09-24 ENCOUNTER — Encounter (INDEPENDENT_AMBULATORY_CARE_PROVIDER_SITE_OTHER): Payer: Self-pay | Admitting: *Deleted

## 2022-09-24 DIAGNOSIS — S22060G Wedge compression fracture of T7-T8 vertebra, subsequent encounter for fracture with delayed healing: Secondary | ICD-10-CM | POA: Diagnosis not present

## 2022-09-24 DIAGNOSIS — Z681 Body mass index (BMI) 19 or less, adult: Secondary | ICD-10-CM | POA: Diagnosis not present

## 2022-09-25 DIAGNOSIS — M5134 Other intervertebral disc degeneration, thoracic region: Secondary | ICD-10-CM | POA: Diagnosis not present

## 2022-09-25 DIAGNOSIS — M4316 Spondylolisthesis, lumbar region: Secondary | ICD-10-CM | POA: Diagnosis not present

## 2022-09-25 DIAGNOSIS — M5136 Other intervertebral disc degeneration, lumbar region: Secondary | ICD-10-CM | POA: Diagnosis not present

## 2022-09-25 DIAGNOSIS — M4315 Spondylolisthesis, thoracolumbar region: Secondary | ICD-10-CM | POA: Diagnosis not present

## 2022-09-25 DIAGNOSIS — M4805 Spinal stenosis, thoracolumbar region: Secondary | ICD-10-CM | POA: Diagnosis not present

## 2022-09-25 DIAGNOSIS — M40205 Unspecified kyphosis, thoracolumbar region: Secondary | ICD-10-CM | POA: Diagnosis not present

## 2022-09-25 DIAGNOSIS — M48061 Spinal stenosis, lumbar region without neurogenic claudication: Secondary | ICD-10-CM | POA: Diagnosis not present

## 2022-09-25 DIAGNOSIS — M438X5 Other specified deforming dorsopathies, thoracolumbar region: Secondary | ICD-10-CM | POA: Diagnosis not present

## 2022-09-25 DIAGNOSIS — Z87311 Personal history of (healed) other pathological fracture: Secondary | ICD-10-CM | POA: Diagnosis not present

## 2022-09-25 DIAGNOSIS — M47816 Spondylosis without myelopathy or radiculopathy, lumbar region: Secondary | ICD-10-CM | POA: Diagnosis not present

## 2022-09-25 DIAGNOSIS — M4854XA Collapsed vertebra, not elsewhere classified, thoracic region, initial encounter for fracture: Secondary | ICD-10-CM | POA: Diagnosis not present

## 2022-09-25 DIAGNOSIS — M4807 Spinal stenosis, lumbosacral region: Secondary | ICD-10-CM | POA: Diagnosis not present

## 2022-09-25 DIAGNOSIS — R937 Abnormal findings on diagnostic imaging of other parts of musculoskeletal system: Secondary | ICD-10-CM | POA: Diagnosis not present

## 2022-09-25 DIAGNOSIS — M438X6 Other specified deforming dorsopathies, lumbar region: Secondary | ICD-10-CM | POA: Diagnosis not present

## 2022-09-25 DIAGNOSIS — M5135 Other intervertebral disc degeneration, thoracolumbar region: Secondary | ICD-10-CM | POA: Diagnosis not present

## 2022-10-02 DIAGNOSIS — L638 Other alopecia areata: Secondary | ICD-10-CM | POA: Diagnosis not present

## 2022-10-02 DIAGNOSIS — L0202 Furuncle of face: Secondary | ICD-10-CM | POA: Diagnosis not present

## 2022-10-02 DIAGNOSIS — L570 Actinic keratosis: Secondary | ICD-10-CM | POA: Diagnosis not present

## 2022-10-02 DIAGNOSIS — X32XXXD Exposure to sunlight, subsequent encounter: Secondary | ICD-10-CM | POA: Diagnosis not present

## 2022-10-02 DIAGNOSIS — B9689 Other specified bacterial agents as the cause of diseases classified elsewhere: Secondary | ICD-10-CM | POA: Diagnosis not present

## 2022-10-02 DIAGNOSIS — L820 Inflamed seborrheic keratosis: Secondary | ICD-10-CM | POA: Diagnosis not present

## 2022-10-07 ENCOUNTER — Ambulatory Visit (INDEPENDENT_AMBULATORY_CARE_PROVIDER_SITE_OTHER): Payer: Medicare Other | Admitting: Gastroenterology

## 2022-11-08 DIAGNOSIS — R29898 Other symptoms and signs involving the musculoskeletal system: Secondary | ICD-10-CM | POA: Diagnosis not present

## 2022-11-08 DIAGNOSIS — M5489 Other dorsalgia: Secondary | ICD-10-CM | POA: Diagnosis not present

## 2022-11-08 DIAGNOSIS — R2689 Other abnormalities of gait and mobility: Secondary | ICD-10-CM | POA: Diagnosis not present

## 2022-11-08 DIAGNOSIS — S22060G Wedge compression fracture of T7-T8 vertebra, subsequent encounter for fracture with delayed healing: Secondary | ICD-10-CM | POA: Diagnosis not present

## 2022-11-14 DIAGNOSIS — G4700 Insomnia, unspecified: Secondary | ICD-10-CM | POA: Diagnosis not present

## 2022-11-14 DIAGNOSIS — M810 Age-related osteoporosis without current pathological fracture: Secondary | ICD-10-CM | POA: Diagnosis not present

## 2022-11-14 DIAGNOSIS — I10 Essential (primary) hypertension: Secondary | ICD-10-CM | POA: Diagnosis not present

## 2022-11-14 DIAGNOSIS — E890 Postprocedural hypothyroidism: Secondary | ICD-10-CM | POA: Diagnosis not present

## 2022-11-14 DIAGNOSIS — E0500 Thyrotoxicosis with diffuse goiter without thyrotoxic crisis or storm: Secondary | ICD-10-CM | POA: Diagnosis not present

## 2022-11-15 DIAGNOSIS — G4700 Insomnia, unspecified: Secondary | ICD-10-CM | POA: Diagnosis not present

## 2022-11-15 DIAGNOSIS — J449 Chronic obstructive pulmonary disease, unspecified: Secondary | ICD-10-CM | POA: Diagnosis not present

## 2022-11-15 DIAGNOSIS — Z681 Body mass index (BMI) 19 or less, adult: Secondary | ICD-10-CM | POA: Diagnosis not present

## 2022-11-15 DIAGNOSIS — F5101 Primary insomnia: Secondary | ICD-10-CM | POA: Diagnosis not present

## 2022-11-15 DIAGNOSIS — I10 Essential (primary) hypertension: Secondary | ICD-10-CM | POA: Diagnosis not present

## 2022-12-02 ENCOUNTER — Telehealth: Payer: Self-pay | Admitting: Internal Medicine

## 2022-12-02 NOTE — Telephone Encounter (Signed)
Pt c/o BP issue: STAT if pt c/o blurred vision, one-sided weakness or slurred speech  1. What are your last 5 BP readings?  220/108 11/15/22 141/89 11/16/22 96/65 11/17/22 122/75 11/18/22 114/71 11/19/22 145/87 11/20/22 120/74 11/22/22 100/63 11/23/22 125/72 11/24/22 139/89 12/02/22 161/94  2. Are you having any other symptoms (ex. Dizziness, headache, blurred vision, passed out)? Weakness and headache   3. What is your BP issue? Fluctuating BP   Patient is requesting to be worked in for an appointment regarding her symptoms.

## 2022-12-02 NOTE — Telephone Encounter (Signed)
Spoke to pt who stated she is experiencing elevated bp. Pt stated that she has been having headaches and weakness. Pt denied sob. Pt requested an appt with provider- 10/23 is the first available in [place]. Pt stated that amlodipine was increased to 5 mg tablet by PCP. Pt stated that this does not help.  Please advise.

## 2022-12-10 DIAGNOSIS — R29898 Other symptoms and signs involving the musculoskeletal system: Secondary | ICD-10-CM | POA: Diagnosis not present

## 2022-12-10 DIAGNOSIS — M5489 Other dorsalgia: Secondary | ICD-10-CM | POA: Diagnosis not present

## 2022-12-10 DIAGNOSIS — S22060G Wedge compression fracture of T7-T8 vertebra, subsequent encounter for fracture with delayed healing: Secondary | ICD-10-CM | POA: Diagnosis not present

## 2022-12-10 DIAGNOSIS — R2689 Other abnormalities of gait and mobility: Secondary | ICD-10-CM | POA: Diagnosis not present

## 2022-12-12 ENCOUNTER — Encounter: Payer: Self-pay | Admitting: Internal Medicine

## 2022-12-12 ENCOUNTER — Ambulatory Visit: Payer: Medicare Other | Attending: Internal Medicine | Admitting: Internal Medicine

## 2022-12-12 VITALS — BP 110/70 | HR 90 | Ht 63.0 in | Wt 106.0 lb

## 2022-12-12 DIAGNOSIS — I2510 Atherosclerotic heart disease of native coronary artery without angina pectoris: Secondary | ICD-10-CM | POA: Diagnosis not present

## 2022-12-12 DIAGNOSIS — I10 Essential (primary) hypertension: Secondary | ICD-10-CM | POA: Diagnosis not present

## 2022-12-12 MED ORDER — AMLODIPINE BESYLATE 5 MG PO TABS: 5.0000 mg | ORAL_TABLET | Freq: Every day | ORAL | 3 refills | Status: DC

## 2022-12-12 NOTE — Progress Notes (Signed)
Cardiology Office Note  Date: 12/12/2022   ID: Belinda Clark, DOB 09/28/1940, MRN [id number]  PCP:  Elfredia Nevins, MD  Cardiologist:  Marjo Bicker, MD Electrophysiologist:  None   History of Present Illness: Belinda Clark is a 82 y.o. female known to have carotid artery stenosis s/p left carotid endarterectomy with residual 1 to 39% bilateral carotid artery stenosis (followed by vascular surgery) in 2019, elevated coronary calcium score 1400 in 2014 (nuclear stress test was normal), HTN, HLD presented to cardiology clinic for follow-up visit.   Patient had thoracic spine fracture recently in 03/2022 and underwent kyphoplasty in 05/2022.  She recently had elevated blood pressure reading 220 mm Hg SBP at her endocrinologist office due to which her amlodipine dose was increased from 5 mg to 10 mg once daily but patient did not start taking the medication yet.  She still taking amlodipine 5 mg once a day along with her remaining antihypertensive medications, metoprolol succinate 50 mg once daily and enalapril 40 mg once daily if her BP>110 mm Hg in am. She brought home BP log today which I reviewed.  A.m. blood pressures are in the range 80 to 120 mmHg and there are couple of outliers of 150 mmHg SBP.  For most part, blood pressures are controlled.  She also complained of headaches daily and does not particularly had any symptoms of severe headache, DOE, chest pain at the time of 220 mmHg reading at her endocrine doctor's office.  She [place] 2 miles, had 1 episode of chest pain did not last long and resolved.  No history of prior chest pains or recurrences.  No DOE, orthopnea, PND or syncope.  She did feel dizzy in the last 1 month but does not remember the frequency but is not frequent according to her.  She is feeling tired all the time.  Past Medical History:  Diagnosis Date   Anxiety    Arthritis    Asthma    Carotid artery stenosis    40-59 % -Left   Elevated  coronary artery calcium score 10/07/2012   Negative stress echo 2016   GERD (gastroesophageal reflux disease)    Glaucoma    High blood pressure    HTN (hypertension)    Hyperlipidemia    Hypothyroidism    PONV (postoperative nausea and vomiting)    also sore throat after back surgery    Past Surgical History:  Procedure Laterality Date   BREAST BIOPSY     x3 left   CATARACT EXTRACTION W/ INTRAOCULAR LENS  IMPLANT, BILATERAL     COLONOSCOPY N/A 10/05/2013   Procedure: COLONOSCOPY;  Surgeon: Dalia Heading, MD;  Location: [facility];  Service: Gastroenterology;  Laterality: N/A;   ENDARTERECTOMY Left 05/19/2017   Procedure: ENDARTERECTOMY CAROTID LEFT;  Surgeon: Larina Earthly, MD;  Location: [facility] OR;  Service: Vascular;  Laterality: Left;   FRACTURE SURGERY     Spinal Fx 06/30/11- pt denies   KYPHOPLASTY N/A 06/03/2022   Procedure: KYPHOPLASTY, Thoracic Six, Thoracic Seven, Thoracic Eight;  Surgeon: Tressie Stalker, MD;  Location: [facility] OR;  Service: Neurosurgery;  Laterality: N/A;   NECK MASS EXCISION     PATCH ANGIOPLASTY Left 05/19/2017   Procedure: PATCH ANGIOPLASTY LEFT CAROTID ARTERY USING HEMASHIELD PLATINUM FINESSE PATCH;  Surgeon: Larina Earthly, MD;  Location: [facility] OR;  Service: Vascular;  Laterality: Left;   RHINOPLASTY     3 total surgeries   SPINE SURGERY  06/30/2011  pt states surgery was 2002    THYROIDECTOMY     TONSILLECTOMY      Current Outpatient Medications  Medication Sig Dispense Refill   Abaloparatide (TYMLOS) 3120 MCG/1.56ML SOPN Inject 80 mcg into the skin daily at 12 noon.     amitriptyline (ELAVIL) 25 MG tablet Take 25 mg by mouth at bedtime.     Ascorbic Acid (VITAMIN C) 1000 MG tablet Take 1,000 mg by mouth at bedtime.     aspirin EC 81 MG tablet Take 1 tablet (81 mg total) by mouth daily. Swallow whole. 90 tablet 3   atorvastatin (LIPITOR) 40 MG tablet Take 40 mg by mouth every evening.     calcium carbonate (TUMS - DOSED IN MG ELEMENTAL CALCIUM) 500  MG chewable tablet Chew 1 tablet by mouth daily as needed for indigestion or heartburn.     Calcium Citrate-Vitamin D (CALCIUM CITRATE + D PO) Take 1 tablet by mouth in the morning and at bedtime.     Cholecalciferol 25 MCG (1000 UT) tablet Take 1,000 Units by mouth at bedtime.     fluticasone (CUTIVATE) 0.05 % cream Apply 1 Application topically 2 (two) times daily as needed.     folic acid (FOLVITE) 800 MCG tablet Take 800 mcg by mouth daily.     Garlic 1000 MG CAPS Take 1,000 mg by mouth at bedtime.     hydrOXYzine (ATARAX) 25 MG tablet Take 25 mg by mouth every 6 (six) hours as needed.     ibuprofen (ADVIL,MOTRIN) 200 MG tablet Take 400 mg by mouth every 4 (four) hours as needed for pain.      LORazepam (ATIVAN) 0.5 MG tablet Take 0.5 mg by mouth 3 (three) times daily as needed for sleep.      magnesium hydroxide (MILK OF MAGNESIA) 400 MG/5ML suspension Take 5 mLs by mouth daily as needed.     meloxicam (MOBIC) 7.5 MG tablet Take 7.5 mg by mouth 2 (two) times daily.     metoprolol succinate (TOPROL-XL) 50 MG 24 hr tablet Take 50 mg by mouth daily.     Multiple Vitamin (MULTIVITAMIN) capsule Take 1 capsule by mouth daily.     Omega 3 1000 MG CAPS Take 1,000 mg by mouth at bedtime.     omeprazole (PRILOSEC) 20 MG capsule Take 20 mg by mouth daily.     PROAIR HFA 108 (90 BASE) MCG/ACT inhaler Inhale 2 puffs into the lungs every 6 (six) hours as needed for wheezing or shortness of breath.      pyridOXINE (VITAMIN B-6) 100 MG tablet Take 100 mg by mouth daily.     SYNTHROID 112 MCG tablet Take 112 mcg by mouth daily before breakfast.     vitamin B-12 (CYANOCOBALAMIN) 500 MCG tablet Take 500 mcg by mouth at bedtime.     vitamin E 400 UNIT capsule Take 400 Units by mouth at bedtime.     amLODipine (NORVASC) 5 MG tablet Take 1 tablet (5 mg total) by mouth daily. 90 tablet 3   No current facility-administered medications for this visit.   Allergies:  Ciprofloxacin, Clindamycin hcl, Iodinated  contrast media, Sulfa antibiotics, Ceftin  [cefuroxime], Clarithromycin, Codeine, Cymbalta [duloxetine hcl], Doxycycline, Iohexol, Levofloxacin, Metoclopramide hcl, Penicillin g, and Penicillins   Social History: The patient  reports that she quit smoking about 28 years ago. Her smoking use included cigarettes. She started smoking about 59 years ago. She has a 30 pack-year smoking history. She has never used smokeless tobacco. She reports  that she does not drink alcohol and does not use drugs.   Family History: The patient's family history includes Diabetes in her mother and another family member; Heart attack in her brother and father; Heart disease in her brother, father, and another family member; Hyperlipidemia in her brother, father, and mother; Hypertension in her mother; Stroke in her brother and maternal grandmother.   ROS:  Please see the history of present illness. Otherwise, complete review of systems is positive for none.  All other systems are reviewed and negative.   Physical Exam: VS:  BP 110/70   Pulse 90   Ht 5\' 3"  (1.6 m)   Wt 106 lb (48.1 kg)   SpO2 92%   BMI 18.78 kg/m , BMI Body mass index is 18.78 kg/m.  Wt Readings from Last 3 Encounters:  12/12/22 106 lb (48.1 kg)  09/03/22 103 lb (46.7 kg)  06/26/22 99 lb (44.9 kg)    General: Patient appears comfortable at rest. HEENT: Conjunctiva and lids normal, oropharynx clear with moist mucosa. Neck: Supple, no elevated JVP or carotid bruits, no thyromegaly. Lungs: Clear to auscultation, nonlabored breathing at rest. Cardiac: Regular rate and rhythm, no S3 or significant systolic murmur, no pericardial rub. Abdomen: Soft, nontender, no hepatomegaly, bowel sounds present, no guarding or rebound. Extremities: No pitting edema, distal pulses 2+. Skin: Warm and dry. Musculoskeletal: No kyphosis. Neuropsychiatric: Alert and oriented x3, affect grossly appropriate.  Recent Labwork: 05/27/2022: BUN 14; Creatinine, Ser 0.64;  Hemoglobin 12.5; Platelets 287; Potassium 4.1; Sodium 137  No results found for: "CHOL", "TRIG", "HDL", "CHOLHDL", "VLDL", "LDLCALC", "LDLDIRECT"   Assessment and Plan:  Labile HTN Elevated coronary calcium score 1400 in 2014 Carotid artery stenosis s/p L CEA with residual 1-39% bilateral CAS HLD, unknown values   -BP 220 mmHg SBP at her endocrinologist office in September 2024 due to which amlodipine was increased from 5 mg to 10 mg once daily. However patient was still taking 5 mg once daily and has not increased the dose.  She did not have any particular symptoms of DOE, chest pain, blurry vision, severe headache  (although she does report daily headaches) when her blood pressure was 220 mmHg SBP.  This could be an element of whitecoat hypertension, measurement error etc.  She started to check her blood pressures at home daily, she brought home BP log which I reviewed today in the clinic that showed BPs ranging between 80 and 120 mmHg SBP in the a.m. prior to taking any antihypertensive medications.  She did have a couple of BP numbers in the morning that was around 150 mmHg SBP.  She was taking metoprolol succinate 50 mg once daily, amlodipine 5 mg once daily and enalapril 40 mg once daily only if a.m. BP is more than 110 mmHg SBP.  I will discontinue enalapril and instructed her to take antihypertensive medications (amlodipine 5 mg and metoprolol succinate 50 mg once daily) only if the a.m. BP is more than 120 mmHg. Keep checking blood pressures in a.m. and p.m.  Will have a nurse visit to have her blood pressure machine calibrated. -She hiked amounting yesterday, 2 miles, had 1 episode of chest pain which resolved.  Otherwise no history of chest pains or recurrences.  No indication of stress testing at this time.  She is feeling extremely tired all day, instructed patient to follow-up with PCP.  I spent a total duration of 30 minutes reviewing prior notes, reviewing home blood pressure log  carefully, reviewed medications, face-to-face  discussion of her medical condition, pathophysiology, evaluation, and management, ordering medication and documentation of the findings in the note.   Disposition:  Follow up 6 months  Signed, Rolfe Hartsell Verne Spurr, MD, 12/12/2022 2:40 PM    [facility] at [facility] [address], [place], [place] [address]

## 2022-12-12 NOTE — Patient Instructions (Signed)
Medication Instructions:  Your physician has recommended you make the following change in your medication:   -Stop Enalipril -Decrease Amlodipine to 5 mg once daily   *If you need a refill on your cardiac medications before your next appointment, please call your pharmacy*   Lab Work: None If you have labs (blood work) drawn today and your tests are completely normal, you will receive your results only by: MyChart Message (if you have MyChart) OR A paper copy in the mail If you have any lab test that is abnormal or we need to change your treatment, we will call you to review the results.   Testing/Procedures: None   Follow-Up: At [facility], you and your health needs are our priority.  As part of our continuing mission to provide you with exceptional heart care, we have created designated Provider Care Teams.  These Care Teams include your primary Cardiologist (physician) and Advanced Practice Providers (APPs -  Physician Assistants and Nurse Practitioners) who all work together to provide you with the care you need, when you need it.  We recommend signing up for the patient portal called "MyChart".  Sign up information is provided on this After Visit Summary.  MyChart is used to connect with patients for Virtual Visits (Telemedicine).  Patients are able to view lab/test results, encounter notes, upcoming appointments, etc.  Non-urgent messages can be sent to your provider as well.   To learn more about what you can do with MyChart, go to [email or web address].    Your next appointment:   6 month(s)  Provider:   Luane School, MD    Other Instructions Nurse Visit- BP monitor.  Please only take blood pressure medications for blood pressure about 120.

## 2022-12-18 ENCOUNTER — Ambulatory Visit: Payer: Medicare Other | Admitting: Internal Medicine

## 2022-12-18 DIAGNOSIS — M810 Age-related osteoporosis without current pathological fracture: Secondary | ICD-10-CM | POA: Diagnosis not present

## 2022-12-18 DIAGNOSIS — I10 Essential (primary) hypertension: Secondary | ICD-10-CM | POA: Diagnosis not present

## 2022-12-18 DIAGNOSIS — E890 Postprocedural hypothyroidism: Secondary | ICD-10-CM | POA: Diagnosis not present

## 2022-12-18 DIAGNOSIS — Z8781 Personal history of (healed) traumatic fracture: Secondary | ICD-10-CM | POA: Diagnosis not present

## 2022-12-18 DIAGNOSIS — E0500 Thyrotoxicosis with diffuse goiter without thyrotoxic crisis or storm: Secondary | ICD-10-CM | POA: Diagnosis not present

## 2023-01-01 ENCOUNTER — Ambulatory Visit: Payer: Medicare Other

## 2023-01-07 ENCOUNTER — Ambulatory Visit: Payer: Medicare Other

## 2023-01-09 ENCOUNTER — Ambulatory Visit: Payer: Medicare Other | Attending: Cardiology | Admitting: *Deleted

## 2023-01-09 VITALS — BP 142/88 | HR 94 | Wt 103.0 lb

## 2023-01-09 DIAGNOSIS — X32XXXD Exposure to sunlight, subsequent encounter: Secondary | ICD-10-CM | POA: Diagnosis not present

## 2023-01-09 DIAGNOSIS — Z0130 Encounter for examination of blood pressure without abnormal findings: Secondary | ICD-10-CM

## 2023-01-09 DIAGNOSIS — L570 Actinic keratosis: Secondary | ICD-10-CM | POA: Diagnosis not present

## 2023-01-09 DIAGNOSIS — Z85828 Personal history of other malignant neoplasm of skin: Secondary | ICD-10-CM | POA: Diagnosis not present

## 2023-01-09 DIAGNOSIS — Z08 Encounter for follow-up examination after completed treatment for malignant neoplasm: Secondary | ICD-10-CM | POA: Diagnosis not present

## 2023-01-09 NOTE — Progress Notes (Signed)
Pt in office for BP check. Home monitor reads 149/95 Hr 94. Pt states that she did not take he meds today. Pt's monitor reads 117/79 Hr 91 from morning reading.

## 2023-01-10 ENCOUNTER — Telehealth: Payer: Self-pay | Admitting: *Deleted

## 2023-01-10 ENCOUNTER — Telehealth: Payer: Self-pay | Admitting: Internal Medicine

## 2023-01-10 NOTE — Telephone Encounter (Signed)
Patient returned RN's call. 

## 2023-01-10 NOTE — Telephone Encounter (Signed)
-----   Message from Vishnu P Mallipeddi sent at 01/10/2023 12:45 PM EST ----- Blood pressures controlled. Continue current meds. No changes. ----- Message ----- From: Kerney Elbe, LPN Sent: [id number]   4:09 PM EST To: Marjo Bicker, MD

## 2023-01-10 NOTE — Telephone Encounter (Signed)
Pt notified and voiced understanding 

## 2023-01-10 NOTE — Telephone Encounter (Signed)
Called pt no answer. Left msg to call back.  

## 2023-01-13 DIAGNOSIS — G4700 Insomnia, unspecified: Secondary | ICD-10-CM | POA: Diagnosis not present

## 2023-01-13 DIAGNOSIS — R5383 Other fatigue: Secondary | ICD-10-CM | POA: Diagnosis not present

## 2023-01-13 DIAGNOSIS — F5101 Primary insomnia: Secondary | ICD-10-CM | POA: Diagnosis not present

## 2023-01-13 DIAGNOSIS — I10 Essential (primary) hypertension: Secondary | ICD-10-CM | POA: Diagnosis not present

## 2023-01-13 DIAGNOSIS — J449 Chronic obstructive pulmonary disease, unspecified: Secondary | ICD-10-CM | POA: Diagnosis not present

## 2023-01-13 DIAGNOSIS — Z681 Body mass index (BMI) 19 or less, adult: Secondary | ICD-10-CM | POA: Diagnosis not present

## 2023-01-22 ENCOUNTER — Telehealth: Payer: Self-pay | Admitting: Internal Medicine

## 2023-01-22 NOTE — Telephone Encounter (Signed)
AM               PM  11/22   102/66 95   97/65  93 11/23    91/66 95     140/90  90 11/24    125/81 91    117/80  92 11/25    117/80  87    180/102 108 11/26    99/71  93      82/57   93

## 2023-01-27 NOTE — Telephone Encounter (Signed)
Patient notified and patient verbalized understanding.

## 2023-01-28 DIAGNOSIS — R5383 Other fatigue: Secondary | ICD-10-CM | POA: Diagnosis not present

## 2023-02-11 DIAGNOSIS — E039 Hypothyroidism, unspecified: Secondary | ICD-10-CM | POA: Diagnosis not present

## 2023-02-11 DIAGNOSIS — L720 Epidermal cyst: Secondary | ICD-10-CM | POA: Diagnosis not present

## 2023-02-11 DIAGNOSIS — R6889 Other general symptoms and signs: Secondary | ICD-10-CM | POA: Diagnosis not present

## 2023-02-11 DIAGNOSIS — I10 Essential (primary) hypertension: Secondary | ICD-10-CM | POA: Diagnosis not present

## 2023-02-11 DIAGNOSIS — J449 Chronic obstructive pulmonary disease, unspecified: Secondary | ICD-10-CM | POA: Diagnosis not present

## 2023-02-11 DIAGNOSIS — Z681 Body mass index (BMI) 19 or less, adult: Secondary | ICD-10-CM | POA: Diagnosis not present

## 2023-03-03 DIAGNOSIS — M810 Age-related osteoporosis without current pathological fracture: Secondary | ICD-10-CM | POA: Diagnosis not present

## 2023-03-03 DIAGNOSIS — E0500 Thyrotoxicosis with diffuse goiter without thyrotoxic crisis or storm: Secondary | ICD-10-CM | POA: Diagnosis not present

## 2023-03-03 DIAGNOSIS — E890 Postprocedural hypothyroidism: Secondary | ICD-10-CM | POA: Diagnosis not present

## 2023-03-03 DIAGNOSIS — Z87898 Personal history of other specified conditions: Secondary | ICD-10-CM | POA: Diagnosis not present

## 2023-03-03 DIAGNOSIS — I10 Essential (primary) hypertension: Secondary | ICD-10-CM | POA: Diagnosis not present

## 2023-04-17 DIAGNOSIS — D225 Melanocytic nevi of trunk: Secondary | ICD-10-CM | POA: Diagnosis not present

## 2023-04-17 DIAGNOSIS — L608 Other nail disorders: Secondary | ICD-10-CM | POA: Diagnosis not present

## 2023-04-17 DIAGNOSIS — B351 Tinea unguium: Secondary | ICD-10-CM | POA: Diagnosis not present

## 2023-04-17 DIAGNOSIS — Z1283 Encounter for screening for malignant neoplasm of skin: Secondary | ICD-10-CM | POA: Diagnosis not present

## 2023-05-16 ENCOUNTER — Telehealth: Payer: Self-pay | Admitting: Internal Medicine

## 2023-05-16 MED ORDER — CARVEDILOL 3.125 MG PO TABS
3.1250 mg | ORAL_TABLET | Freq: Two times a day (BID) | ORAL | 3 refills | Status: DC
Start: 2023-05-16 — End: 2023-07-17

## 2023-05-16 NOTE — Telephone Encounter (Signed)
 Pt c/o BP issue: STAT if pt c/o blurred vision, one-sided weakness or slurred speech.  STAT if BP is GREATER than 180/120 TODAY.  STAT if BP is LESS than 90/60 and SYMPTOMATIC TODAY  1. What is your BP concern? Too high  2. Have you taken any BP medication today?no   3. What are your last 5 BP readings?165/114 84, 169/102 84, 183/105 84, 158/94 86  4. Are you having any other symptoms (ex. Dizziness, headache, blurred vision, passed out)? headache

## 2023-05-16 NOTE — Telephone Encounter (Signed)
 [place]'s [place] been elevated. See recordings below.   She states her endocrinologist, Dr.Kern increased her amlodipine to 10 mg back in December for elevated readings.She had stopped metoprolol and is only on Amlodipine   Please advise

## 2023-05-16 NOTE — Telephone Encounter (Signed)
 Patient agrees to start Coreg 3.125 mg bid and will monitor bp's, [place]

## 2023-06-03 DIAGNOSIS — M8000XA Age-related osteoporosis with current pathological fracture, unspecified site, initial encounter for fracture: Secondary | ICD-10-CM | POA: Diagnosis not present

## 2023-06-03 DIAGNOSIS — Z681 Body mass index (BMI) 19 or less, adult: Secondary | ICD-10-CM | POA: Diagnosis not present

## 2023-06-03 DIAGNOSIS — T50905A Adverse effect of unspecified drugs, medicaments and biological substances, initial encounter: Secondary | ICD-10-CM | POA: Diagnosis not present

## 2023-06-03 DIAGNOSIS — J449 Chronic obstructive pulmonary disease, unspecified: Secondary | ICD-10-CM | POA: Diagnosis not present

## 2023-06-03 DIAGNOSIS — I10 Essential (primary) hypertension: Secondary | ICD-10-CM | POA: Diagnosis not present

## 2023-06-03 DIAGNOSIS — L720 Epidermal cyst: Secondary | ICD-10-CM | POA: Diagnosis not present

## 2023-06-03 DIAGNOSIS — Z9229 Personal history of other drug therapy: Secondary | ICD-10-CM | POA: Diagnosis not present

## 2023-06-06 ENCOUNTER — Telehealth: Payer: Self-pay | Admitting: *Deleted

## 2023-06-06 NOTE — Telephone Encounter (Signed)
 Received call from patient [phone]~ telephone.   Reports that she is having increased episodes of groin pain from inguinal hernia on R side.   Advised of emergent Sx to go to ER.  Appointment scheduled.

## 2023-06-16 ENCOUNTER — Other Ambulatory Visit: Payer: Self-pay | Admitting: *Deleted

## 2023-06-16 DIAGNOSIS — K4090 Unilateral inguinal hernia, without obstruction or gangrene, not specified as recurrent: Secondary | ICD-10-CM

## 2023-06-17 ENCOUNTER — Ambulatory Visit: Admitting: General Surgery

## 2023-06-19 DIAGNOSIS — Z0001 Encounter for general adult medical examination with abnormal findings: Secondary | ICD-10-CM | POA: Diagnosis not present

## 2023-06-19 DIAGNOSIS — E782 Mixed hyperlipidemia: Secondary | ICD-10-CM | POA: Diagnosis not present

## 2023-06-19 DIAGNOSIS — E559 Vitamin D deficiency, unspecified: Secondary | ICD-10-CM | POA: Diagnosis not present

## 2023-06-19 DIAGNOSIS — R6889 Other general symptoms and signs: Secondary | ICD-10-CM | POA: Diagnosis not present

## 2023-06-30 DIAGNOSIS — M810 Age-related osteoporosis without current pathological fracture: Secondary | ICD-10-CM | POA: Diagnosis not present

## 2023-06-30 DIAGNOSIS — E890 Postprocedural hypothyroidism: Secondary | ICD-10-CM | POA: Diagnosis not present

## 2023-06-30 DIAGNOSIS — E0500 Thyrotoxicosis with diffuse goiter without thyrotoxic crisis or storm: Secondary | ICD-10-CM | POA: Diagnosis not present

## 2023-07-08 ENCOUNTER — Ambulatory Visit: Admitting: General Surgery

## 2023-07-10 DIAGNOSIS — Z85828 Personal history of other malignant neoplasm of skin: Secondary | ICD-10-CM | POA: Diagnosis not present

## 2023-07-10 DIAGNOSIS — L910 Hypertrophic scar: Secondary | ICD-10-CM | POA: Diagnosis not present

## 2023-07-10 DIAGNOSIS — L708 Other acne: Secondary | ICD-10-CM | POA: Diagnosis not present

## 2023-07-10 DIAGNOSIS — L720 Epidermal cyst: Secondary | ICD-10-CM | POA: Diagnosis not present

## 2023-07-10 DIAGNOSIS — Z08 Encounter for follow-up examination after completed treatment for malignant neoplasm: Secondary | ICD-10-CM | POA: Diagnosis not present

## 2023-07-10 DIAGNOSIS — D485 Neoplasm of uncertain behavior of skin: Secondary | ICD-10-CM | POA: Diagnosis not present

## 2023-07-10 DIAGNOSIS — L814 Other melanin hyperpigmentation: Secondary | ICD-10-CM | POA: Diagnosis not present

## 2023-07-16 ENCOUNTER — Other Ambulatory Visit: Payer: Self-pay | Admitting: Internal Medicine

## 2023-07-16 DIAGNOSIS — Z1231 Encounter for screening mammogram for malignant neoplasm of breast: Secondary | ICD-10-CM

## 2023-07-17 ENCOUNTER — Ambulatory Visit (INDEPENDENT_AMBULATORY_CARE_PROVIDER_SITE_OTHER): Admitting: General Surgery

## 2023-07-17 ENCOUNTER — Encounter: Payer: Self-pay | Admitting: Medical

## 2023-07-17 ENCOUNTER — Encounter: Payer: Self-pay | Admitting: General Surgery

## 2023-07-17 ENCOUNTER — Ambulatory Visit
Admission: RE | Admit: 2023-07-17 | Discharge: 2023-07-17 | Disposition: A | Discharge status: Home / Self Care | Source: Ambulatory Visit | Attending: Internal Medicine | Admitting: Internal Medicine

## 2023-07-17 ENCOUNTER — Ambulatory Visit: Admitting: Medical

## 2023-07-17 ENCOUNTER — Encounter (HOSPITAL_COMMUNITY): Payer: Self-pay

## 2023-07-17 VITALS — BP 120/74 | HR 84 | Ht 63.0 in | Wt 110.0 lb

## 2023-07-17 DIAGNOSIS — E782 Mixed hyperlipidemia: Secondary | ICD-10-CM | POA: Insufficient documentation

## 2023-07-17 DIAGNOSIS — I6523 Occlusion and stenosis of bilateral carotid arteries: Secondary | ICD-10-CM | POA: Diagnosis not present

## 2023-07-17 DIAGNOSIS — Z09 Encounter for follow-up examination after completed treatment for conditions other than malignant neoplasm: Secondary | ICD-10-CM

## 2023-07-17 DIAGNOSIS — I2510 Atherosclerotic heart disease of native coronary artery without angina pectoris: Secondary | ICD-10-CM | POA: Insufficient documentation

## 2023-07-17 DIAGNOSIS — I10 Essential (primary) hypertension: Secondary | ICD-10-CM

## 2023-07-17 DIAGNOSIS — Z1231 Encounter for screening mammogram for malignant neoplasm of breast: Secondary | ICD-10-CM | POA: Diagnosis not present

## 2023-07-17 MED ORDER — CARVEDILOL 3.125 MG PO TABS: 3.1250 mg | ORAL_TABLET | Freq: Two times a day (BID) | ORAL | 3 refills | Status: DC

## 2023-07-17 NOTE — Patient Instructions (Signed)
 Medication Instructions:  Your physician recommends that you continue on your current medications as directed. Please refer to the Current Medication list given to you today.  *If you need a refill on your cardiac medications before your next appointment, please call your pharmacy*  Lab Work: NONE   If you have labs (blood work) drawn today and your tests are completely normal, you will receive your results only by: MyChart Message (if you have MyChart) OR A paper copy in the mail If you have any lab test that is abnormal or we need to change your treatment, we will call you to review the results.  Testing/Procedures: NONE   Follow-Up: At [facility], you and your health needs are our priority.  As part of our continuing mission to provide you with exceptional heart care, our providers are all part of one team.  This team includes your primary Cardiologist (physician) and Advanced Practice Providers or APPs (Physician Assistants and Nurse Practitioners) who all work together to provide you with the care you need, when you need it.  Your next appointment:   6 month(s)  Provider:   You may see Belinda P Mallipeddi, MD or one of the following Advanced Practice Providers on your designated Care Team:   [place], PA-C  Scotesia Heflin, [place] Theotis Flake, [place]     We recommend signing up for the patient portal called "MyChart".  Sign up information is provided on this After Visit Summary.  MyChart is used to connect with patients for Virtual Visits (Telemedicine).  Patients are able to view lab/test results, encounter notes, upcoming appointments, etc.  Non-urgent messages can be sent to your provider as well.   To learn more about what you can do with MyChart, go to [email or web address].   Other Instructions Thank you for choosing [facility]!

## 2023-07-17 NOTE — Progress Notes (Signed)
  Cardiology Office Note:  .   Date:  07/17/2023  ID:  Belinda Clark, DOB 08-15-1940, MRN [id number] PCP: Kathyleen Parkins, MD  [facility] Providers Cardiologist:  Lasalle Pointer, MD {   History of Present Illness: .   Belinda Clark is a 83 y.o. female with a h/o carotid artery stenosis s/p left carotid endarterectomy with residual 1-39% bilateral carotid artery stenosis  in 2019 followed by vascular surgery, elevated coronary calcium  score 1400 in 2014 (nuclear stress test was normal), HTN, HLD who presents for follow-up.   The patient had a spine fracture 03/2022 and underwent kyphoplasty in 05/2022 and she had elevated BP and amlodipine  was increased.   She was seen in the office 11/2022 reporting low pressures. It was recommended she take amlodipine  5mg  and metoprolol  50mg  daily.   Today, the patient reports Bps have been labile but she does not take her medications or BP at the same time daily. She feels tired, but wonders if this is just from age. She tries to stay active and do things around the house. Bps from home are overall are normal.  She denies chest pain, SOB, lightheadedness, dizziness, palpitations. She has occasional LLE. She is taking amlodipine  and Coreg   Studies Reviewed: .        Carotid US  06/2022 Summary:  Right Carotid: Velocities in the right ICA are consistent with a 1-39%  stenosis.                The ECA appears >50% stenosed.   Left Carotid: Velocities in the left ICA are consistent with a 1-39%  stenosis.   Vertebrals: Right vertebral artery demonstrates antegrade flow. Left  vertebral              artery demonstrates early systolic deceleration.  Subclavians: Bilateral subclavian arteries were stenotic.   *See table(s) above for measurements and observations.         Physical Exam:   VS:  BP 120/74 (BP Location: Left Arm, Cuff Size: Normal)   Pulse 84   Ht 5\' 3"  (1.6 m)   Wt 110 lb (49.9 kg)   SpO2 96%   BMI 19.49 kg/m    Wt  Readings from Last 3 Encounters:  07/17/23 110 lb (49.9 kg)  01/09/23 103 lb (46.7 kg)  12/12/22 106 lb (48.1 kg)    GEN: Well nourished, well developed in no acute distress NECK: No JVD; No carotid bruits CARDIAC: RRR, no murmurs, rubs, gallops RESPIRATORY:  Clear to auscultation without rales, wheezing or rhonchi  ABDOMEN: Soft, non-tender, non-distended EXTREMITIES:  No edema; No deformity   ASSESSMENT AND PLAN: .    HTN Patient reports labile blood pressures at home, but BP log mostly shows normal blood pressures. BP today is 120/74. Continue Amlodipine  10mg  daily and Coreg  3.125mg BID. recommended she take BP 2 hours after she takes her medications.  Elevated coronary calcium  score The patient denies anginal symptoms. Continue ASA 81mg  daily and Lipitor 40mg  daily She remains active at home. Feels more tired than normal, but feels this may be from age.   Carotid artery stenosis s/p L CEA with residual bilateral CAD Carotid US  in 2024 showed bilateral 1-39% stenosis. This is followed by vascular. Continue ASA and statin therapy.  HLD LDL 67. Continue Lipitor 40mg  daily.         Dispo: follow-up in 6 months  Signed, Kashlynn Kundert Rebekah [place], PA-C

## 2023-07-17 NOTE — Progress Notes (Signed)
 Attempted postoperative telephone visit with patient.  Left message for patient to call my office should she have any problems.

## 2023-07-20 ENCOUNTER — Telehealth: Payer: Self-pay | Admitting: Physician Assistant

## 2023-07-20 DIAGNOSIS — I10 Essential (primary) hypertension: Secondary | ICD-10-CM

## 2023-07-20 MED ORDER — AMLODIPINE BESYLATE 10 MG PO TABS: 10.0000 mg | ORAL_TABLET | Freq: Every day | ORAL | 0 refills | Status: DC

## 2023-07-20 MED ORDER — CARVEDILOL 3.125 MG PO TABS: 3.1250 mg | ORAL_TABLET | Freq: Two times a day (BID) | ORAL | 0 refills | Status: AC

## 2023-07-20 NOTE — Telephone Encounter (Signed)
 Patient contacted answering service needing short supply of refill for amlodipine  10 mg and carvedilol  3.125 mg twice daily.  [place] 6-[place] supply of these medications.  She request a be called into [facility] in [place].  Medications sent in at her request.

## 2023-08-26 ENCOUNTER — Ambulatory Visit: Admitting: General Surgery

## 2023-08-26 ENCOUNTER — Encounter: Payer: Self-pay | Admitting: General Surgery

## 2023-08-26 VITALS — BP 125/80 | HR 84 | Temp 98.6°F | Resp 14 | Ht 63.0 in | Wt 110.0 lb

## 2023-08-26 DIAGNOSIS — I10 Essential (primary) hypertension: Secondary | ICD-10-CM | POA: Diagnosis not present

## 2023-08-26 DIAGNOSIS — G894 Chronic pain syndrome: Secondary | ICD-10-CM | POA: Diagnosis not present

## 2023-08-26 DIAGNOSIS — E039 Hypothyroidism, unspecified: Secondary | ICD-10-CM | POA: Diagnosis not present

## 2023-08-26 DIAGNOSIS — K4020 Bilateral inguinal hernia, without obstruction or gangrene, not specified as recurrent: Secondary | ICD-10-CM

## 2023-08-26 DIAGNOSIS — F5101 Primary insomnia: Secondary | ICD-10-CM | POA: Diagnosis not present

## 2023-08-26 DIAGNOSIS — Z9229 Personal history of other drug therapy: Secondary | ICD-10-CM | POA: Diagnosis not present

## 2023-08-26 DIAGNOSIS — Z681 Body mass index (BMI) 19 or less, adult: Secondary | ICD-10-CM | POA: Diagnosis not present

## 2023-08-26 DIAGNOSIS — M5136 Other intervertebral disc degeneration, lumbar region with discogenic back pain only: Secondary | ICD-10-CM | POA: Diagnosis not present

## 2023-08-26 DIAGNOSIS — E063 Autoimmune thyroiditis: Secondary | ICD-10-CM | POA: Diagnosis not present

## 2023-08-26 DIAGNOSIS — M8000XA Age-related osteoporosis with current pathological fracture, unspecified site, initial encounter for fracture: Secondary | ICD-10-CM | POA: Diagnosis not present

## 2023-08-26 DIAGNOSIS — R609 Edema, unspecified: Secondary | ICD-10-CM | POA: Diagnosis not present

## 2023-08-26 DIAGNOSIS — M5414 Radiculopathy, thoracic region: Secondary | ICD-10-CM | POA: Diagnosis not present

## 2023-08-27 NOTE — Progress Notes (Signed)
 Belinda Clark; [id number]; 09/21/1940   HPI Patient is an 83 year old white female who returns to my care for evaluation of her right inguinal hernia.  Since I last saw her, she has developed a left inguinal hernia which is small.  She states that she has intermittent right groin pain, but she has never had an episode of incarceration.  It occasionally affects her daily routine.  She denies any nausea or vomiting.  She is hesitant on whether she wants surgical intervention. Past Medical History:  Diagnosis Date   Anxiety    Arthritis    Asthma    Carotid artery stenosis    40-59 % -Left   Elevated coronary artery calcium  score 10/07/2012   Negative stress echo 2016   GERD (gastroesophageal reflux disease)    Glaucoma    High blood pressure    HTN (hypertension)    Hyperlipidemia    Hypothyroidism    PONV (postoperative nausea and vomiting)    also sore throat after back surgery    Past Surgical History:  Procedure Laterality Date   BREAST BIOPSY     x3 left   CATARACT EXTRACTION W/ INTRAOCULAR LENS  IMPLANT, BILATERAL     COLONOSCOPY N/A 10/05/2013   Procedure: COLONOSCOPY;  Surgeon: Oneil DELENA Budge, MD;  Location: [facility];  Service: Gastroenterology;  Laterality: N/A;   ENDARTERECTOMY Left 05/19/2017   Procedure: ENDARTERECTOMY CAROTID LEFT;  Surgeon: Oris Krystal FALCON, MD;  Location: [facility] OR;  Service: Vascular;  Laterality: Left;   FRACTURE SURGERY     Spinal Fx 06/30/11- pt denies   KYPHOPLASTY N/A 06/03/2022   Procedure: KYPHOPLASTY, Thoracic Six, Thoracic Seven, Thoracic Eight;  Surgeon: Budge Purchase, MD;  Location: [facility] OR;  Service: Neurosurgery;  Laterality: N/A;   NECK MASS EXCISION     PATCH ANGIOPLASTY Left 05/19/2017   Procedure: PATCH ANGIOPLASTY LEFT CAROTID ARTERY USING HEMASHIELD PLATINUM FINESSE PATCH;  Surgeon: Oris Krystal FALCON, MD;  Location: [facility] OR;  Service: Vascular;  Laterality: Left;   RHINOPLASTY     3 total surgeries   SPINE SURGERY  06/30/2011   pt  states surgery was 2002    THYROIDECTOMY     TONSILLECTOMY      Family History  Problem Relation Age of Onset   Diabetes Mother    Hypertension Mother    Hyperlipidemia Mother    Heart attack Father    Heart disease Father    Hyperlipidemia Father    Heart disease Other    Diabetes Other    Stroke Brother    Heart disease Brother    Heart attack Brother    Hyperlipidemia Brother    Stroke Maternal Grandmother     Current Outpatient Medications on File Prior to Visit  Medication Sig Dispense Refill   Abaloparatide (TYMLOS) 3120 MCG/1.56ML SOPN Inject 80 mcg into the skin daily at 12 noon.     amLODipine  (NORVASC ) 10 MG tablet Take 1 tablet (10 mg total) by mouth daily. 30 tablet 0   Ascorbic Acid  (VITAMIN C ) 1000 MG tablet Take 1,000 mg by mouth at bedtime.     aspirin  EC 81 MG tablet Take 1 tablet (81 mg total) by mouth daily. Swallow whole. 90 tablet 3   atorvastatin  (LIPITOR) 40 MG tablet Take 40 mg by mouth every evening.     calcium  carbonate (TUMS - DOSED IN MG ELEMENTAL CALCIUM ) 500 MG chewable tablet Chew 1 tablet by mouth daily as needed for indigestion or heartburn.  Calcium  Citrate-Vitamin D  (CALCIUM  CITRATE + D PO) Take 1 tablet by mouth in the morning and at bedtime.     carvedilol  (COREG ) 3.125 MG tablet Take 1 tablet (3.125 mg total) by mouth 2 (two) times daily. 60 tablet 0   Cholecalciferol  25 MCG (1000 UT) tablet Take 1,000 Units by mouth at bedtime.     fluticasone (CUTIVATE) 0.05 % cream Apply 1 Application topically 2 (two) times daily as needed.     folic acid  (FOLVITE ) 800 MCG tablet Take 800 mcg by mouth daily.     Garlic 1000 MG CAPS Take 1,000 mg by mouth at bedtime.     ibuprofen (ADVIL,MOTRIN) 200 MG tablet Take 400 mg by mouth every 4 (four) hours as needed for pain.      LORazepam  (ATIVAN ) 0.5 MG tablet Take 0.5 mg by mouth 3 (three) times daily as needed for sleep.      montelukast  (SINGULAIR ) 10 MG tablet Take 10 mg by mouth at bedtime.      Multiple Vitamin (MULTIVITAMIN) capsule Take 1 capsule by mouth daily.     Omega 3 1000 MG CAPS Take 1,000 mg by mouth at bedtime.     omeprazole (PRILOSEC) 20 MG capsule Take 20 mg by mouth daily.     PROAIR  HFA 108 (90 BASE) MCG/ACT inhaler Inhale 2 puffs into the lungs every 6 (six) hours as needed for wheezing or shortness of breath.      pyridOXINE  (VITAMIN B-6) 100 MG tablet Take 100 mg by mouth daily.     SYNTHROID  112 MCG tablet Take 100 mcg by mouth daily before breakfast.     vitamin B-12 (CYANOCOBALAMIN ) 500 MCG tablet Take 500 mcg by mouth at bedtime.     vitamin E 400 UNIT capsule Take 400 Units by mouth at bedtime.     hydrOXYzine (ATARAX) 25 MG tablet Take 25 mg by mouth every 6 (six) hours as needed. (Patient not taking: Reported on 08/26/2023)     magnesium  hydroxide (MILK OF MAGNESIA) 400 MG/5ML suspension Take 5 mLs by mouth daily as needed. (Patient not taking: Reported on 08/26/2023)     meloxicam (MOBIC) 7.5 MG tablet Take 7.5 mg by mouth 2 (two) times daily. (Patient not taking: Reported on 08/26/2023)     No current facility-administered medications on file prior to visit.    Allergies  Allergen Reactions   Ciprofloxacin Hives   Clindamycin Hcl Hives   Iodinated Contrast Media Hives    Iohexol    Sulfa Antibiotics Hives   Ceftin  [Cefuroxime] Hives   Clarithromycin    Codeine    Cymbalta [Duloxetine Hcl]    Doxycycline      Pt states that this is not an allergy   Iohexol  Hives   Levofloxacin    Metoclopramide Hcl    Penicillin G    Penicillins     Social History   Substance and Sexual Activity  Alcohol Use No    Social History   Tobacco Use  Smoking Status Former   Current packs/day: 0.00   Average packs/day: 1 pack/day for 30.0 years (30.0 ttl pk-yrs)   Types: Cigarettes   Start date: 12/27/1963   Quit date: 12/26/1993   Years since quitting: 29.6  Smokeless Tobacco Never    Review of Systems  Constitutional: Negative.   HENT: Negative.    Eyes:  Negative.   Respiratory: Negative.    Cardiovascular: Negative.   Gastrointestinal:  Positive for abdominal pain.  Genitourinary: Negative.   Musculoskeletal:  Positive for back pain.  Skin: Negative.   Neurological: Negative.   Endo/Heme/Allergies: Negative.   Psychiatric/Behavioral: Negative.      Objective   Vitals:   08/26/23 1405  BP: 125/80  Pulse: 84  Resp: 14  Temp: 98.6 F (37 C)  SpO2: 94%    Physical Exam Vitals reviewed.  Constitutional:      Appearance: Normal appearance. She is normal weight. She is not ill-appearing.  HENT:     Head: Normocephalic and atraumatic.  Cardiovascular:     Rate and Rhythm: Normal rate and regular rhythm.     Heart sounds: Normal heart sounds. No murmur heard.    No friction rub. No gallop.  Pulmonary:     Effort: Pulmonary effort is normal. No respiratory distress.     Breath sounds: Normal breath sounds. No stridor. No wheezing, rhonchi or rales.  Abdominal:     General: Bowel sounds are normal. There is no distension.     Palpations: Abdomen is soft. There is no mass.     Tenderness: There is no abdominal tenderness. There is no guarding or rebound.     Hernia: A hernia is present.     Comments: Bilateral inguinal hernias, right greater than left.  Skin:    General: Skin is warm and dry.  Neurological:     Mental Status: She is alert and oriented to person, place, and time.     Assessment  Bilateral inguinal hernias, right greater than left Plan  I explained to the patient what was involved with a robotic assisted bilateral inguinal herniorrhaphies with mesh.  The risks and benefits of the procedure were fully explained to the patient.  She is still very hesitant on undergoing surgery.  I told her she could contact me with any questions should she decide to proceed with surgical intervention.  Follow-up here as needed.

## 2023-09-04 DIAGNOSIS — E890 Postprocedural hypothyroidism: Secondary | ICD-10-CM | POA: Diagnosis not present

## 2023-09-19 DIAGNOSIS — M5136 Other intervertebral disc degeneration, lumbar region with discogenic back pain only: Secondary | ICD-10-CM | POA: Diagnosis not present

## 2023-09-19 DIAGNOSIS — J449 Chronic obstructive pulmonary disease, unspecified: Secondary | ICD-10-CM | POA: Diagnosis not present

## 2023-09-19 DIAGNOSIS — Z681 Body mass index (BMI) 19 or less, adult: Secondary | ICD-10-CM | POA: Diagnosis not present

## 2023-09-19 DIAGNOSIS — M47812 Spondylosis without myelopathy or radiculopathy, cervical region: Secondary | ICD-10-CM | POA: Diagnosis not present

## 2023-09-19 DIAGNOSIS — I10 Essential (primary) hypertension: Secondary | ICD-10-CM | POA: Diagnosis not present

## 2023-10-01 ENCOUNTER — Other Ambulatory Visit: Payer: Self-pay

## 2023-10-01 DIAGNOSIS — I6529 Occlusion and stenosis of unspecified carotid artery: Secondary | ICD-10-CM

## 2023-10-17 ENCOUNTER — Encounter: Payer: Self-pay | Admitting: Radiology

## 2023-10-29 DIAGNOSIS — Z681 Body mass index (BMI) 19 or less, adult: Secondary | ICD-10-CM | POA: Diagnosis not present

## 2023-10-29 DIAGNOSIS — M5136 Other intervertebral disc degeneration, lumbar region with discogenic back pain only: Secondary | ICD-10-CM | POA: Diagnosis not present

## 2023-10-29 DIAGNOSIS — I10 Essential (primary) hypertension: Secondary | ICD-10-CM | POA: Diagnosis not present

## 2023-10-29 DIAGNOSIS — Z9229 Personal history of other drug therapy: Secondary | ICD-10-CM | POA: Diagnosis not present

## 2023-11-06 ENCOUNTER — Ambulatory Visit (INDEPENDENT_AMBULATORY_CARE_PROVIDER_SITE_OTHER): Admitting: Vascular Surgery

## 2023-11-06 ENCOUNTER — Encounter: Payer: Self-pay | Admitting: Vascular Surgery

## 2023-11-06 ENCOUNTER — Ambulatory Visit (HOSPITAL_COMMUNITY)
Admission: RE | Admit: 2023-11-06 | Discharge: 2023-11-06 | Disposition: A | Discharge status: Home / Self Care | Source: Ambulatory Visit | Attending: Vascular Surgery | Admitting: Vascular Surgery

## 2023-11-06 VITALS — BP 137/83 | HR 80 | Temp 97.8°F | Resp 20 | Ht 63.0 in | Wt 111.0 lb

## 2023-11-06 DIAGNOSIS — I6529 Occlusion and stenosis of unspecified carotid artery: Secondary | ICD-10-CM | POA: Diagnosis not present

## 2023-11-06 DIAGNOSIS — I771 Stricture of artery: Secondary | ICD-10-CM

## 2023-11-06 NOTE — Progress Notes (Signed)
 [facility] [place]  Patient name: Belinda Clark MRN: [id number] DOB: 09-09-1940 Sex: female  REASON FOR VISIT: Follow-up known carotid disease.  HPI: Belinda Clark is a 83 y.o. female here today for follow-up of carotid disease.  She underwent left carotid endarterectomy in March 2019 for severe asymptomatic disease.    On exam today, [name] was doing well.  She has had no issues since last seen one year ago.  They are working to sell their home in [place] as well as 1 in [place] with plans to move to [facility].  She denies any amaurosis fugax, transient ischemic attack or stroke.       Past Medical History:  Diagnosis Date   Anxiety    Arthritis    Asthma    Carotid artery stenosis    40-59 % -Left   Elevated coronary artery calcium  score 10/07/2012   Negative stress echo 2016   GERD (gastroesophageal reflux disease)    Glaucoma    High blood pressure    HTN (hypertension)    Hyperlipidemia    Hypothyroidism    PONV (postoperative nausea and vomiting)    also sore throat after back surgery    Family History  Problem Relation Age of Onset   Diabetes Mother    Hypertension Mother    Hyperlipidemia Mother    Heart attack Father    Heart disease Father    Hyperlipidemia Father    Heart disease Other    Diabetes Other    Stroke Brother    Heart disease Brother    Heart attack Brother    Hyperlipidemia Brother    Stroke Maternal Grandmother     SOCIAL HISTORY: Social History   Tobacco Use   Smoking status: Former    Current packs/day: 0.00    Average packs/day: 1 pack/day for 30.0 years (30.0 ttl pk-yrs)    Types: Cigarettes    Start date: 12/27/1963    Quit date: 12/26/1993    Years since quitting: 29.8   Smokeless tobacco: Never  Substance Use Topics   Alcohol use: No    Allergies  Allergen Reactions   Ciprofloxacin Hives   Clindamycin Hcl Hives   Iodinated Contrast Media  Hives    Iohexol    Sulfa Antibiotics Hives   Ceftin  [Cefuroxime] Hives   Clarithromycin    Codeine    Cymbalta [Duloxetine Hcl]    Doxycycline      Pt states that this is not an allergy   Iohexol  Hives   Levofloxacin    Metoclopramide Hcl    Penicillin G    Penicillins     Current Outpatient Medications  Medication Sig Dispense Refill   Abaloparatide (TYMLOS) 3120 MCG/1.56ML SOPN Inject 80 mcg into the skin daily at 12 noon.     amLODipine  (NORVASC ) 10 MG tablet Take 1 tablet (10 mg total) by mouth daily. 30 tablet 0   Ascorbic Acid  (VITAMIN C ) 1000 MG tablet Take 1,000 mg by mouth at bedtime.     aspirin  EC 81 MG tablet Take 1 tablet (81 mg total) by mouth daily. Swallow whole. 90 tablet 3   atorvastatin  (LIPITOR) 40 MG tablet Take 40 mg by mouth every evening.     calcium  carbonate (TUMS - DOSED IN MG ELEMENTAL CALCIUM ) 500 MG chewable tablet Chew 1 tablet by mouth daily as needed for indigestion or heartburn.     Calcium  Citrate-Vitamin D  (CALCIUM  CITRATE + D PO) Take 1 tablet  by mouth in the morning and at bedtime.     carvedilol  (COREG ) 3.125 MG tablet Take 1 tablet (3.125 mg total) by mouth 2 (two) times daily. 60 tablet 0   Cholecalciferol  25 MCG (1000 UT) tablet Take 1,000 Units by mouth at bedtime.     fluticasone (CUTIVATE) 0.05 % cream Apply 1 Application topically 2 (two) times daily as needed.     folic acid  (FOLVITE ) 800 MCG tablet Take 800 mcg by mouth daily.     Garlic 1000 MG CAPS Take 1,000 mg by mouth at bedtime.     hydrOXYzine (ATARAX) 25 MG tablet Take 25 mg by mouth every 6 (six) hours as needed. (Patient not taking: Reported on 08/26/2023)     ibuprofen (ADVIL,MOTRIN) 200 MG tablet Take 400 mg by mouth every 4 (four) hours as needed for pain.      LORazepam  (ATIVAN ) 0.5 MG tablet Take 0.5 mg by mouth 3 (three) times daily as needed for sleep.      magnesium  hydroxide (MILK OF MAGNESIA) 400 MG/5ML suspension Take 5 mLs by mouth daily as needed. (Patient not  taking: Reported on 08/26/2023)     meloxicam (MOBIC) 7.5 MG tablet Take 7.5 mg by mouth 2 (two) times daily. (Patient not taking: Reported on 08/26/2023)     montelukast  (SINGULAIR ) 10 MG tablet Take 10 mg by mouth at bedtime.     Multiple Vitamin (MULTIVITAMIN) capsule Take 1 capsule by mouth daily.     Omega 3 1000 MG CAPS Take 1,000 mg by mouth at bedtime.     omeprazole (PRILOSEC) 20 MG capsule Take 20 mg by mouth daily.     PROAIR  HFA 108 (90 BASE) MCG/ACT inhaler Inhale 2 puffs into the lungs every 6 (six) hours as needed for wheezing or shortness of breath.      pyridOXINE  (VITAMIN B-6) 100 MG tablet Take 100 mg by mouth daily.     SYNTHROID  112 MCG tablet Take 100 mcg by mouth daily before breakfast.     vitamin B-12 (CYANOCOBALAMIN ) 500 MCG tablet Take 500 mcg by mouth at bedtime.     vitamin E 400 UNIT capsule Take 400 Units by mouth at bedtime.     No current facility-administered medications for this visit.    REVIEW OF SYSTEMS:  [X]  denotes positive finding, [ ]  denotes negative finding Cardiac  Comments:  Chest pain or chest pressure:    Shortness of breath upon exertion:    Short of breath when lying flat:    Irregular heart rhythm:        Vascular    Pain in calf, thigh, or hip brought on by ambulation:    Pain in feet at night that wakes you up from your sleep:     Blood clot in your veins:    Leg swelling:           PHYSICAL EXAM: There were no vitals filed for this visit. GENERAL: The patient is a well-nourished female, in no acute distress. The vital signs are documented above. CARDIOVASCULAR: Well-healed left carotid incision with no carotid bruits.  2+ radial pulses bilaterally PULMONARY: There is good air exchange  MUSCULOSKELETAL: There are no major deformities or cyanosis. NEUROLOGIC: No focal weakness or paresthesias are detected. SKIN: There are no ulcers or rashes noted. PSYCHIATRIC: The patient has a normal affect.  DATA:   Summary:  Right  Carotid: Velocities in the right ICA are consistent with a 1-39%  stenosis.  The ECA appears >50% stenosed.   Left Carotid: The carotid endarterectomy site is well visualized  demonstrating               normal patency with no evidence of significant diameter  reduction.   Vertebrals: Right vertebral artery demonstrates antegrade flow. Left  vertebral              artery demonstrates bidirectional flow.  Subclavians: Right subclavian artery was stenotic. Bilateral subclavian  artery              flow was turbulent    MEDICAL ISSUES: Stable carotid disease.  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  She she knows to present immediately should she develop any neurologic deficits to the emergency department.    She is moving to [facility] full-time, and therefore I have sent a referral to [facility] for follow-up.   Fonda FORBES Rim MD

## 2023-11-24 ENCOUNTER — Telehealth: Payer: Self-pay | Admitting: Internal Medicine

## 2023-11-24 NOTE — Telephone Encounter (Signed)
 Patient tells me she had episode of leg swelling back in the summer of 2024 when it was hot. Resolved on it's own.  Now she has noted about a week now that she has feet and ankle swelling. Swelling is better in the am. No SOB,No DOE,No CP. She is very anxious about having heart or kidney failure.  Her current weight is 115 lbs. She states she uses not salt in her diet.  She has multiple BP readings over the past 2 days, 88/61,94/62,100/68,115/69,131/79,120/71   HR 78-83  Appointment made tomorrow 11 am with E.Peck,NP

## 2023-11-24 NOTE — Telephone Encounter (Signed)
 Pt c/o swelling/edema: STAT if pt has developed SOB within 24 hours  If swelling, where is the swelling located? Ankles and feet / Started a week or so ago   How much weight have you gained and in what time span? Na   Have you gained 2 pounds in a day or 5 pounds in a week? Na   Do you have a log of your daily weights (if so, list)? Na   Are you currently taking a fluid pill? No   Are you currently SOB? No   Have you traveled recently in a car or plane for an extended period of time? No   Best number [phone] [phone]

## 2023-11-24 NOTE — Telephone Encounter (Signed)
 Pt is calling again about her swelling. Pt audibly short of breath on the phone. Call transferred to triage.   Pt c/o swelling/edema: STAT if pt has developed SOB within 24 hours  If swelling, where is the swelling located? Legs and ankles   How much weight have you gained and in what time span? Not sure   Have you gained 2 pounds in a day or 5 pounds in a week? Not sure   Do you have a log of your daily weights (if so, list)? No   Are you currently taking a fluid pill? No   Are you currently SOB? Yes   Have you traveled recently in a car or plane for an extended period of time? No

## 2023-11-25 ENCOUNTER — Ambulatory Visit: Attending: Nurse Practitioner | Admitting: Nurse Practitioner

## 2023-11-25 ENCOUNTER — Encounter: Payer: Self-pay | Admitting: Nurse Practitioner

## 2023-11-25 VITALS — BP 116/80 | HR 80 | Ht 63.0 in | Wt 111.8 lb

## 2023-11-25 DIAGNOSIS — I2510 Atherosclerotic heart disease of native coronary artery without angina pectoris: Secondary | ICD-10-CM

## 2023-11-25 DIAGNOSIS — E785 Hyperlipidemia, unspecified: Secondary | ICD-10-CM | POA: Diagnosis not present

## 2023-11-25 DIAGNOSIS — I779 Disorder of arteries and arterioles, unspecified: Secondary | ICD-10-CM

## 2023-11-25 DIAGNOSIS — R4589 Other symptoms and signs involving emotional state: Secondary | ICD-10-CM

## 2023-11-25 DIAGNOSIS — M7989 Other specified soft tissue disorders: Secondary | ICD-10-CM

## 2023-11-25 DIAGNOSIS — I10 Essential (primary) hypertension: Secondary | ICD-10-CM

## 2023-11-25 MED ORDER — AMLODIPINE BESYLATE 5 MG PO TABS: 5.0000 mg | ORAL_TABLET | Freq: Every day | ORAL | 3 refills | Status: DC

## 2023-11-25 NOTE — Patient Instructions (Signed)
 Medication Instructions:  Your physician has recommended you make the following change in your medication:   -Change Amlodipine  to 5 mg once daily.   *If you need a refill on your cardiac medications before your next appointment, please call your pharmacy*  Lab Work: None If you have labs (blood work) drawn today and your tests are completely normal, you will receive your results only by: MyChart Message (if you have MyChart) OR A paper copy in the mail If you have any lab test that is abnormal or we need to change your treatment, we will call you to review the results.  Testing/Procedures: None  Follow-Up: At [facility], you and your health needs are our priority.  As part of our continuing mission to provide you with exceptional heart care, our providers are all part of one team.  This team includes your primary Cardiologist (physician) and Advanced Practice Providers or APPs (Physician Assistants and Nurse Practitioners) who all work together to provide you with the care you need, when you need it.  Your next appointment:   6 month(s)  Provider:   Almarie Crate, NP      We recommend signing up for the patient portal called MyChart.  Sign up information is provided on this After Visit Summary.  MyChart is used to connect with patients for Virtual Visits (Telemedicine).  Patients are able to view lab/test results, encounter notes, upcoming appointments, etc.  Non-urgent messages can be sent to your provider as well.   To learn more about what you can do with MyChart, go to [email or web address].   Other Instructions

## 2023-11-25 NOTE — Progress Notes (Unsigned)
 Cardiology Office Note   Date:  11/25/2023 ID:  WILMARY LEVIT, DOB 1940-05-07, MRN [id number] PCP: Bertell Satterfield, MD  [facility] Providers Cardiologist:  Diannah SHAUNNA Maywood, MD     History of Present Illness PUNAM BROUSSARD is a 83 y.o. female with a PMH of coronary artery calcification, carotid artery disease, s/p left carotid endarterectomy, history of vascular surgery, hypertension, hyperlipidemia, leg edema, who presents today for leg edema evaluation.  Last seen by Dr. Mallipeddi on Jul 17, 2023.  At that time, patient noted labile blood pressures.  Noted feeling fatigued, noted some occasional left lower extremity swelling.  Overall was doing well.  Today she presents for leg edema evaluation, however patient is very visiblly upset and emotional today and tearful as she says her fiance broke up with her this morning about her pets. She says she was completely blind sighted by this. Denies any SI/HI. Denies any chest pain, shortness of breath, palpitations, syncope, presyncope, dizziness, orthopnea, PND, swelling or significant weight changes, acute bleeding, or claudication.  She tells me she self decreased her Norvasc  to 5 mg daily that has helped resolve her leg swelling, did have some lower SBP readings prior to reducing and now that she has reduced her amlodipine , her SBP is within normal range.  ROS: Negative. See HPI.   Studies Reviewed  EKG:  EKG Interpretation Date/Time:  Tuesday November 25 2023 11:14:27 EDT Ventricular Rate:  86 PR Interval:  152 QRS Duration:  88 QT Interval:  338 QTC Calculation: 404 R Axis:   190  Text Interpretation: Normal sinus rhythm Right superior axis deviation Septal infarct (cited on or before 12-Dec-2022) When compared with ECG of 12-Dec-2022 13:43, QRS axis Shifted left T wave inversion now evident in Lateral leads Confirmed by Miriam Norris [phone]) on 11/25/2023 11:16:07 AM   Carotid duplex 10/2023:  Summary:  Right  Carotid: Velocities in the right ICA are consistent with a 1-39%  stenosis. The ECA appears >50% stenosed.   Left Carotid: The carotid endarterectomy site is well visualized  demonstrating normal patency with no evidence of significant diameter  reduction.   Vertebrals: Right vertebral artery demonstrates antegrade flow. Left  vertebral artery demonstrates bidirectional flow.  Subclavians: Right subclavian artery was stenotic. Bilateral subclavian  artery flow was turbulent   *See table(s) above for measurements and observations.    CT Calcium  Score 09/2012:  Total Score 1483.6.   Physical Exam VS:  BP 116/80   Pulse 80   Ht 5' 3 (1.6 m)   Wt 111 lb 12.8 oz (50.7 kg)   SpO2 96%   BMI 19.80 kg/m        Wt Readings from Last 3 Encounters:  11/25/23 111 lb 12.8 oz (50.7 kg)  11/06/23 111 lb (50.3 kg)  08/26/23 110 lb (49.9 kg)    GEN: Well nourished, well developed in no acute distress, tearful and upset NECK: No JVD; No carotid bruits CARDIAC: S1/S2, RRR, no murmurs, rubs, gallops RESPIRATORY:  Clear to auscultation without rales, wheezing or rhonchi  ABDOMEN: Soft, non-tender, non-distended EXTREMITIES:  No edema; No deformity   ASSESSMENT AND PLAN  Coronary artery calcification Stable with no anginal symptoms. No indication for ischemic evaluation.  Continue aspirin , atorvastatin , carvedilol . Heart healthy diet and regular cardiovascular exercise encouraged.   Carotid artery disease History of vascular surgery and status post left carotid endarterectomy in the past.  Doing well - denies any issues.  See most recent carotid duplex noted above.  No  medication changes at this time.Heart healthy diet and regular cardiovascular exercise encouraged.   Hypertension Blood pressure is stable. Discussed to monitor BP at home at least 2 hours after medications and sitting for 5-10 minutes.  Continue current medication regimen. Heart healthy diet and regular cardiovascular  exercise encouraged.   Hyperlipidemia LDL 67 from April 2025.  Continue atorvastatin .  This is being managed by her PCP. Heart healthy diet and regular cardiovascular exercise encouraged.   Leg edema This resolved after she self decreased her Norvasc  to 5 mg daily.  No medication changes at this time. Heart healthy diet and regular cardiovascular exercise encouraged.   6.  Emotional distress In the setting of break-up that happened in the morning before office visit.  Support given.  Denied any SI/HI.  Follow-up with PCP as scheduled.   I spent a total duration of 45 minutes reviewing prior notes, reviewing outside records including  labs, EKG today, face-to-face counseling of medical condition, pathophysiology, evaluation, management, and documenting the findings in the note.    Dispo: Follow-up with MD/APP in 6 months or sooner if anything changes.  Signed, Almarie Crate, NP

## 2023-11-27 ENCOUNTER — Ambulatory Visit
Admission: EM | Admit: 2023-11-27 | Discharge: 2023-11-27 | Disposition: A | Discharge status: Home / Self Care | Attending: Nurse Practitioner | Admitting: Nurse Practitioner

## 2023-11-27 DIAGNOSIS — J069 Acute upper respiratory infection, unspecified: Secondary | ICD-10-CM | POA: Diagnosis not present

## 2023-11-27 LAB — POC SOFIA SARS ANTIGEN FIA: SARS Coronavirus 2 Ag: NEGATIVE

## 2023-11-27 MED ORDER — CETIRIZINE HCL 10 MG PO TABS: 10.0000 mg | ORAL_TABLET | Freq: Every day | ORAL | 0 refills | Status: AC

## 2023-11-27 MED ORDER — BENZONATATE 100 MG PO CAPS: 100.0000 mg | ORAL_CAPSULE | Freq: Three times a day (TID) | ORAL | 0 refills | Status: AC | PRN

## 2023-11-27 MED ORDER — FLUTICASONE PROPIONATE 50 MCG/ACT NA SUSP: 2.0000 | Freq: Every day | NASAL | 0 refills | Status: AC

## 2023-11-27 NOTE — ED Triage Notes (Signed)
 Pt reports cough, congestion, sore throat, headache. x 2 days. Pt has tylenol  arthritis strength.

## 2023-11-27 NOTE — ED Provider Notes (Signed)
 [facility]    CSN: [id number] Arrival date & time: 11/27/23  9156      History   Chief Complaint No chief complaint on file.   HPI Belinda Clark is a 83 y.o. female.   The history is provided by the patient.   Patient presents with a 2-day history of cough, nasal congestion, sore throat, and headache.  She denies fever, chills, ear pain, ear drainage, wheezing, difficulty breathing, abdominal pain, nausea, vomiting, diarrhea, or rash.  Patient denies any obvious close sick contacts.  States she has been taking Tylenol  for her symptoms.  Patient also reports underlying history of seasonal allergies, she currently is not taking medications for her allergies.  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   Iatrogenic hyperthyroidism 04/06/2021   Primary hypertension 03/15/2021   Status post left carotid endarterectomy 03/15/2021   Degenerative scoliosis in adult patient 03/31/2019   Body mass index (BMI) 19.9 or less, adult 03/31/2019   Low back pain 03/31/2019   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   Lichen planus 03/29/2019   Compression fracture of lumbar spine, non-traumatic (HCC) 06/23/2018   Hypothyroidism 02/23/2018   Senile osteoporosis 07/07/2017   Carotid artery stenosis 05/19/2017   Lumbar herniated disc 03/12/2017   SOB (shortness of breath) 05/02/2014   Fullness of breast 02/14/2014    Hyperlipidemia 11/04/2012   Coronary artery disease involving native coronary artery 01/04/2011   PVD (peripheral vascular disease) 01/04/2011   ANKLE SPRAIN 04/30/2007    Past Surgical History:  Procedure Laterality Date   BREAST BIOPSY     x3 left   CATARACT EXTRACTION W/ INTRAOCULAR LENS  IMPLANT, BILATERAL     COLONOSCOPY N/A 10/05/2013   Procedure: COLONOSCOPY;  Surgeon: Oneil DELENA Budge, MD;  Location: [facility];  Service: Gastroenterology;  Laterality: N/A;   ENDARTERECTOMY Left 05/19/2017   Procedure: ENDARTERECTOMY CAROTID LEFT;  Surgeon: Oris Krystal FALCON, MD;  Location: [facility] OR;  Service: Vascular;  Laterality: Left;   FRACTURE SURGERY     Spinal Fx 06/30/11- pt denies   KYPHOPLASTY N/A 06/03/2022   Procedure: KYPHOPLASTY, Thoracic Six, Thoracic Seven, Thoracic Eight;  Surgeon: Budge Purchase, MD;  Location: [facility];  Service: Neurosurgery;  Laterality: N/A;   NECK MASS EXCISION     PATCH ANGIOPLASTY Left 05/19/2017   Procedure: PATCH ANGIOPLASTY LEFT CAROTID ARTERY USING HEMASHIELD PLATINUM FINESSE PATCH;  Surgeon: Oris Krystal FALCON, MD;  Location: [facility] OR;  Service: Vascular;  Laterality: Left;   RHINOPLASTY     3 total surgeries   SPINE SURGERY  06/30/2011   pt states surgery was 2002    THYROIDECTOMY     TONSILLECTOMY      OB History   No obstetric history on file.      Home Medications    Prior to Admission medications   Medication Sig Start Date  End Date Taking? Authorizing Provider  Abaloparatide (TYMLOS) 3120 MCG/1.56ML SOPN Inject 80 mcg into the skin daily at 12 noon. 07/16/22   [provider]  amLODipine  (NORVASC ) 5 MG tablet Take 1 tablet (5 mg total) by mouth daily. 11/25/23 11/19/24  Miriam Norris, NP  Ascorbic Acid  (VITAMIN C ) 1000 MG tablet Take 1,000 mg by mouth at bedtime.    [provider]  aspirin  EC 81 MG tablet Take 1 tablet (81 mg total) by mouth daily. Swallow whole. 06/05/22   Mallipeddi, Vishnu P, MD  atorvastatin  (LIPITOR) 40 MG  tablet Take 40 mg by mouth every evening.    [provider]  calcium  carbonate (TUMS - DOSED IN MG ELEMENTAL CALCIUM ) 500 MG chewable tablet Chew 1 tablet by mouth daily as needed for indigestion or heartburn.    [provider]  Calcium  Citrate-Vitamin D  (CALCIUM  CITRATE + D PO) Take 1 tablet by mouth in the morning and at bedtime.    [provider]  carvedilol  (COREG ) 3.125 MG tablet Take 1 tablet (3.125 mg total) by mouth 2 (two) times daily. 07/20/23 11/25/23  Abigail Bernardino HERO, PA-C  Cholecalciferol  25 MCG (1000 UT) tablet Take 1,000 Units by mouth at bedtime.    [provider]  fluticasone (CUTIVATE) 0.05 % cream Apply 1 Application topically 2 (two) times daily as needed. 10/08/22   [provider]  folic acid  (FOLVITE ) 800 MCG tablet Take 800 mcg by mouth daily.    [provider]  Garlic 1000 MG CAPS Take 1,000 mg by mouth at bedtime.    [provider]  hydrOXYzine (ATARAX) 25 MG tablet Take 25 mg by mouth every 6 (six) hours as needed. Patient not taking: Reported on 11/25/2023 07/25/22   [provider]  ibuprofen (ADVIL,MOTRIN) 200 MG tablet Take 400 mg by mouth every 4 (four) hours as needed for pain.     [provider]  LORazepam  (ATIVAN ) 0.5 MG tablet Take 0.5 mg by mouth 3 (three) times daily as needed for sleep.  09/20/11   [provider]  magnesium  hydroxide (MILK OF MAGNESIA) 400 MG/5ML suspension Take 5 mLs by mouth daily as needed. Patient not taking: Reported on 08/26/2023    [provider]  meloxicam (MOBIC) 7.5 MG tablet Take 7.5 mg by mouth 2 (two) times daily. Patient not taking: Reported on 08/26/2023 12/05/22   [provider]  montelukast  (SINGULAIR ) 10 MG tablet Take 10 mg by mouth at bedtime.    [provider]  Multiple Vitamin (MULTIVITAMIN) capsule Take 1 capsule by mouth daily.    [provider]  Omega 3 1000 MG CAPS Take 1,000 mg by mouth at  bedtime.    [provider]  omeprazole (PRILOSEC) 20 MG capsule Take 20 mg by mouth daily.    [provider]  PROAIR  HFA 108 (90 BASE) MCG/ACT inhaler Inhale 2 puffs into the lungs every 6 (six) hours as needed for wheezing or shortness of breath.  11/24/10   [provider]  pyridOXINE  (VITAMIN B-6) 100 MG tablet Take 100 mg by mouth daily.    [provider]  SYNTHROID  112 MCG tablet Take 100 mcg by mouth daily before breakfast. 12/28/21   [provider]  vitamin B-12 (CYANOCOBALAMIN ) 500 MCG tablet Take 500 mcg by mouth at bedtime.    [provider]  vitamin E 400 UNIT capsule Take 400 Units by mouth at bedtime.    [provider]    [facility]  History Family History  Problem Relation Age of Onset   Diabetes Mother    Hypertension Mother    Hyperlipidemia Mother    Heart attack Father    Heart disease Father    Hyperlipidemia Father    Heart disease Other    Diabetes Other    Stroke Brother    Heart disease Brother    Heart attack Brother    Hyperlipidemia Brother    Stroke Maternal Grandmother     Social History Social History   Tobacco Use   Smoking status: Former    Current packs/day: 0.00    Average packs/day: 1 pack/day for 30.0 years (30.0 ttl pk-yrs)    Types: Cigarettes    Start date: 12/27/1963    Quit date: 12/26/1993    Years since quitting: 29.9   Smokeless tobacco: Never  Vaping Use   Vaping status: Never Used  Substance Use Topics   Alcohol use: No   Drug use: No     Allergies   Ciprofloxacin, Clindamycin hcl, Iodinated contrast media, Sulfa antibiotics, Ceftin  [cefuroxime], Clarithromycin, Codeine, Cymbalta [duloxetine hcl], Doxycycline , Iohexol , Levofloxacin, Metoclopramide hcl, Penicillin g, and Penicillins   Review of Systems Review of Systems Per HPI  Physical Exam Triage Vital Signs ED Triage Vitals  Encounter Vitals Group     BP 11/27/23 0855 124/77     Girls Systolic BP  Percentile --      Girls Diastolic BP Percentile --      Boys Systolic BP Percentile --      Boys Diastolic BP Percentile --      Pulse Rate 11/27/23 0855 90     Resp 11/27/23 0855 18     Temp 11/27/23 0855 98.4 F (36.9 C)     Temp Source 11/27/23 0855 Oral     SpO2 11/27/23 0855 93 %     Weight --      Height --      Head Circumference --      Peak Flow --      Pain Score 11/27/23 0859 3     Pain Loc --      Pain Education --      Exclude from Growth Chart --    No data found.  Updated Vital Signs BP 124/77 (BP Location: Right Arm)   Pulse 90   Temp 98.4 F (36.9 C) (Oral)   Resp 18   SpO2 93%   Visual Acuity Right Eye Distance:   Left Eye Distance:   Bilateral Distance:    Right Eye Near:   Left Eye Near:    Bilateral Near:     Physical Exam Vitals and nursing note reviewed.  Constitutional:      General: She is not in acute distress.    Appearance: Normal appearance. She is well-developed.  HENT:     Head: Normocephalic and atraumatic.     Right Ear: Tympanic membrane, ear canal and external ear normal.     Left Ear: Tympanic membrane, ear canal and external ear normal.     Nose: Congestion present.     Right Turbinates: Enlarged and swollen.     Left Turbinates: Enlarged and swollen.     Right Sinus: No maxillary sinus tenderness or frontal sinus tenderness.     Left Sinus: No maxillary sinus tenderness or frontal sinus tenderness.     Mouth/Throat:     Lips: Pink.     Mouth: Mucous membranes are moist.     Pharynx: Uvula  midline. Posterior oropharyngeal erythema and postnasal drip present. No pharyngeal swelling, oropharyngeal exudate or uvula swelling.     Comments: Cobblestoning present to posterior oropharynx  Eyes:     Extraocular Movements: Extraocular movements intact.     Conjunctiva/sclera: Conjunctivae normal.     Pupils: Pupils are equal, round, and reactive to light.  Neck:     Thyroid : No thyromegaly.     Trachea: No tracheal deviation.   Cardiovascular:     Rate and Rhythm: Normal rate and regular rhythm.     Pulses: Normal pulses.     Heart sounds: Normal heart sounds.  Pulmonary:     Effort: Pulmonary effort is normal. No respiratory distress.     Breath sounds: Normal breath sounds. No stridor. No wheezing, rhonchi or rales.  Abdominal:     General: Bowel sounds are normal.     Palpations: Abdomen is soft.     Tenderness: There is no abdominal tenderness.  Musculoskeletal:     Cervical back: Normal range of motion and neck supple.  Skin:    General: Skin is warm and dry.  Neurological:     General: No focal deficit present.     Mental Status: She is alert and oriented to person, place, and time.  Psychiatric:        Mood and Affect: Mood normal.        Behavior: Behavior normal.        Thought Content: Thought content normal.        Judgment: Judgment normal.      UC Treatments / Results  Labs (all labs ordered are listed, but only abnormal results are displayed) Labs Reviewed  POC SOFIA SARS ANTIGEN FIA    EKG   Radiology No results found.  Procedures Procedures (including critical care time)  Medications Ordered in UC Medications - No data to display  Initial Impression / Assessment and Plan / UC Course  I have reviewed the triage vital signs and the nursing notes.  Pertinent labs & imaging results that were available during my care of the patient were reviewed by me and considered in my medical decision making (see chart for details).  COVID test is negative.  On exam, the patient's lung sounds are clear throughout, room air sats at 93%.  She is in no acute distress, vital signs are stable.  Symptoms consistent with viral URI with cough.  Will provide symptomatic treatment with cetirizine 10 mg, fluticasone 50 mcg nasal spray, and benzonatate 100 mg.  Supportive care recommendations were provided and discussed with the patient to include fluids, rest, over-the-counter Tylenol , normal saline  nasal spray, warm salt water gargles.  Discussed indications with patient regarding follow-up.  Patient was in agreement with this plan of care and verbalizes understanding.  All questions were answered.  Patient stable for discharge.  Final Clinical Impressions(s) / UC Diagnoses   Final diagnoses:  None   Discharge Instructions   None    ED Prescriptions   None    PDMP not reviewed this encounter.   Gilmer Etta PARAS, NP 11/27/23 [phone]

## 2023-11-27 NOTE — Discharge Instructions (Addendum)
 Your COVID test was negative. Take medication as prescribed. Increase fluids and allow for plenty of rest. You may take over-the-counter Tylenol  as needed for pain, fever, or general discomfort. Recommend normal saline nasal spray throughout the day for nasal congestion and runny nose. Recommend warm salt water gargles 3-4 times daily as needed for throat pain or discomfort. For your cough, you may use a humidifier in your bedroom at nighttime during sleep and sleeping elevated on pillows while cough symptoms persist. Symptoms should begin to improve within the next 5 to 7 days.  If symptoms fail to improve, or begin to worsen, you may follow-up in this clinic or with your primary care physician for further evaluation. Follow-up as needed.

## 2023-12-04 ENCOUNTER — Ambulatory Visit

## 2023-12-29 ENCOUNTER — Encounter: Payer: Self-pay | Admitting: Radiology

## 2024-02-05 ENCOUNTER — Encounter: Payer: Self-pay | Admitting: General Surgery

## 2024-02-05 ENCOUNTER — Ambulatory Visit: Admitting: General Surgery

## 2024-02-05 VITALS — BP 110/63 | HR 86 | Temp 97.9°F | Resp 14 | Ht 63.0 in | Wt 112.0 lb

## 2024-02-05 DIAGNOSIS — K4020 Bilateral inguinal hernia, without obstruction or gangrene, not specified as recurrent: Secondary | ICD-10-CM

## 2024-02-05 NOTE — Progress Notes (Signed)
 Subjective:     Belinda Clark  Patient presents back to my care for evaluation and treatment of her bilateral inguinal hernias.  She states that they are made worse with straining and is starting to cause her pain in both groins.  She would like to proceed with hernia repair. Objective:    BP 110/63   Pulse 86   Temp 97.9 F (36.6 C) (Oral)   Resp 14   Ht 5' 3 (1.6 m)   Wt 112 lb (50.8 kg)   SpO2 90%   BMI 19.84 kg/m   General:  alert, cooperative, and no distress  Lungs clear to auscultation with equal breath sounds bilaterally Heart examination reveals a regular rate and rhythm without S3, S4, murmurs Abdomen is soft and flat.  Bilateral inguinal hernias are present, easily reducible.  She does have tenderness over both internal rings.     Assessment:    Bilateral inguinal hernias, symptomatic Carotid artery disease, coronary artery disease    Plan:   Patient will be scheduled for a robotic assisted bilateral inguinal herniorrhaphy with mesh once she is cleared by cardiology.  She request that no Foley be placed and once in all female OR team.  The risks and benefits of the procedure including bleeding, infection, mesh use, and the possibility of recurrence of the hernias were fully explained to the patient, who gave informed consent.

## 2024-02-06 ENCOUNTER — Encounter: Payer: Self-pay | Admitting: *Deleted

## 2024-02-06 ENCOUNTER — Telehealth (HOSPITAL_BASED_OUTPATIENT_CLINIC_OR_DEPARTMENT_OTHER): Payer: Self-pay

## 2024-02-06 NOTE — Telephone Encounter (Signed)
° °  Pre-operative Risk Assessment    Patient Name: Belinda Clark  DOB: 1940/08/09 MRN: [id number]   Date of last office visit: 11/25/23 with Miriam Date of next office visit: 05/24/24 with Miriam  Request for Surgical Clearance    Procedure:  XI Robotic Assisted Laparoscopic Inguinal Hernia Repair W/ Mesh, Bilateral   Date of Surgery:  Clearance TBD                                 Surgeon:  Dr. Mavis Socks Group or Practice Name:  [facility] Phone number:  [phone] Fax number:  [phone]   Type of Clearance Requested:   - Medical  - Pharmacy:  Hold Aspirin  not included   Type of Anesthesia:  General    Additional requests/questions:    SignedAugustin JONETTA Daring   02/06/2024, 10:57 AM

## 2024-02-06 NOTE — Telephone Encounter (Signed)
° °  Name: Belinda Clark  DOB: [birth year]  MRN: [id number]  Primary Cardiologist: Vishnu P Mallipeddi, MD   Preoperative team, please contact this patient and set up a phone call appointment for further preoperative risk assessment. Please obtain consent and complete medication review. Thank you for your help.  I confirm that guidance regarding antiplatelet and oral anticoagulation therapy has been completed and, if necessary, noted below.  Ideally aspirin  should be continued without interruption, however if the bleeding risk is too great, aspirin  may be held for 5-7 days prior to surgery. Please resume aspirin  post operatively when it is felt to be safe from a bleeding standpoint.     I also confirmed the patient resides in the state of [place] . As per [facility] Medical Board telemedicine laws, the patient must reside in the state in which the provider is licensed.   Lamarr Satterfield, NP 02/06/2024, 11:10 AM [facility]

## 2024-02-06 NOTE — Telephone Encounter (Signed)
 1st attempt to reach pt regarding surgical clearance and the need for an TELE appointment.  Someone answered the phone and didn't say anything

## 2024-02-09 NOTE — Telephone Encounter (Signed)
2nd attempt to reach pt to schedule a tele pre op appt.  

## 2024-02-10 ENCOUNTER — Telehealth: Payer: Self-pay

## 2024-02-10 NOTE — Telephone Encounter (Signed)
 Appointment scheduled for 02/11/24 @ 1:40pm. Med req and consent are complete. Call patient at [phone]

## 2024-02-10 NOTE — Telephone Encounter (Signed)
°  Patient Consent for Virtual Visit         Belinda Clark has provided verbal consent on 02/10/2024 for a virtual visit (video or telephone).  Appointment scheduled for 02/11/24 @ 1:40pm Med req and consent are complete. Call patient at [phone]   CONSENT FOR VIRTUAL VISIT FOR:  Belinda Clark  By participating in this virtual visit I agree to the following:  I hereby voluntarily request, consent and authorize [facility] and its employed or contracted physicians, physician assistants, nurse practitioners or other licensed health care professionals (the Practitioner), to provide me with telemedicine health care services (the Services) as deemed necessary by the treating Practitioner. I acknowledge and consent to receive the Services by the Practitioner via telemedicine. I understand that the telemedicine visit will involve communicating with the Practitioner through live audiovisual communication technology and the disclosure of certain medical information by electronic transmission. I acknowledge that I have been given the opportunity to request an in-person assessment or other available alternative prior to the telemedicine visit and am voluntarily participating in the telemedicine visit.  I understand that I have the right to withhold or withdraw my consent to the use of telemedicine in the course of my care at any time, without affecting my right to future care or treatment, and that the Practitioner or I may terminate the telemedicine visit at any time. I understand that I have the right to inspect all information obtained and/or recorded in the course of the telemedicine visit and may receive copies of available information for a reasonable fee.  I understand that some of the potential risks of receiving the Services via telemedicine include:  Delay or interruption in medical evaluation due to technological equipment failure or disruption; Information transmitted may not  be sufficient (e.g. poor resolution of images) to allow for appropriate medical decision making by the Practitioner; and/or  In rare instances, security protocols could fail, causing a breach of personal health information.  Furthermore, I acknowledge that it is my responsibility to provide information about my medical history, conditions and care that is complete and accurate to the best of my ability. I acknowledge that Practitioner's advice, recommendations, and/or decision may be based on factors not within their control, such as incomplete or inaccurate data provided by me or distortions of diagnostic images or specimens that may result from electronic transmissions. I understand that the practice of medicine is not an exact science and that Practitioner makes no warranties or guarantees regarding treatment outcomes. I acknowledge that a copy of this consent can be made available to me via my patient portal [facility] MyChart), or I can request a printed copy by calling the office of [facility].    I understand that my insurance will be billed for this visit.   I have read or had this consent read to me. I understand the contents of this consent, which adequately explains the benefits and risks of the Services being provided via telemedicine.  I have been provided ample opportunity to ask questions regarding this consent and the Services and have had my questions answered to my satisfaction. I give my informed consent for the services to be provided through the use of telemedicine in my medical care

## 2024-02-11 ENCOUNTER — Ambulatory Visit: Attending: Cardiology | Admitting: Physician Assistant

## 2024-02-11 DIAGNOSIS — Z01810 Encounter for preprocedural cardiovascular examination: Secondary | ICD-10-CM

## 2024-02-11 MED ORDER — AMLODIPINE BESYLATE 5 MG PO TABS: 5.0000 mg | ORAL_TABLET | Freq: Every day | ORAL | 3 refills | Status: AC

## 2024-02-11 NOTE — Progress Notes (Signed)
 Virtual Visit via Telephone Note   Because of Belinda Clark co-morbid illnesses, she is at least at moderate risk for complications without adequate follow up.  This format is felt to be most appropriate for this patient at this time.  Due to technical limitations with video connection (technology), today's appointment will be conducted as an audio only telehealth visit, and Belinda Clark verbally agreed to proceed in this manner.   All issues noted in this document were discussed and addressed.  No physical exam could be performed with this format.  Evaluation Performed:  Preoperative cardiovascular risk assessment _____________   Date:  02/11/2024   Patient ID:  Belinda Clark, DOB [birth year], MRN [id number] Patient Location:  Home Provider location:   Office  Primary Care Provider:  Bertell Satterfield, MD Primary Cardiologist:  Vishnu P Mallipeddi, MD  Chief Complaint / Patient Profile   83 y.o. y/o female with a h/o coronary artery calcification, carotid artery disease, status post left carotid endarterectomy, history of vascular surgery, hypertension, hyperlipidemia, leg edema who is pending robotic assisted laparoscopic inguinal hernia repair with mesh, bilateral and presents today for telephonic preoperative cardiovascular risk assessment.  History of Present Illness    Belinda Clark is a 83 y.o. female who presents via audio/video conferencing for a telehealth visit today.  Pt was last seen in cardiology clinic on 11/25/2023 by Almarie Crate, NP.  At that time Belinda Clark was doing well outside of some social issues she was having.  The patient is now pending procedure as outlined above. Since her last visit, she is doing well and has not had any issues with her heart.  No chest pain, shortness of breath, or swelling.  She did have spinal surgery back in 2002 with spinal fractures again in 2024.  She has dealt with back issues since she has been in her 4s.  This limits  some of her activity.  She does meet minimum METS required for clearance.  Pastimes include quilting and card games.  Ideally aspirin  should be continued without interruption, however if the bleeding risk is too great, aspirin  may be held for 5-7 days prior to surgery. Please resume aspirin  post operatively when it is felt to be safe from a bleeding standpoint.   Past Medical History    Past Medical History:  Diagnosis Date   Anxiety    Arthritis    Asthma    Carotid artery stenosis    40-59 % -Left   Elevated coronary artery calcium  score 10/07/2012   Negative stress echo 2016   GERD (gastroesophageal reflux disease)    Glaucoma    High blood pressure    HTN (hypertension)    Hyperlipidemia    Hypothyroidism    PONV (postoperative nausea and vomiting)    also sore throat after back surgery   Past Surgical History:  Procedure Laterality Date   BREAST BIOPSY     x3 left   CATARACT EXTRACTION W/ INTRAOCULAR LENS  IMPLANT, BILATERAL     COLONOSCOPY N/A 10/05/2013   Procedure: COLONOSCOPY;  Surgeon: Oneil DELENA Budge, MD;  [facility];  Service: Gastroenterology;  Laterality: N/A;   ENDARTERECTOMY Left 05/19/2017   Procedure: ENDARTERECTOMY CAROTID LEFT;  Surgeon: Oris Krystal FALCON, MD;  Location: [facility] OR;  Service: Vascular;  Laterality: Left;   FRACTURE SURGERY     Spinal Fx 06/30/11- pt denies   KYPHOPLASTY N/A 06/03/2022   Procedure: KYPHOPLASTY, Thoracic Six, Thoracic Seven, Thoracic Eight;  Surgeon: Mavis Purchase, MD;  Location: [facility] OR;  Service: Neurosurgery;  Laterality: N/A;   NECK MASS EXCISION     PATCH ANGIOPLASTY Left 05/19/2017   Procedure: PATCH ANGIOPLASTY LEFT CAROTID ARTERY USING HEMASHIELD PLATINUM FINESSE PATCH;  Surgeon: Oris Krystal FALCON, MD;  Location: [facility] OR;  Service: Vascular;  Laterality: Left;   RHINOPLASTY     3 total surgeries   SPINE SURGERY  06/30/2011   pt states surgery was 2002    THYROIDECTOMY     TONSILLECTOMY       Allergies  Allergies[1]  Home Medications    Prior to Admission medications  Medication Sig Start Date End Date Taking? Authorizing Provider  Abaloparatide (TYMLOS) 3120 MCG/1.56ML SOPN Inject 80 mcg into the skin daily at 12 noon. 07/16/22   [provider]  amLODipine  (NORVASC ) 5 MG tablet Take 1 tablet (5 mg total) by mouth daily. 11/25/23 11/19/24  Miriam Norris, NP  Ascorbic Acid  (VITAMIN C ) 1000 MG tablet Take 1,000 mg by mouth at bedtime.    [provider]  aspirin  EC 81 MG tablet Take 1 tablet (81 mg total) by mouth daily. Swallow whole. 06/05/22   Mallipeddi, Vishnu P, MD  atorvastatin  (LIPITOR) 40 MG tablet Take 40 mg by mouth every evening.    [provider]  benzonatate  (TESSALON ) 100 MG capsule Take 1 capsule (100 mg total) by mouth 3 (three) times daily as needed for cough. 11/27/23   Leath-Warren, Etta PARAS, NP  calcium  carbonate (TUMS - DOSED IN MG ELEMENTAL CALCIUM ) 500 MG chewable tablet Chew 1 tablet by mouth daily as needed for indigestion or heartburn.    [provider]  Calcium  Citrate-Vitamin D  (CALCIUM  CITRATE + D PO) Take 1 tablet by mouth in the morning and at bedtime.    [provider]  carvedilol  (COREG ) 3.125 MG tablet Take 1 tablet (3.125 mg total) by mouth 2 (two) times daily. 07/20/23 02/05/24  Abigail Bernardino HERO, PA-C  cetirizine  (ZYRTEC ) 10 MG tablet Take 1 tablet (10 mg total) by mouth daily. 11/27/23   Leath-Warren, Etta PARAS, NP  Cholecalciferol  25 MCG (1000 UT) tablet Take 1,000 Units by mouth at bedtime.    [provider]  fluticasone  (CUTIVATE ) 0.05 % cream Apply 1 Application topically 2 (two) times daily as needed. 10/08/22   [provider]  fluticasone  (FLONASE ) 50 MCG/ACT nasal spray Place 2 sprays into both nostrils daily. 11/27/23   Leath-Warren, Etta PARAS, NP  folic acid  (FOLVITE ) 800 MCG tablet Take 800 mcg by mouth daily.    [provider]  Garlic 1000 MG CAPS Take 1,000  mg by mouth at bedtime.    [provider]  ibuprofen (ADVIL,MOTRIN) 200 MG tablet Take 400 mg by mouth every 4 (four) hours as needed for pain.     [provider]  LORazepam  (ATIVAN ) 0.5 MG tablet Take 0.5 mg by mouth 3 (three) times daily as needed for sleep.  09/20/11   [provider]  montelukast  (SINGULAIR ) 10 MG tablet Take 10 mg by mouth at bedtime.    [provider]  Multiple Vitamin (MULTIVITAMIN) capsule Take 1 capsule by mouth daily.    [provider]  Omega 3 1000 MG CAPS Take 1,000 mg by mouth at bedtime.    [provider]  omeprazole (PRILOSEC) 20 MG capsule Take 20 mg by mouth daily.    [provider]  PROAIR  HFA 108 (90 BASE) MCG/ACT inhaler Inhale 2 puffs into the lungs every 6 (  six) hours as needed for wheezing or shortness of breath.  11/24/10   [provider]  pyridOXINE  (VITAMIN B-6) 100 MG tablet Take 100 mg by mouth daily.    [provider]  SYNTHROID  112 MCG tablet Take 100 mcg by mouth daily before breakfast. 12/28/21   [provider]  vitamin B-12 (CYANOCOBALAMIN ) 500 MCG tablet Take 500 mcg by mouth at bedtime.    [provider]  vitamin E 400 UNIT capsule Take 400 Units by mouth at bedtime.    [provider]    Physical Exam    Vital Signs:  BRIYAH WHEELWRIGHT does not have vital signs available for review today.  Given telephonic nature of communication, physical exam is limited. AAOx3. NAD. Normal affect.  Speech and respirations are unlabored.  Accessory Clinical Findings    None  Assessment & Plan    1.  Preoperative Cardiovascular Risk Assessment:  Belinda Clark perioperative risk of a major cardiac event is 0.9% according to the Revised Cardiac Risk Index (RCRI).  Therefore, she is at low risk for perioperative complications.   Her functional capacity is good at 5.38 METs according to the [place] Activity Status Index  (DASI). Recommendations: According to ACC/AHA guidelines, no further cardiovascular testing needed.  The patient may proceed to surgery at acceptable risk.   Antiplatelet and/or Anticoagulation Recommendations: Aspirin  can be held for 5-7 days prior to her surgery.  Please resume Aspirin  post operatively when it is felt to be safe from a bleeding standpoint.   The patient was advised that if she develops new symptoms prior to surgery to contact our office to arrange for a follow-up visit, and she verbalized understanding.   A copy of this note will be routed to requesting surgeon.  Time:   Today, I have spent 8 minutes with the patient with telehealth technology discussing medical history, symptoms, and management plan.     Orren LOISE Fabry, PA-C  02/11/2024, 9:09 AM      [1]  Allergies Allergen Reactions   Ciprofloxacin Hives   Clindamycin Hcl Hives   Iodinated Contrast Media Hives    Iohexol    Sulfa Antibiotics Hives   Ceftin  [Cefuroxime] Hives   Clarithromycin    Codeine    Cymbalta [Duloxetine Hcl]    Doxycycline      Pt states that this is not an allergy   Iohexol  Hives   Levofloxacin    Metoclopramide Hcl    Penicillin G    Penicillins

## 2024-03-18 ENCOUNTER — Other Ambulatory Visit: Payer: Self-pay

## 2024-03-18 ENCOUNTER — Ambulatory Visit: Admitting: General Surgery

## 2024-03-18 ENCOUNTER — Encounter: Payer: Self-pay | Admitting: General Surgery

## 2024-03-18 VITALS — BP 140/82 | HR 90 | Temp 98.9°F | Resp 22 | Ht 63.0 in | Wt 111.8 lb

## 2024-03-18 DIAGNOSIS — K4020 Bilateral inguinal hernia, without obstruction or gangrene, not specified as recurrent: Secondary | ICD-10-CM

## 2024-03-18 NOTE — H&P (Signed)
 Belinda Clark; [id number]; [birth year]   HPI Patient is an 84 year old white female who returns to my care for evaluation of her right inguinal hernia.  Since I last saw her, she has developed a left inguinal hernia which is small.  She states that she has intermittent right groin pain, but she has never had an episode of incarceration.  It occasionally affects her daily routine.  She denies any nausea or vomiting.  She is hesitant on whether she wants surgical intervention. Past Medical History:  Diagnosis Date   Anxiety    Arthritis    Asthma    Carotid artery stenosis    40-59 % -Left   Elevated coronary artery calcium  score 10/07/2012   Negative stress echo 2016   GERD (gastroesophageal reflux disease)    Glaucoma    High blood pressure    HTN (hypertension)    Hyperlipidemia    Hypothyroidism    PONV (postoperative nausea and vomiting)    also sore throat after back surgery    Past Surgical History:  Procedure Laterality Date   BREAST BIOPSY     x3 left   CATARACT EXTRACTION W/ INTRAOCULAR LENS  IMPLANT, BILATERAL     COLONOSCOPY N/A 10/05/2013   Procedure: COLONOSCOPY;  Surgeon: Oneil DELENA Budge, MD;  Location: [facility];  Service: Gastroenterology;  Laterality: N/A;   ENDARTERECTOMY Left 05/19/2017   Procedure: ENDARTERECTOMY CAROTID LEFT;  Surgeon: Oris Krystal FALCON, MD;  Location: [facility] OR;  Service: Vascular;  Laterality: Left;   FRACTURE SURGERY     Spinal Fx 06/30/11- pt denies   KYPHOPLASTY N/A 06/03/2022   Procedure: KYPHOPLASTY, Thoracic Six, Thoracic Seven, Thoracic Eight;  Surgeon: Budge Purchase, MD;  Location: [facility] OR;  Service: Neurosurgery;  Laterality: N/A;   NECK MASS EXCISION     PATCH ANGIOPLASTY Left 05/19/2017   Procedure: PATCH ANGIOPLASTY LEFT CAROTID ARTERY USING HEMASHIELD PLATINUM FINESSE PATCH;  Surgeon: Oris Krystal FALCON, MD;  Location: [facility] OR;  Service: Vascular;  Laterality: Left;   RHINOPLASTY     3 total surgeries   SPINE SURGERY  06/30/2011   pt  states surgery was 2002    THYROIDECTOMY     TONSILLECTOMY      Family History  Problem Relation Age of Onset   Diabetes Mother    Hypertension Mother    Hyperlipidemia Mother    Heart attack Father    Heart disease Father    Hyperlipidemia Father    Heart disease Other    Diabetes Other    Stroke Brother    Heart disease Brother    Heart attack Brother    Hyperlipidemia Brother    Stroke Maternal Grandmother     Current Outpatient Medications on File Prior to Visit  Medication Sig Dispense Refill   Abaloparatide (TYMLOS) 3120 MCG/1.56ML SOPN Inject 80 mcg into the skin daily at 12 noon.     amLODipine  (NORVASC ) 10 MG tablet Take 1 tablet (10 mg total) by mouth daily. 30 tablet 0   Ascorbic Acid  (VITAMIN C ) 1000 MG tablet Take 1,000 mg by mouth at bedtime.     aspirin  EC 81 MG tablet Take 1 tablet (81 mg total) by mouth daily. Swallow whole. 90 tablet 3   atorvastatin  (LIPITOR) 40 MG tablet Take 40 mg by mouth every evening.     calcium  carbonate (TUMS - DOSED IN MG ELEMENTAL CALCIUM ) 500 MG chewable tablet Chew 1 tablet by mouth daily as needed for indigestion or heartburn.  Calcium  Citrate-Vitamin D  (CALCIUM  CITRATE + D PO) Take 1 tablet by mouth in the morning and at bedtime.     carvedilol  (COREG ) 3.125 MG tablet Take 1 tablet (3.125 mg total) by mouth 2 (two) times daily. 60 tablet 0   Cholecalciferol  25 MCG (1000 UT) tablet Take 1,000 Units by mouth at bedtime.     fluticasone  (CUTIVATE ) 0.05 % cream Apply 1 Application topically 2 (two) times daily as needed.     folic acid  (FOLVITE ) 800 MCG tablet Take 800 mcg by mouth daily.     Garlic 1000 MG CAPS Take 1,000 mg by mouth at bedtime.     ibuprofen (ADVIL,MOTRIN) 200 MG tablet Take 400 mg by mouth every 4 (four) hours as needed for pain.      LORazepam  (ATIVAN ) 0.5 MG tablet Take 0.5 mg by mouth 3 (three) times daily as needed for sleep.      montelukast  (SINGULAIR ) 10 MG tablet Take 10 mg by mouth at bedtime.      Multiple Vitamin (MULTIVITAMIN) capsule Take 1 capsule by mouth daily.     Omega 3 1000 MG CAPS Take 1,000 mg by mouth at bedtime.     omeprazole (PRILOSEC) 20 MG capsule Take 20 mg by mouth daily.     PROAIR  HFA 108 (90 BASE) MCG/ACT inhaler Inhale 2 puffs into the lungs every 6 (six) hours as needed for wheezing or shortness of breath.      pyridOXINE  (VITAMIN B-6) 100 MG tablet Take 100 mg by mouth daily.     SYNTHROID  112 MCG tablet Take 100 mcg by mouth daily before breakfast.     vitamin B-12 (CYANOCOBALAMIN ) 500 MCG tablet Take 500 mcg by mouth at bedtime.     vitamin E 400 UNIT capsule Take 400 Units by mouth at bedtime.     hydrOXYzine (ATARAX) 25 MG tablet Take 25 mg by mouth every 6 (six) hours as needed. (Patient not taking: Reported on 08/26/2023)     magnesium  hydroxide (MILK OF MAGNESIA) 400 MG/5ML suspension Take 5 mLs by mouth daily as needed. (Patient not taking: Reported on 08/26/2023)     meloxicam (MOBIC) 7.5 MG tablet Take 7.5 mg by mouth 2 (two) times daily. (Patient not taking: Reported on 08/26/2023)     No current facility-administered medications on file prior to visit.    Allergies  Allergen Reactions   Ciprofloxacin Hives   Clindamycin Hcl Hives   Iodinated Contrast Media Hives    Iohexol    Sulfa Antibiotics Hives   Ceftin  [Cefuroxime] Hives   Clarithromycin    Codeine    Cymbalta [Duloxetine Hcl]    Doxycycline      Pt states that this is not an allergy   Iohexol  Hives   Levofloxacin    Metoclopramide Hcl    Penicillin G    Penicillins     Social History   Substance and Sexual Activity  Alcohol Use No    Social History   Tobacco Use  Smoking Status Former   Current packs/day: 0.00   Average packs/day: 1 pack/day for 30.0 years (30.0 ttl pk-yrs)   Types: Cigarettes   Start date: 12/27/1963   Quit date: 12/26/1993   Years since quitting: 29.6  Smokeless Tobacco Never    Review of Systems  Constitutional: Negative.   HENT: Negative.    Eyes:  Negative.   Respiratory: Negative.    Cardiovascular: Negative.   Gastrointestinal:  Positive for abdominal pain.  Genitourinary: Negative.   Musculoskeletal:  Positive for back pain.  Skin: Negative.   Neurological: Negative.   Endo/Heme/Allergies: Negative.   Psychiatric/Behavioral: Negative.      Objective   Vitals:   08/26/23 1405  BP: 125/80  Pulse: 84  Resp: 14  Temp: 98.6 F (37 C)  SpO2: 94%    Physical Exam Vitals reviewed.  Constitutional:      Appearance: Normal appearance. She is normal weight. She is not ill-appearing.  HENT:     Head: Normocephalic and atraumatic.  Cardiovascular:     Rate and Rhythm: Normal rate and regular rhythm.     Heart sounds: Normal heart sounds. No murmur heard.    No friction rub. No gallop.  Pulmonary:     Effort: Pulmonary effort is normal. No respiratory distress.     Breath sounds: Normal breath sounds. No stridor. No wheezing, rhonchi or rales.  Abdominal:     General: Bowel sounds are normal. There is no distension.     Palpations: Abdomen is soft. There is no mass.     Tenderness: There is no abdominal tenderness. There is no guarding or rebound.     Hernia: A hernia is present.     Comments: Bilateral inguinal hernias, right greater than left.  Skin:    General: Skin is warm and dry.  Neurological:     Mental Status: She is alert and oriented to person, place, and time.     Assessment  Bilateral inguinal hernias, right greater than left Plan  Patient is scheduled for a robotic assisted laparoscopic bilateral inguinal herniorrhaphy with mesh on 03/24/2024.  The risks and benefits of the procedure including bleeding, infection, mesh use, and the possibility of recurrence of the hernia were fully explained to the patient, who gave informed consent.  She has requested no Foley in all female OR team.

## 2024-03-18 NOTE — Patient Instructions (Signed)
 "      Belinda Clark  03/18/2024     @PREFPERIOPPHARMACY @   Your procedure is scheduled on 03/24/2024.   Report to [facility] at  0730  A.M.   Call this number if you have problems the morning of surgery:  [phone]  If you experience any cold or flu symptoms such as cough, fever, chills, shortness of breath, etc. between now and your scheduled surgery, please notify us  at the above number.   Remember:  Do not eat after midnight.  You may drink clear liquids until 0530 am on 03/24/2024.      Clear liquids allowed are:                    Water, Carbonated beverages (diabetics please choose diet or no sugar options), Black Coffee Only (No creamer, milk or cream, including half & half and powdered creamer), and Clear Sports drink (No red color; diabetics please choose diet or no sugar options)    Take these medicines the morning of surgery with A SIP OF WATER        amlodipine , carvedilol , lorazepam  (if needed), omeprazole, synthroid .    Do not wear jewelry, make-up or nail polish, including gel polish,  artificial nails, or any other type of covering on natural nails (fingers and  toes).  Do not wear lotions, powders, or perfumes, or deodorant.  Do not shave 48 hours prior to surgery.  Men may shave face and neck.  Do not bring valuables to the hospital.  [facility] is not responsible for any belongings or valuables.  Contacts, dentures or bridgework may not be worn into surgery.  Leave your suitcase in the car.  After surgery it may be brought to your room.  For patients admitted to the hospital, discharge time will be determined by your treatment team.  Patients discharged the day of surgery will not be allowed to drive home and must have someone with them for 24 hours.    Special instructions:  DO NOT smoke tobacco or vape for 24 hours before your procedure.  Please read over the following fact sheets that you were given. Pain Booklet, Coughing and Deep Breathing,  Surgical Site Infection Prevention, Anesthesia Post-op Instructions, and Care and Recovery After Surgery        Laparoscopic Surgery for Groin Hernia in Adults: What to Know After After a laparoscopic surgery for groin hernia, it's common to have pain, discomfort, soreness, swelling, and bruising around the cuts that were made in the belly. There may also be swelling of the scrotum in males. Follow these instructions at home: Activity Rest as told. Get up and take short walks many times during the day. This helps you breathe better and keeps your blood flowing. Ask for help if you feel weak or unsteady. Ask if it's OK for you to lift. Do not take baths, swim, or use a hot tub until you're told it's OK. Ask if you can shower. Ask what things are safe for you to do at home. Ask when you can go back to work or school. Medicines Take your medicines only as told. You may need to take steps to help treat or prevent trouble pooping (constipation), such as: Taking medicine to help you poop. Eating foods high in fiber, like beans, whole grains, and fresh fruits and vegetables. Drinking more fluids as told. Ask your health care provider if it's safe to drive or use machines while taking your medicine. Wound care  Take care of the cuts in your belly as told. Make sure you: Wash your hands with soap and water for at least 20 seconds before and after you change your bandage. If you can't use soap and water, use hand sanitizer. Change your bandage. Leave stitches or skin glue alone. Leave tape strips alone unless you're told to take them off. You may trim the edges of the tape strips if they curl up. Check the cuts on your belly every day for signs of infection. Check for: More redness, swelling, or pain. More fluid or blood. Warmth. Pus or a bad smell. Pain management  Use ice or an ice pack as told. Place a towel between your skin and the ice. Leave the ice on for 20 minutes, 2-3 times a  day. If your skin turns red, take off the ice right away to prevent skin damage. The risk of damage is higher if you can't feel pain, heat, or cold. General instructions Do not smoke, vape, or use nicotine or tobacco. Doing this can slow healing. Wear compression stockings to reduce swelling and help prevent blood clots in your legs. You may be asked to continue to do deep breathing exercises at home. This will help to prevent a lung infection. Your provider may give you more instructions. Make sure you know what you can and can't do. Contact a health care provider if: You have any signs of infection. You have more swelling or pain in your scrotum. You have pain that gets worse or doesn't get better with medicine. You aren't able to pee. You haven't pooped in 3 days. You have a fever. You throw up or you feel like throwing up. Get help right away if: You have redness, warmth, or pain in your leg. You have chest pain. You have trouble breathing. You have very bad pain in your belly. You throw up each time you eat or drink. These symptoms may be an emergency. Call 911 right away. Do not wait to see if the symptoms will go away. Do not drive yourself to the hospital. This information is not intended to replace advice given to you by your health care provider. Make sure you discuss any questions you have with your health care provider. Document Revised: 11/26/2022 Document Reviewed: 11/26/2022 Elsevier Patient Education  2025 [employer].General Anesthesia, Adult, Care After The following information offers guidance on how to care for yourself after your procedure. Your health care provider may also give you more specific instructions. If you have problems or questions, contact your health care provider. What can I expect after the procedure? After the procedure, it is common for people to: Have pain or discomfort at the IV site. Have nausea or vomiting. Have a sore throat or  hoarseness. Have trouble concentrating. Feel cold or chills. Feel weak, sleepy, or tired (fatigue). Have soreness and body aches. These can affect parts of the body that were not involved in surgery. Follow these instructions at home: For the time period you were told by your health care provider:  Rest. Do not participate in activities where you could fall or become injured. Do not drive or use machinery. Do not drink alcohol. Do not take sleeping pills or medicines that cause drowsiness. Do not make important decisions or sign legal documents. Do not take care of children on your own. General instructions Drink enough fluid to keep your urine pale yellow. If you have sleep apnea, surgery and certain medicines can increase your risk for breathing  problems. Follow instructions from your health care provider about wearing your sleep device: Anytime you are sleeping, including during daytime naps. While taking prescription pain medicines, sleeping medicines, or medicines that make you drowsy. Return to your normal activities as told by your health care provider. Ask your health care provider what activities are safe for you. Take over-the-counter and prescription medicines only as told by your health care provider. Do not use any products that contain nicotine or tobacco. These products include cigarettes, chewing tobacco, and vaping devices, such as e-cigarettes. These can delay incision healing after surgery. If you need help quitting, ask your health care provider. Contact a health care provider if: You have nausea or vomiting that does not get better with medicine. You vomit every time you eat or drink. You have pain that does not get better with medicine. You cannot urinate or have bloody urine. You develop a skin rash. You have a fever. Get help right away if: You have trouble breathing. You have chest pain. You vomit blood. These symptoms may be an emergency. Get help right  away. Call 911. Do not wait to see if the symptoms will go away. Do not drive yourself to the hospital. Summary After the procedure, it is common to have a sore throat, hoarseness, nausea, vomiting, or to feel weak, sleepy, or fatigue. For the time period you were told by your health care provider, do not drive or use machinery. Get help right away if you have difficulty breathing, have chest pain, or vomit blood. These symptoms may be an emergency. This information is not intended to replace advice given to you by your health care provider. Make sure you discuss any questions you have with your health care provider. Document Revised: 05/11/2021 Document Reviewed: 05/11/2021 Elsevier Patient Education  2024 Else[employer] Inc.How to Use Chlorhexidine  at Home in the Shower Chlorhexidine  gluconate (CHG) is a germ-killing (antiseptic) wash that's used to clean the skin. It can get rid of the germs that normally live on the skin and can keep them away for about 24 hours. If you're having surgery, you may be told to shower with CHG at home the night before surgery. This can help lower your risk for infection. To use CHG wash in the shower, follow the steps below. Supplies needed: CHG body wash. Clean washcloth. Clean towel. How to use CHG in the shower Follow these steps unless you're told to use CHG in a different way: Start the shower. Use your normal soap and shampoo to wash your face and hair. Turn off the shower or move out of the shower stream. Pour CHG onto a clean washcloth. Do not use any type of brush or rough sponge. Start at your neck, washing your body down to your toes. Make sure you: Wash the part of your body where the surgery will be done for at least 1 minute. Do not scrub. Do not use CHG on your head or face unless your health care provider tells you to. If it gets into your ears or eyes, rinse them well with water. Do not wash your genitals with CHG. Wash your back and under your  arms. Make sure to wash skin folds. Let the CHG sit on your skin for 1-2 minutes or as long as told. Rinse your entire body in the shower, including all body creases and folds. Turn off the shower. Dry off with a clean towel. Do not put anything on your skin afterward, such as powder, lotion, or perfume.  Put on clean clothes or pajamas. If it's the night before surgery, sleep in clean sheets. General tips Use CHG only as told, and follow the instructions on the label. Use the full amount of CHG as told. This is often one bottle. Do not smoke and stay away from flames after using CHG. Your skin may feel sticky after using CHG. This is normal. The sticky feeling will go away as the CHG dries. Do not use CHG: If you have a chlorhexidine  allergy or have reacted to chlorhexidine  in the past. On open wounds or areas of skin that have broken skin, cuts, or scrapes. On babies younger than 83 months of age. Contact a health care provider if: You have questions about using CHG. Your skin gets irritated or itchy. You have a rash after using CHG. You swallow any CHG. Call your local poison control center [phone] in the [place] Your eyes itch badly, or they become very red or swollen. Your hearing changes. You have trouble seeing. If you can't reach your provider, go to an urgent care or emergency room. Do not drive yourself. Get help right away if: You have swelling or tingling in your mouth or throat. You make high-pitched whistling sounds when you breathe, most often when you breathe out (wheeze). You have trouble breathing. These symptoms may be an emergency. Call 911 right away. Do not wait to see if the symptoms will go away. Do not drive yourself to the hospital. This information is not intended to replace advice given to you by your health care provider. Make sure you discuss any questions you have with your health care provider. Document Revised: 08/27/2022 Document Reviewed:  08/23/2021 Elsevier Patient Education  2024 [employer]. "

## 2024-03-18 NOTE — Addendum Note (Signed)
 Addended by: SAUNDRA TAWNI DEL on: 03/18/2024 02:22 PM   Modules accepted: Orders

## 2024-03-18 NOTE — Progress Notes (Signed)
 Subjective:     Belinda Clark  Patient presents back to reschedule her surgery.  She continues to have pain in the right groin region. Objective:    BP (!) 140/82 (BP Location: Left Arm, Patient Position: Sitting, Cuff Size: Normal) Comment: BP elevated. Advised to F/U with PCP  Pulse 90   Temp 98.9 F (37.2 C) (Oral)   Resp (!) 22   Ht 5' 3 (1.6 m)   Wt 111 lb 12.8 oz (50.7 kg)   SpO2 96%   BMI 19.80 kg/m   General:  alert, cooperative, and no distress       Assessment:    Bilateral inguinal hernias    Plan:   Patient is scheduled for robotic assisted laparoscopic bilateral inguinal herniorrhaphy with mesh on 03/24/2024.  The risks and benefits of the procedure including bleeding, infection, mesh use, pain, and the possibility of recurrence of the hernias were fully explained to the patient, who gave informed consent.

## 2024-03-23 ENCOUNTER — Other Ambulatory Visit: Payer: Self-pay

## 2024-03-23 ENCOUNTER — Encounter (HOSPITAL_COMMUNITY): Payer: Self-pay

## 2024-03-23 ENCOUNTER — Encounter (HOSPITAL_COMMUNITY)
Admission: RE | Admit: 2024-03-23 | Discharge: 2024-03-23 | Disposition: A | Source: Ambulatory Visit | Attending: General Surgery

## 2024-03-23 DIAGNOSIS — Z01812 Encounter for preprocedural laboratory examination: Secondary | ICD-10-CM | POA: Insufficient documentation

## 2024-03-23 DIAGNOSIS — Z01818 Encounter for other preprocedural examination: Secondary | ICD-10-CM

## 2024-03-23 LAB — BASIC METABOLIC PANEL WITH GFR
Anion gap: 12 (ref 5–15)
BUN: 13 mg/dL (ref 8–23)
CO2: 23 mmol/L (ref 22–32)
Calcium: 9.3 mg/dL (ref 8.9–10.3)
Chloride: 102 mmol/L (ref 98–111)
Creatinine, Ser: 0.5 mg/dL (ref 0.44–1.00)
GFR, Estimated: >60 mL/min
Glucose, Bld: 104 mg/dL — ABNORMAL HIGH (ref 70–99)
Potassium: 4 mmol/L (ref 3.5–5.1)
Sodium: 138 mmol/L (ref 135–145)

## 2024-03-23 LAB — CBC WITH DIFFERENTIAL/PLATELET
Abs Immature Granulocytes: 0.01 10*3/uL (ref 0.00–0.07)
Basophils Absolute: 0 10*3/uL (ref 0.0–0.1)
Basophils Relative: 1 %
Eosinophils Absolute: 0.2 10*3/uL (ref 0.0–0.5)
Eosinophils Relative: 3 %
HCT: 42.9 % (ref 36.0–46.0)
Hemoglobin: 13.6 g/dL (ref 12.0–15.0)
Immature Granulocytes: 0 %
Lymphocytes Relative: 22 %
Lymphs Abs: 1.2 10*3/uL (ref 0.7–4.0)
MCH: 29.2 pg (ref 26.0–34.0)
MCHC: 31.7 g/dL (ref 30.0–36.0)
MCV: 92.1 fL (ref 80.0–100.0)
Monocytes Absolute: 0.5 10*3/uL (ref 0.1–1.0)
Monocytes Relative: 9 %
Neutro Abs: 3.6 10*3/uL (ref 1.7–7.7)
Neutrophils Relative %: 65 %
Platelets: 192 10*3/uL (ref 150–400)
RBC: 4.66 MIL/uL (ref 3.87–5.11)
RDW: 12.8 % (ref 11.5–15.5)
WBC: 5.5 10*3/uL (ref 4.0–10.5)
nRBC: 0 % (ref 0.0–0.2)

## 2024-03-23 NOTE — Pre-Procedure Instructions (Signed)
 Patient in for pre-op. She is extremely nervous. She states she has trauma and I do not want a catheter or anyone female in my room. Dr Mavis is aware of this and I need you to check that he has put this information in my chart. I checked and this was in his history. She told me that she was going to shave herself. I explained to her when she cannot use a razor due to possibility of introducing infection into the site. She states she will use an neurosurgeon. I spoke to her a length as to why our staff  needed to do this and she stated she is going to do it anyway. She was very adamate about above information. I placed a note on her chart as well. She wants something for her nerves as soon as I get here. Explained to her we could give her medications once we start her IV and see anesthesia. She also requests that we either put her dentures back in or a mask on her face before she is discharged so no one sees her. I also placed a note on the chart about this.

## 2024-03-24 ENCOUNTER — Ambulatory Visit (HOSPITAL_COMMUNITY): Admitting: Anesthesiology

## 2024-03-24 ENCOUNTER — Ambulatory Visit (HOSPITAL_COMMUNITY)
Admission: RE | Admit: 2024-03-24 | Discharge: 2024-03-24 | Disposition: A | Attending: General Surgery | Admitting: General Surgery

## 2024-03-24 ENCOUNTER — Encounter (HOSPITAL_COMMUNITY): Payer: Self-pay | Admitting: General Surgery

## 2024-03-24 ENCOUNTER — Encounter (HOSPITAL_COMMUNITY)
Admission: RE | Disposition: A | Discharge status: Home / Self Care | Payer: Self-pay | Source: Home / Self Care | Attending: General Surgery

## 2024-03-24 DIAGNOSIS — E039 Hypothyroidism, unspecified: Secondary | ICD-10-CM | POA: Diagnosis not present

## 2024-03-24 DIAGNOSIS — J45909 Unspecified asthma, uncomplicated: Secondary | ICD-10-CM | POA: Insufficient documentation

## 2024-03-24 DIAGNOSIS — K219 Gastro-esophageal reflux disease without esophagitis: Secondary | ICD-10-CM | POA: Diagnosis not present

## 2024-03-24 DIAGNOSIS — Z87891 Personal history of nicotine dependence: Secondary | ICD-10-CM | POA: Diagnosis not present

## 2024-03-24 DIAGNOSIS — Z01818 Encounter for other preprocedural examination: Secondary | ICD-10-CM

## 2024-03-24 DIAGNOSIS — K4020 Bilateral inguinal hernia, without obstruction or gangrene, not specified as recurrent: Secondary | ICD-10-CM

## 2024-03-24 DIAGNOSIS — I10 Essential (primary) hypertension: Secondary | ICD-10-CM | POA: Diagnosis not present

## 2024-03-24 DIAGNOSIS — I2510 Atherosclerotic heart disease of native coronary artery without angina pectoris: Secondary | ICD-10-CM | POA: Insufficient documentation

## 2024-03-24 MED ORDER — CHLORHEXIDINE GLUCONATE CLOTH 2 % EX PADS: 6.0000 | MEDICATED_PAD | Freq: Once | CUTANEOUS | Status: DC

## 2024-03-24 MED ORDER — FENTANYL CITRATE (PF) 100 MCG/2ML IJ SOLN
INTRAMUSCULAR | Status: AC
  Filled 2024-03-24: qty 2

## 2024-03-24 MED ORDER — STERILE WATER FOR IRRIGATION IR SOLN
Status: DC | PRN
  Administered 2024-03-24: 1000 mL

## 2024-03-24 MED ORDER — PROPOFOL 10 MG/ML IV BOLUS
INTRAVENOUS | Status: DC | PRN
  Administered 2024-03-24: 100 mg via INTRAVENOUS

## 2024-03-24 MED ORDER — DEXAMETHASONE SOD PHOSPHATE PF 10 MG/ML IJ SOLN
INTRAMUSCULAR | Status: DC | PRN
  Administered 2024-03-24: 8 mg via INTRAVENOUS

## 2024-03-24 MED ORDER — DEXAMETHASONE SOD PHOSPHATE PF 10 MG/ML IJ SOLN
INTRAMUSCULAR | Status: AC
  Filled 2024-03-24: qty 1

## 2024-03-24 MED ORDER — OXYCODONE HCL 5 MG PO TABS: 5.0000 mg | ORAL_TABLET | Freq: Once | ORAL | Status: AC | PRN

## 2024-03-24 MED ORDER — LACTATED RINGERS IV SOLN: INTRAVENOUS | Status: DC

## 2024-03-24 MED ORDER — CEFAZOLIN SODIUM-DEXTROSE 2-4 GM/100ML-% IV SOLN
2.0000 g | INTRAVENOUS | Status: AC
  Administered 2024-03-24: 2 g via INTRAVENOUS
  Filled 2024-03-24: qty 100

## 2024-03-24 MED ORDER — ONDANSETRON HCL 4 MG/2ML IJ SOLN
INTRAMUSCULAR | Status: DC | PRN
  Administered 2024-03-24: 4 mg via INTRAVENOUS

## 2024-03-24 MED ORDER — ONDANSETRON HCL 4 MG/2ML IJ SOLN
INTRAMUSCULAR | Status: AC
  Filled 2024-03-24: qty 4

## 2024-03-24 MED ORDER — LIDOCAINE 2% (20 MG/ML) 5 ML SYRINGE
INTRAMUSCULAR | Status: AC
  Filled 2024-03-24: qty 5

## 2024-03-24 MED ORDER — ONDANSETRON HCL 4 MG/2ML IJ SOLN: 4.0000 mg | Freq: Once | INTRAMUSCULAR | Status: DC | PRN

## 2024-03-24 MED ORDER — FENTANYL CITRATE (PF) 100 MCG/2ML IJ SOLN
INTRAMUSCULAR | Status: DC | PRN
  Administered 2024-03-24 (×2): 50 ug via INTRAVENOUS

## 2024-03-24 MED ORDER — EPHEDRINE SULFATE (PRESSORS) 25 MG/5ML IV SOSY
PREFILLED_SYRINGE | INTRAVENOUS | Status: DC | PRN
  Administered 2024-03-24 (×2): 10 mg via INTRAVENOUS

## 2024-03-24 MED ORDER — SUGAMMADEX SODIUM 200 MG/2ML IV SOLN
INTRAVENOUS | Status: DC | PRN
  Administered 2024-03-24: 100 mg via INTRAVENOUS

## 2024-03-24 MED ORDER — ROCURONIUM BROMIDE 10 MG/ML (PF) SYRINGE
PREFILLED_SYRINGE | INTRAVENOUS | Status: AC
  Filled 2024-03-24: qty 10

## 2024-03-24 MED ORDER — PHENYLEPHRINE HCL (PRESSORS) 10 MG/ML IV SOLN
INTRAVENOUS | Status: DC | PRN
  Administered 2024-03-24 (×2): 80 ug via INTRAVENOUS

## 2024-03-24 MED ORDER — BUPIVACAINE HCL (PF) 0.5 % IJ SOLN
INTRAMUSCULAR | Status: AC
  Filled 2024-03-24: qty 30

## 2024-03-24 MED ORDER — MIDAZOLAM HCL (PF) 2 MG/2ML IJ SOLN
1.0000 mg | INTRAMUSCULAR | Status: AC
  Administered 2024-03-24: 0.5 mg via INTRAVENOUS
  Administered 2024-03-24: 1 mg via INTRAVENOUS

## 2024-03-24 MED ORDER — BUPIVACAINE HCL (PF) 0.5 % IJ SOLN
INTRAMUSCULAR | Status: DC | PRN
  Administered 2024-03-24: 30 mL

## 2024-03-24 MED ORDER — MIDAZOLAM HCL 2 MG/2ML IJ SOLN
INTRAMUSCULAR | Status: AC
  Filled 2024-03-24: qty 2

## 2024-03-24 MED ORDER — LIDOCAINE HCL (CARDIAC) PF 50 MG/5ML IV SOSY
PREFILLED_SYRINGE | INTRAVENOUS | Status: DC | PRN
  Administered 2024-03-24: 75 mg via INTRAVENOUS

## 2024-03-24 MED ORDER — FENTANYL CITRATE (PF) 50 MCG/ML IJ SOSY
25.0000 ug | PREFILLED_SYRINGE | INTRAMUSCULAR | Status: DC | PRN
  Administered 2024-03-24: 25 ug via INTRAVENOUS
  Administered 2024-03-24 (×2): 50 ug via INTRAVENOUS
  Administered 2024-03-24: 25 ug via INTRAVENOUS
  Filled 2024-03-24 (×2): qty 1

## 2024-03-24 MED ORDER — ACETAMINOPHEN 500 MG PO TABS
1000.0000 mg | ORAL_TABLET | ORAL | Status: AC
  Administered 2024-03-24: 1000 mg via ORAL
  Filled 2024-03-24: qty 2

## 2024-03-24 MED ORDER — CHLORHEXIDINE GLUCONATE 0.12 % MT SOLN
15.0000 mL | Freq: Once | OROMUCOSAL | Status: AC
  Administered 2024-03-24: 15 mL via OROMUCOSAL

## 2024-03-24 MED ORDER — ROCURONIUM 10MG/ML (10ML) SYRINGE FOR MEDFUSION PUMP - OPTIME
INTRAVENOUS | Status: DC | PRN
  Administered 2024-03-24: 40 mg via INTRAVENOUS

## 2024-03-24 MED ORDER — ORAL CARE MOUTH RINSE: 15.0000 mL | Freq: Once | OROMUCOSAL | Status: AC

## 2024-03-24 MED ORDER — OXYCODONE HCL 5 MG/5ML PO SOLN
5.0000 mg | Freq: Once | ORAL | Status: AC | PRN
  Administered 2024-03-24: 5 mg via ORAL
  Filled 2024-03-24: qty 5

## 2024-03-24 MED ORDER — HYDROCODONE-ACETAMINOPHEN 5-325 MG PO TABS: 1.0000 | ORAL_TABLET | Freq: Four times a day (QID) | ORAL | 0 refills | Status: AC | PRN

## 2024-03-24 NOTE — Anesthesia Postprocedure Evaluation (Signed)
"   Anesthesia Post Note  Patient: Belinda Clark  Procedure(s) Performed: REPAIR WITH MESH, HERNIA, INGUINAL, BILATERAL, ROBOT-ASSISTED (Bilateral: Inguinal)  Patient location during evaluation: Phase II Anesthesia Type: MAC Level of consciousness: awake Pain management: pain level controlled Vital Signs Assessment: post-procedure vital signs reviewed and stable Respiratory status: spontaneous breathing and respiratory function stable Cardiovascular status: blood pressure returned to baseline and stable Postop Assessment: no headache and no apparent nausea or vomiting Anesthetic complications: no Comments: Late entry   No notable events documented.   Last Vitals:  Vitals:   03/24/24 1238 03/24/24 1245  BP:  123/73  Pulse: 85 82  Resp: 15 17  Temp:  36.8 C  SpO2: 91% 94%    Last Pain:  Vitals:   03/24/24 1303  TempSrc:   PainSc: 7                  Yvonna PARAS Endi Lagman      "

## 2024-03-24 NOTE — Op Note (Signed)
 Patient:  Belinda Clark  DOB:  11-12-1940  MRN:  [id number]   Preop Diagnosis: Bilateral inguinal hernias  Postop Diagnosis: Same  Procedure: Robotic assisted laparoscopic bilateral inguinal herniorrhaphy with mesh  Surgeon: Oneil Budge, MD  Anes: General Endotracheal  Indications: Patient is an 84 year old white female who presents with symptomatic bilateral inguinal hernias.  The risks and benefits of both procedures including bleeding, infection, mesh use, and the possibility of recurrence of the hernias were fully explained to the patient, who gave informed consent.  Procedure note: The patient was placed in the supine position.  After induction of general endotracheal anesthesia, the abdomen was prepped and draped using the usual sterile technique with ChloraPrep.  Surgical site confirmation was performed.  An incision was made in the left upper quadrant at Palmer's point.  A Veress needle was introduced into the abdominal cavity and confirmation of placement was done using the saline drop test.  The abdomen was then insufflated to 15 mmHg pressure.  An 8 mm trocar was introduced into the abdominal cavity under direct visualization without difficulty.  Additional 8 mm trocars were placed in the upper midline and right upper quadrant regions.  The patient was placed in Trendelenburg position.  The robot was then docked and targeted.  The patient was noted to have a direct left inguinal hernia and indirect right inguinal hernia.  I first turned my attention to the left inguinal hernia.  The peritoneal flap was then formed down to Cooper's ligament.  The dissection was taken 2 cm over the midline.  The flap was then extended out laterally.  The direct hernia sac was excised and reduced.  The processes vaginalis was cauterized and divided.  A large Bard 3D max mesh was then inserted and secured to Cooper's ligament using a 2-0 Vicryl interrupted suture.  An additional 2-0 Vicryl suture was  placed superiorly and laterally to fixate the mesh to the anterior abdominal wall.  The peritoneal flap was then closed using a 3-0 STRATAFIX running suture.  I then proceeded with a right inguinal hernia.  A peritoneal flap was formed lateral to medial.  This was taken down to Cooper's ligament.  The dissection did overlap the previously placed mesh.  The posterior flap was then taken out laterally.  The indirect hernia was reduced and the processes vaginalis was divided.  A large Bard 3D max mesh was then inserted and secured to Cooper's ligament using a 2-0 Vicryl interrupted suture.  Another fixation suture was placed anteriorly and laterally to the anterior abdominal wall.  The peritoneal flap was then closed using a 3-0 strata fix running suture.  Air was evacuated from the preperitoneal space and good approximation of both meshes was noted.  The robot was undocked and all air was evacuated from the abdominal cavity prior to the removal of the trocars.  All wounds were irrigated with normal saline.  All wounds were injected with 0.5% Sensorcaine .  All incisions were closed using a 4-0 Monocryl subcuticular suture.  Dermabond was applied.  All tape and needle counts were correct at the end of the procedure.  The patient was extubated in the operating room and transferred to PACU in stable condition.  Complications: None  EBL: Minimal  Specimen: None

## 2024-03-24 NOTE — Progress Notes (Signed)
 Pt stated that she had not taken her lorazepam  this morning and wished that she had because she was very nervous. Relayed that information to Dr Kendell who ordered Versed  to help while she is waiting to go to OR. Pt received Versed  0.5 mg. She is resting comfortably with her fiancee at the bedside.

## 2024-03-24 NOTE — Progress Notes (Signed)
Dentures given back to patient.

## 2024-03-24 NOTE — Anesthesia Preprocedure Evaluation (Signed)
"                                    Anesthesia Evaluation  Patient identified by MRN, date of birth, ID band Patient awake    Reviewed: Allergy & Precautions, H&P , NPO status , Patient's Chart, lab work & pertinent test results, reviewed documented beta blocker date and time   History of Anesthesia Complications (+) PONV and history of anesthetic complications  Airway Mallampati: II  TM Distance: >3 FB Neck ROM: full    Dental no notable dental hx.    Pulmonary asthma , former smoker   Pulmonary exam normal breath sounds clear to auscultation       Cardiovascular Exercise Tolerance: Good hypertension, + CAD and + Peripheral Vascular Disease   Rhythm:regular Rate:Normal     Neuro/Psych   Anxiety     negative neurological ROS  negative psych ROS   GI/Hepatic Neg liver ROS,GERD  ,,  Endo/Other  Hypothyroidism    Renal/GU negative Renal ROS  negative genitourinary   Musculoskeletal   Abdominal   Peds  Hematology negative hematology ROS (+)   Anesthesia Other Findings   Reproductive/Obstetrics negative OB ROS                              Anesthesia Physical Anesthesia Plan  ASA: 2  Anesthesia Plan: MAC   Post-op Pain Management:    Induction:   PONV Risk Score and Plan: Propofol  infusion  Airway Management Planned:   Additional Equipment:   Intra-op Plan:   Post-operative Plan:   Informed Consent: I have reviewed the patients History and Physical, chart, labs and discussed the procedure including the risks, benefits and alternatives for the proposed anesthesia with the patient or authorized representative who has indicated his/her understanding and acceptance.     Dental Advisory Given  Plan Discussed with: CRNA  Anesthesia Plan Comments:         Anesthesia Quick Evaluation  "

## 2024-03-24 NOTE — Interval H&P Note (Signed)
 History and Physical Interval Note:  03/24/2024 9:38 AM  Belinda Clark  has presented today for surgery, with the diagnosis of INGUINAL HERNIA BILATERAL.  The various methods of treatment have been discussed with the patient and family. After consideration of risks, benefits and other options for treatment, the patient has consented to  Procedures: REPAIR, HERNIA, INGUINAL, BILATERAL, ROBOT-ASSISTED (Bilateral) as a surgical intervention.  The patient's history has been reviewed, patient examined, no change in status, stable for surgery.  I have reviewed the patient's chart and labs.  Questions were answered to the patient's satisfaction.     Belinda Clark

## 2024-03-24 NOTE — Transfer of Care (Signed)
 Immediate Anesthesia Transfer of Care Note  Patient: Belinda Clark  Procedure(s) Performed: REPAIR WITH MESH, HERNIA, INGUINAL, BILATERAL, ROBOT-ASSISTED (Bilateral: Inguinal)  Patient Location: PACU  Anesthesia Type:General  Level of Consciousness: awake  Airway & Oxygen Therapy: Patient Spontanous Breathing  Post-op Assessment: Report given to RN  Post vital signs: Reviewed and stable  Last Vitals:  Vitals Value Taken Time  BP 137/74 03/24/24 11:24  Temp 36.8 C 03/24/24 11:24  Pulse 86 03/24/24 11:28  Resp 18 03/24/24 11:28  SpO2 96 % 03/24/24 11:28  Vitals shown include unfiled device data.  Last Pain:  Vitals:   03/24/24 0801  TempSrc: Oral  PainSc: 0-No pain      Patients Stated Pain Goal: 9 (03/24/24 0801)  Complications: No notable events documented.

## 2024-03-24 NOTE — Anesthesia Procedure Notes (Signed)
 Procedure Name: Intubation Date/Time: 03/24/2024 9:57 AM  Performed by: Toribio Darice BRAVO, CRNAPre-anesthesia Checklist: Patient identified, Patient being monitored, Timeout performed, Emergency Drugs available and Suction available Patient Re-evaluated:Patient Re-evaluated prior to induction Oxygen Delivery Method: Circle system utilized Preoxygenation: Pre-oxygenation with 100% oxygen Induction Type: IV induction Ventilation: Mask ventilation without difficulty Laryngoscope Size: Mac and 3 Grade View: Grade I Tube type: Oral Tube size: 7.0 mm Number of attempts: 1 Airway Equipment and Method: Stylet Placement Confirmation: ETT inserted through vocal cords under direct vision, positive ETCO2 and breath sounds checked- equal and bilateral Secured at: 21 cm Tube secured with: Tape Dental Injury: Teeth and Oropharynx as per pre-operative assessment

## 2024-03-24 NOTE — Progress Notes (Signed)
 Patient states she feels weak and sleepy.

## 2024-03-25 ENCOUNTER — Encounter (HOSPITAL_COMMUNITY): Payer: Self-pay | Admitting: General Surgery

## 2024-03-30 ENCOUNTER — Ambulatory Visit: Payer: Self-pay

## 2024-04-01 ENCOUNTER — Telehealth (INDEPENDENT_AMBULATORY_CARE_PROVIDER_SITE_OTHER): Admitting: General Surgery

## 2024-04-01 DIAGNOSIS — Z09 Encounter for follow-up examination after completed treatment for conditions other than malignant neoplasm: Secondary | ICD-10-CM

## 2024-04-01 NOTE — Telephone Encounter (Signed)
 Called patient from my office.  She states she is having some pain in the right groin region.  Does have bruising in the lower abdomen.  I told her this was no unexpected.  She understands.  May use ice packs as needed.  Will see me next week as previously scheduled.  As this was a part of the total global surgical fee, this was not a billable visit.  Total telephone time was 4 minutes.

## 2024-04-08 ENCOUNTER — Encounter: Admitting: General Surgery

## 2024-05-24 ENCOUNTER — Ambulatory Visit: Admitting: Nurse Practitioner
